# Patient Record
Sex: Female | Born: 1945 | Race: White | Hispanic: No | Marital: Married
Health system: Midwestern US, Community
[De-identification: ages and names within clinical notes are randomized; demographics above are authoritative.]

## PROBLEM LIST (undated history)

## (undated) DIAGNOSIS — C649 Malignant neoplasm of unspecified kidney, except renal pelvis: Secondary | ICD-10-CM

## (undated) DIAGNOSIS — N200 Calculus of kidney: Secondary | ICD-10-CM

## (undated) DIAGNOSIS — E042 Nontoxic multinodular goiter: Secondary | ICD-10-CM

## (undated) DIAGNOSIS — E139 Other specified diabetes mellitus without complications: Secondary | ICD-10-CM

## (undated) DIAGNOSIS — N3941 Urge incontinence: Principal | ICD-10-CM

## (undated) DIAGNOSIS — L659 Nonscarring hair loss, unspecified: Secondary | ICD-10-CM

## (undated) DIAGNOSIS — N644 Mastodynia: Secondary | ICD-10-CM

## (undated) DIAGNOSIS — R4589 Other symptoms and signs involving emotional state: Principal | ICD-10-CM

## (undated) DIAGNOSIS — E782 Mixed hyperlipidemia: Secondary | ICD-10-CM

## (undated) DIAGNOSIS — Z1231 Encounter for screening mammogram for malignant neoplasm of breast: Secondary | ICD-10-CM

## (undated) DIAGNOSIS — I10 Essential (primary) hypertension: Principal | ICD-10-CM

## (undated) DIAGNOSIS — I714 Abdominal aortic aneurysm, without rupture, unspecified: Secondary | ICD-10-CM

## (undated) DIAGNOSIS — F32A Depression, unspecified: Secondary | ICD-10-CM

## (undated) DIAGNOSIS — N649 Disorder of breast, unspecified: Secondary | ICD-10-CM

## (undated) DIAGNOSIS — Z13820 Encounter for screening for osteoporosis: Secondary | ICD-10-CM

## (undated) DIAGNOSIS — G44209 Tension-type headache, unspecified, not intractable: Secondary | ICD-10-CM

## (undated) DIAGNOSIS — E039 Hypothyroidism, unspecified: Secondary | ICD-10-CM

## (undated) DIAGNOSIS — Z136 Encounter for screening for cardiovascular disorders: Secondary | ICD-10-CM

## (undated) DIAGNOSIS — F419 Anxiety disorder, unspecified: Secondary | ICD-10-CM

## (undated) DIAGNOSIS — R109 Unspecified abdominal pain: Secondary | ICD-10-CM

## (undated) DIAGNOSIS — N390 Urinary tract infection, site not specified: Secondary | ICD-10-CM

## (undated) DIAGNOSIS — E119 Type 2 diabetes mellitus without complications: Principal | ICD-10-CM

## (undated) DIAGNOSIS — N6452 Nipple discharge: Principal | ICD-10-CM

## (undated) DIAGNOSIS — K2100 Gastro-esophageal reflux disease with esophagitis, without bleeding: Secondary | ICD-10-CM

## (undated) DIAGNOSIS — E0801 Diabetes mellitus due to underlying condition with hyperosmolarity with coma: Principal | ICD-10-CM

## (undated) DIAGNOSIS — R071 Chest pain on breathing: Secondary | ICD-10-CM

## (undated) DIAGNOSIS — F329 Major depressive disorder, single episode, unspecified: Secondary | ICD-10-CM

## (undated) DIAGNOSIS — R1011 Right upper quadrant pain: Secondary | ICD-10-CM

## (undated) DIAGNOSIS — I7143 Infrarenal abdominal aortic aneurysm, without rupture: Secondary | ICD-10-CM

## (undated) DIAGNOSIS — Z9889 Other specified postprocedural states: Principal | ICD-10-CM

## (undated) DIAGNOSIS — R52 Pain, unspecified: Secondary | ICD-10-CM

## (undated) DIAGNOSIS — E559 Vitamin D deficiency, unspecified: Secondary | ICD-10-CM

## (undated) DIAGNOSIS — R0602 Shortness of breath: Secondary | ICD-10-CM

## (undated) DIAGNOSIS — Z1211 Encounter for screening for malignant neoplasm of colon: Secondary | ICD-10-CM

## (undated) DIAGNOSIS — Z139 Encounter for screening, unspecified: Secondary | ICD-10-CM

## (undated) DIAGNOSIS — M549 Dorsalgia, unspecified: Secondary | ICD-10-CM

## (undated) DIAGNOSIS — R002 Palpitations: Principal | ICD-10-CM

## (undated) DIAGNOSIS — R609 Edema, unspecified: Secondary | ICD-10-CM

## (undated) DIAGNOSIS — K589 Irritable bowel syndrome without diarrhea: Principal | ICD-10-CM

## (undated) DIAGNOSIS — E789 Disorder of lipoprotein metabolism, unspecified: Secondary | ICD-10-CM

## (undated) HISTORY — DX: Calculus of kidney: N20.0

## (undated) HISTORY — PX: OTHER SURGICAL HISTORY: SHX169

## (undated) HISTORY — DX: Nontoxic multinodular goiter: E04.2

## (undated) HISTORY — PX: LITHOTRIPSY: SUR834

## (undated) HISTORY — DX: Malignant neoplasm of unspecified kidney, except renal pelvis: C64.9

## (undated) HISTORY — PX: LAPAROSCOPIC CHOLECYSTECTOMY: SUR755

## (undated) SURGERY — OPEN REDUCTION INTERNAL FIXATION (ORIF) ANKLE FRACTURE
Anesthesia: General | Laterality: Left

---

## 1998-04-19 ENCOUNTER — Inpatient Hospital Stay (HOSPITAL_COMMUNITY): Admission: RE | Admit: 1998-04-19 | Discharge: 1998-04-21 | Payer: Self-pay | Admitting: Obstetrics and Gynecology

## 1999-04-02 ENCOUNTER — Other Ambulatory Visit: Admission: RE | Admit: 1999-04-02 | Discharge: 1999-04-02 | Payer: Self-pay | Admitting: Obstetrics and Gynecology

## 2000-04-16 ENCOUNTER — Other Ambulatory Visit: Admission: RE | Admit: 2000-04-16 | Discharge: 2000-04-16 | Payer: Self-pay | Admitting: Obstetrics and Gynecology

## 2000-05-14 ENCOUNTER — Encounter: Admission: RE | Admit: 2000-05-14 | Discharge: 2000-05-14 | Payer: Self-pay | Admitting: Obstetrics and Gynecology

## 2000-05-14 ENCOUNTER — Encounter: Payer: Self-pay | Admitting: Obstetrics and Gynecology

## 2001-05-11 ENCOUNTER — Other Ambulatory Visit: Admission: RE | Admit: 2001-05-11 | Discharge: 2001-05-11 | Payer: Self-pay | Admitting: Obstetrics and Gynecology

## 2001-05-29 ENCOUNTER — Encounter: Payer: Self-pay | Admitting: Obstetrics and Gynecology

## 2001-05-29 ENCOUNTER — Encounter: Admission: RE | Admit: 2001-05-29 | Discharge: 2001-05-29 | Payer: Self-pay | Admitting: Obstetrics and Gynecology

## 2001-09-26 ENCOUNTER — Emergency Department (HOSPITAL_COMMUNITY): Admission: EM | Admit: 2001-09-26 | Discharge: 2001-09-26 | Payer: Self-pay

## 2001-10-02 ENCOUNTER — Observation Stay (HOSPITAL_COMMUNITY): Admission: RE | Admit: 2001-10-02 | Discharge: 2001-10-03 | Payer: Self-pay | Admitting: *Deleted

## 2001-10-02 ENCOUNTER — Encounter (INDEPENDENT_AMBULATORY_CARE_PROVIDER_SITE_OTHER): Payer: Self-pay | Admitting: Specialist

## 2002-07-05 ENCOUNTER — Encounter: Admission: RE | Admit: 2002-07-05 | Discharge: 2002-07-05 | Payer: Self-pay | Admitting: Obstetrics and Gynecology

## 2002-07-05 ENCOUNTER — Encounter: Payer: Self-pay | Admitting: Obstetrics and Gynecology

## 2003-09-07 ENCOUNTER — Encounter: Admission: RE | Admit: 2003-09-07 | Discharge: 2003-09-07 | Payer: Self-pay | Admitting: Obstetrics and Gynecology

## 2005-02-13 ENCOUNTER — Encounter: Admission: RE | Admit: 2005-02-13 | Discharge: 2005-02-13 | Payer: Self-pay | Admitting: Obstetrics and Gynecology

## 2005-02-20 ENCOUNTER — Encounter (INDEPENDENT_AMBULATORY_CARE_PROVIDER_SITE_OTHER): Payer: Self-pay | Admitting: *Deleted

## 2005-02-20 ENCOUNTER — Encounter: Admission: RE | Admit: 2005-02-20 | Discharge: 2005-02-20 | Payer: Self-pay | Admitting: Obstetrics and Gynecology

## 2005-02-20 HISTORY — PX: BREAST BIOPSY: SHX20

## 2006-04-03 ENCOUNTER — Encounter: Admission: RE | Admit: 2006-04-03 | Discharge: 2006-04-03 | Payer: Self-pay | Admitting: Obstetrics and Gynecology

## 2007-05-28 ENCOUNTER — Encounter: Admission: RE | Admit: 2007-05-28 | Discharge: 2007-05-28 | Payer: Self-pay | Admitting: Obstetrics and Gynecology

## 2008-06-28 ENCOUNTER — Encounter: Admission: RE | Admit: 2008-06-28 | Discharge: 2008-06-28 | Payer: Self-pay | Admitting: Gynecology

## 2009-12-25 ENCOUNTER — Ambulatory Visit (HOSPITAL_COMMUNITY): Admission: RE | Admit: 2009-12-25 | Discharge: 2009-12-25 | Payer: Self-pay | Admitting: Urology

## 2010-02-09 ENCOUNTER — Inpatient Hospital Stay (HOSPITAL_COMMUNITY): Admission: EM | Admit: 2010-02-09 | Discharge: 2010-02-09 | Payer: Self-pay | Admitting: Emergency Medicine

## 2010-02-12 ENCOUNTER — Inpatient Hospital Stay (HOSPITAL_COMMUNITY): Admission: RE | Admit: 2010-02-12 | Discharge: 2010-02-14 | Payer: Self-pay | Admitting: Urology

## 2010-02-12 ENCOUNTER — Encounter (INDEPENDENT_AMBULATORY_CARE_PROVIDER_SITE_OTHER): Payer: Self-pay | Admitting: Urology

## 2010-09-05 ENCOUNTER — Ambulatory Visit (HOSPITAL_COMMUNITY): Admission: RE | Admit: 2010-09-05 | Discharge: 2010-09-05 | Payer: Self-pay | Admitting: Urology

## 2010-11-18 ENCOUNTER — Encounter: Payer: Self-pay | Admitting: Gynecology

## 2010-12-14 LAB — LIPID PANEL
Chol/HDL Ratio: 6.3 — ABNORMAL HIGH (ref <5.0)
Cholesterol: 227 mg/dL — ABNORMAL HIGH (ref <200)
HDL: 36 mg/dL — ABNORMAL LOW (ref >40)
LDL Cholesterol: 129 mg/dL — ABNORMAL HIGH (ref <100)
Triglycerides: 310 mg/dL — ABNORMAL HIGH (ref <150)

## 2010-12-14 LAB — CBC
Hematocrit: 41.5 % (ref 36–46)
Hemoglobin: 13.7 g/dL (ref 12.0–16.0)
MCH: 28.9 pg (ref 26–34)
MCHC: 33.1 g/dL (ref 31–37)
MCV: 87.3 fL (ref 80–100)
MPV: 9.1 fL (ref 6.0–12.0)
Platelet Count: 264 10*3/uL (ref 140–450)
RBC: 4.75 m/uL (ref 4.0–5.2)
RDW: 14.5 % (ref 12.5–15.4)
WBC: 6.4 10*3/uL (ref 3.5–11.0)

## 2010-12-14 LAB — COMPREHENSIVE METABOLIC PANEL
ALT: 18 U/L (ref 4–40)
AST: 16 U/L (ref 8–36)
Albumin: 4.2 g/dL (ref 3.4–4.8)
Alkaline Phosphatase: 96 U/L (ref 25–100)
Anion Gap: 11 mmol/L (ref 8–16)
BUN: 16 mg/dL (ref 5–20)
CO2: 32 mmol/L — ABNORMAL HIGH (ref 20–31)
Calcium: 9.6 mg/dL (ref 8.6–10.4)
Chloride: 104 mmol/L (ref 98–110)
Creatinine: 0.7 mg/dL (ref 0.4–1.0)
GFR African American: >60 mL/min (ref >60)
GFR Non-African American: >60 mL/min (ref >60)
Glucose: 165 mg/dL — ABNORMAL HIGH (ref 74–106)
Potassium: 5.1 mmol/L (ref 3.5–5.1)
Protein, Total: 7.3 g/dL (ref 6.4–8.3)
Sodium: 142 mmol/L (ref 136–145)
Total Bilirubin: 0.4 mg/dL (ref 0.30–1.20)

## 2010-12-14 LAB — TSH: TSH: 1.75 mIU/L (ref 0.35–5.50)

## 2010-12-14 LAB — AMYLASE: Amylase: 24 U/L (ref 20–104)

## 2010-12-14 LAB — HEMOGLOBIN A1C

## 2010-12-14 LAB — T4, FREE: Thyroxine, Free: 1.15 ng/dL (ref 0.80–2.00)

## 2010-12-14 LAB — LIPASE: Lipase: 26 U/L (ref 5.6–51.3)

## 2010-12-15 LAB — GLYCOSYLATED HGB
A1c: 7 % — ABNORMAL HIGH (ref 4.0–5.6)
Estimated Avg Glucose: 154 mg/dL

## 2010-12-16 LAB — CREATININE
Creatinine: 0.72 mg/dL (ref 0.4–1.0)
GFR African American: >60 mL/min (ref >60)
GFR Non-African American: >60 mL/min (ref >60)

## 2011-01-15 LAB — CBC
HCT: 38.6 % (ref 36.0–46.0)
MCV: 88.2 fL (ref 78.0–100.0)
Platelets: 219 10*3/uL (ref 150–400)
WBC: 9.2 10*3/uL (ref 4.0–10.5)

## 2011-01-15 LAB — HEMOGLOBIN
Hemoglobin: 12.3 g/dL (ref 12.0–15.0)
Hemoglobin: 12.4 g/dL (ref 12.0–15.0)

## 2011-01-15 LAB — HEMATOCRIT: HCT: 36.8 % (ref 36.0–46.0)

## 2011-01-15 LAB — LIPID PANEL
Cholesterol: 197 mg/dL (ref 0–200)
LDL Cholesterol: 127 mg/dL — ABNORMAL HIGH (ref 0–99)
Triglycerides: 121 mg/dL (ref <150)
VLDL: 24 mg/dL (ref 0–40)

## 2011-01-15 LAB — BASIC METABOLIC PANEL
BUN: 22 mg/dL (ref 6–23)
CO2: 26 mEq/L (ref 19–32)
CO2: 27 mEq/L (ref 19–32)
Calcium: 9.4 mg/dL (ref 8.4–10.5)
Calcium: 8.6 mg/dL (ref 8.4–10.5)
Chloride: 107 mEq/L (ref 96–112)
Chloride: 110 mEq/L (ref 96–112)
Chloride: 113 mEq/L — ABNORMAL HIGH (ref 96–112)
GFR calc Af Amer: >60 mL/min (ref >60)
GFR calc Af Amer: >60 mL/min (ref >60)
GFR calc Af Amer: >60 mL/min (ref >60)
GFR calc non Af Amer: 56 mL/min — ABNORMAL LOW (ref >60)
GFR calc non Af Amer: 60 mL/min — ABNORMAL LOW (ref >60)
Potassium: 4.4 mEq/L (ref 3.5–5.1)
Potassium: 4.1 mEq/L (ref 3.5–5.1)
Sodium: 143 mEq/L (ref 135–145)

## 2011-01-15 LAB — CARDIAC PANEL(CRET KIN+CKTOT+MB+TROPI)
CK, MB: 17.1 ng/mL (ref 0.3–4.0)
Relative Index: INVALID (ref 0.0–2.5)
Total CK: 94 U/L (ref 7–177)
Troponin I: 1.48 ng/mL (ref 0.00–0.06)

## 2011-01-15 LAB — BRAIN NATRIURETIC PEPTIDE: Pro B Natriuretic peptide (BNP): 167 pg/mL — ABNORMAL HIGH (ref 0.0–100.0)

## 2011-01-15 LAB — CREATININE, FLUID (PLEURAL, PERITONEAL, JP DRAINAGE): Creat, Fluid: 0.9 mg/dL

## 2011-01-15 LAB — HEMOGLOBIN AND HEMATOCRIT, BLOOD
HCT: 38.4 % (ref 36.0–46.0)
Hemoglobin: 13.1 g/dL (ref 12.0–15.0)

## 2011-01-15 LAB — HEMOGLOBIN A1C: Mean Plasma Glucose: 128 mg/dL — ABNORMAL HIGH (ref <117)

## 2011-01-15 LAB — MRSA PCR SCREENING: MRSA by PCR: NEGATIVE

## 2011-01-15 LAB — PROTIME-INR
INR: 1.12 (ref 0.00–1.49)
Prothrombin Time: 14.3 seconds (ref 11.6–15.2)

## 2011-01-16 LAB — BASIC METABOLIC PANEL
CO2: 29 mEq/L (ref 19–32)
Calcium: 9.6 mg/dL (ref 8.4–10.5)
Chloride: 106 mEq/L (ref 96–112)
Creatinine, Ser: 1.19 mg/dL (ref 0.4–1.2)
GFR calc Af Amer: 56 mL/min — ABNORMAL LOW (ref >60)
GFR calc Af Amer: >60 mL/min (ref >60)
GFR calc non Af Amer: 46 mL/min — ABNORMAL LOW (ref >60)
Glucose, Bld: 108 mg/dL — ABNORMAL HIGH (ref 70–99)
Glucose, Bld: 77 mg/dL (ref 70–99)
Potassium: 4.1 mEq/L (ref 3.5–5.1)
Sodium: 142 mEq/L (ref 135–145)
Sodium: 142 mEq/L (ref 135–145)

## 2011-01-16 LAB — POCT CARDIAC MARKERS
CKMB, poc: 3.7 ng/mL (ref 1.0–8.0)
Myoglobin, poc: 184 ng/mL (ref 12–200)
Troponin i, poc: 0.14 ng/mL — ABNORMAL HIGH (ref 0.00–0.09)

## 2011-01-16 LAB — DIFFERENTIAL
Basophils Absolute: 0 10*3/uL (ref 0.0–0.1)
Lymphocytes Relative: 13 % (ref 12–46)
Monocytes Absolute: 0.5 10*3/uL (ref 0.1–1.0)
Neutro Abs: 9.1 10*3/uL — ABNORMAL HIGH (ref 1.7–7.7)
Neutrophils Relative %: 82 % — ABNORMAL HIGH (ref 43–77)

## 2011-01-16 LAB — CBC
HCT: 42.1 % (ref 36.0–46.0)
Hemoglobin: 14.1 g/dL (ref 12.0–15.0)
Hemoglobin: 14.4 g/dL (ref 12.0–15.0)
MCHC: 34.2 g/dL (ref 30.0–36.0)
MCV: 88.2 fL (ref 78.0–100.0)
RBC: 4.78 MIL/uL (ref 3.87–5.11)
RDW: 13.7 % (ref 11.5–15.5)
RDW: 13.6 % (ref 11.5–15.5)

## 2011-01-16 LAB — CK TOTAL AND CKMB (NOT AT ARMC): CK, MB: 10.5 ng/mL (ref 0.3–4.0)

## 2011-01-16 LAB — MAGNESIUM: Magnesium: 2.2 mg/dL (ref 1.5–2.5)

## 2011-03-14 ENCOUNTER — Other Ambulatory Visit (HOSPITAL_COMMUNITY): Payer: Self-pay | Admitting: Urology

## 2011-03-14 DIAGNOSIS — E041 Nontoxic single thyroid nodule: Secondary | ICD-10-CM

## 2011-03-15 NOTE — Op Note (Signed)
[facility]  Patient:    Kara Buchanan, Kara Buchanan Visit Number: [id number] MRN: [id number]          Service Type: SUR Location: 3W 0357 01 Attending Physician:  Vikki Ports Dictated by:   Vikki Ports, M.D. Proc. Date: 10/02/01 Admit Date:  10/02/2001 Discharge Date: 10/03/2001                             Operative Report  PREOPERATIVE DIAGNOSIS:  Acute cholecystitis.  POSTOPERATIVE DIAGNOSIS:  Acute cholecystitis.  PROCEDURE:  Laparoscopic cholecystectomy.  SURGEON:  Vikki Ports, M.D.  ASSISTANT:  Ma Hillock, R.N.F.A.  ANESTHESIA:  General.  DESCRIPTION OF PROCEDURE:  The patient was taken to the operating room, placed in the supine position and after adequate anesthesia was induced using an endotracheal tube, the abdomen was prepped and draped in the normal sterile fashion. Using a transverse infraumbilical incision, I dissected down to the fascia. This was opened vertically. The peritoneum was entered without difficulty. A #0 Vicryl pursestring suture was placed around the fascial defect. A Hasson trocar was placed in the abdomen and was insufflated with continuous flow carbon dioxide to a constant pressure of 15 mmHg. Under direct visualization, a 10 mm port was placed in the subxiphoid region and two 5 mm ports were placed in the right abdomen. The gallbladder was identified and retracted cephalad. The gallbladder was very edematous and therefore cyst aspirator was placed and a large amount of clear bile was then suctioned. That gave me better purchase on the gallbladder which was then retracted further cephalad. The cystic duct which was quite broad based but was an obvious attachment to the gallbladder was dissected free of surrounding structures, triply clipped and divided. Because of the extension of edema under the cystic duct, I opted to put an endoloop around the stump as well. The gallbladder was the  removed from the gallbladder bed with a very difficult dissection secondary to edema. There is a great amount of bleeding in the gallbladder bed which was coagulated and controlled with Surgicel. Adequate hemostasis was completely ensured. The gallbladder was placed in an endocatch bag and removed through the umbilical port. A 19 Blake drain was then placed through the lateral trocar and placed in the gallbladder bed. This was sutured in place. All trocars were removed under direct visualization and no bleeding was noted. The infraumbilical fascial defect was closed with a previously placed #0 Vicryl pursestring suture. The skin incisions were closed with subcuticular 4-0 monocryl, Steri-Strips and sterile dressings were applied. The patient tolerated the procedure well and went to PACU in good condition. Dictated by:   Vikki Ports, M.D. Attending Physician:  Danna Hefty R. DD:  10/02/01 TD:  10/04/01 Job: [id number]

## 2011-03-19 ENCOUNTER — Ambulatory Visit (HOSPITAL_COMMUNITY)
Admission: RE | Admit: 2011-03-19 | Discharge: 2011-03-19 | Disposition: A | Discharge status: Home / Self Care | Payer: 59 | Source: Ambulatory Visit | Attending: Urology | Admitting: Urology

## 2011-03-19 DIAGNOSIS — E041 Nontoxic single thyroid nodule: Secondary | ICD-10-CM

## 2011-03-19 DIAGNOSIS — E042 Nontoxic multinodular goiter: Secondary | ICD-10-CM | POA: Insufficient documentation

## 2011-03-22 LAB — HEPATIC FUNCTION PANEL
ALT: 19 U/L (ref 4–40)
AST: 18 U/L (ref 8–36)
Albumin: 4.2 g/dL (ref 3.4–4.8)
Alkaline Phosphatase: 98 U/L (ref 25–100)
Bilirubin, Direct: 0.12 mg/dL (ref 0.0–0.3)
Bilirubin, Indirect: 0.19 mg/dL (ref 0.0–1.0)
Protein, Total: 7.1 g/dL (ref 6.4–8.3)
Total Bilirubin: 0.31 mg/dL (ref 0.3–1.2)

## 2011-03-22 LAB — CREATININE
Creatinine: 0.7 mg/dL (ref 0.4–1.0)
GFR African American: >60 mL/min (ref >60)
GFR Non-African American: >60 mL/min (ref >60)

## 2011-03-22 LAB — BUN: BUN: 12 mg/dL (ref 5–20)

## 2011-03-27 ENCOUNTER — Other Ambulatory Visit (INDEPENDENT_AMBULATORY_CARE_PROVIDER_SITE_OTHER): Payer: Self-pay | Admitting: Surgery

## 2011-03-27 DIAGNOSIS — E041 Nontoxic single thyroid nodule: Secondary | ICD-10-CM

## 2011-03-29 ENCOUNTER — Ambulatory Visit
Admission: RE | Admit: 2011-03-29 | Discharge: 2011-03-29 | Disposition: A | Discharge status: Home / Self Care | Payer: 59 | Source: Ambulatory Visit | Attending: Surgery | Admitting: Surgery

## 2011-03-29 ENCOUNTER — Other Ambulatory Visit (HOSPITAL_COMMUNITY)
Admission: RE | Admit: 2011-03-29 | Discharge: 2011-03-29 | Disposition: A | Discharge status: Home / Self Care | Payer: 59 | Source: Ambulatory Visit | Attending: Interventional Radiology | Admitting: Interventional Radiology

## 2011-03-29 ENCOUNTER — Other Ambulatory Visit: Payer: Self-pay | Admitting: Interventional Radiology

## 2011-03-29 DIAGNOSIS — E049 Nontoxic goiter, unspecified: Secondary | ICD-10-CM | POA: Insufficient documentation

## 2011-03-29 DIAGNOSIS — E041 Nontoxic single thyroid nodule: Secondary | ICD-10-CM

## 2011-04-18 ENCOUNTER — Other Ambulatory Visit (INDEPENDENT_AMBULATORY_CARE_PROVIDER_SITE_OTHER): Payer: Self-pay | Admitting: Surgery

## 2011-04-18 DIAGNOSIS — E042 Nontoxic multinodular goiter: Secondary | ICD-10-CM

## 2011-09-03 ENCOUNTER — Ambulatory Visit (HOSPITAL_COMMUNITY)
Admission: RE | Admit: 2011-09-03 | Discharge: 2011-09-03 | Disposition: A | Discharge status: Home / Self Care | Payer: 59 | Source: Ambulatory Visit | Attending: Urology | Admitting: Urology

## 2011-09-03 ENCOUNTER — Other Ambulatory Visit (HOSPITAL_COMMUNITY): Payer: Self-pay | Admitting: Urology

## 2011-09-03 DIAGNOSIS — C649 Malignant neoplasm of unspecified kidney, except renal pelvis: Secondary | ICD-10-CM

## 2011-09-23 ENCOUNTER — Ambulatory Visit
Admission: RE | Admit: 2011-09-23 | Discharge: 2011-09-23 | Disposition: A | Discharge status: Home / Self Care | Payer: 59 | Source: Ambulatory Visit | Attending: Surgery | Admitting: Surgery

## 2011-09-23 DIAGNOSIS — E042 Nontoxic multinodular goiter: Secondary | ICD-10-CM

## 2011-10-31 ENCOUNTER — Encounter (INDEPENDENT_AMBULATORY_CARE_PROVIDER_SITE_OTHER): Payer: Self-pay | Admitting: Surgery

## 2011-11-05 ENCOUNTER — Ambulatory Visit (INDEPENDENT_AMBULATORY_CARE_PROVIDER_SITE_OTHER): Payer: 59 | Admitting: Surgery

## 2011-11-05 ENCOUNTER — Encounter (INDEPENDENT_AMBULATORY_CARE_PROVIDER_SITE_OTHER): Payer: Self-pay | Admitting: Surgery

## 2011-11-05 VITALS — BP 144/86 | HR 76 | Temp 98.1°F | Resp 16 | Ht 67.0 in | Wt 205.2 lb

## 2011-11-05 DIAGNOSIS — E042 Nontoxic multinodular goiter: Secondary | ICD-10-CM | POA: Insufficient documentation

## 2011-11-05 NOTE — Progress Notes (Signed)
Visit Diagnoses: 1. Multinodular goiter (nontoxic)    HISTORY: The patient is a 66 year old white female followed for bilateral thyroid nodules. Dominant nodule on the right measures 3.2 cm and was biopsied this past year. This showed benign cytopathology. 1.3 cm nodule on the left is stable in size. Followup ultrasound from November 2012 showed slight interval enlargement of the right thyroid nodule and stable left thyroid nodule. Thyroid function tests from the summer of 2012 were normal.  Over the past months the patient has not noted any significant change in self-examination. She does have a mild globus sensation. She denies dysphagia.  PERTINENT REVIEW OF SYSTEMS: No tremor. No palpitations. Mild globus sensation. No dysphagia. No dyspnea.  EXAM: HEENT: normocephalic; pupils equal and reactive; sclerae clear; dentition good; mucous membranes moist NECK:  Dominant nodule on right approx 3 cm; symmetric on extension; no palpable anterior or posterior cervical lymphadenopathy; no supraclavicular masses; no tenderness CHEST: clear to auscultation bilaterally without rales, rhonchi, or wheezes CARDIAC: regular rate and rhythm without significant murmur; peripheral pulses are full EXT:  non-tender without edema; no deformity NEURO: no gross focal deficits; no sign of tremor   IMPRESSION: Multinodular thyroid goiter, dominant right thyroid nodule, slight interval enlargement, benign cytopathology  PLAN: Patient and I discussed options for management. At present I think it is safe to continue to monitor the bilateral thyroid nodules. We will repeat a thyroid ultrasound and a TSH level in one year. She will return following those studies for physical examination and review of the results. Overall I feel these are low risk lesions.  Velora Heckler, MD, FACS General & Endocrine Surgery [facility]

## 2012-01-28 LAB — CBC WITH AUTO DIFFERENTIAL
Absolute Eos #: 0.25 10*3/uL (ref 0.0–0.4)
Absolute Lymph #: 1.78 10*3/uL (ref 1.0–4.8)
Absolute Mono #: 0.57 10*3/uL (ref 0.2–0.8)
Absolute Neut #: 5.85 10*3/uL (ref 1.8–7.7)
Basophils Absolute: 0.05 10*3/uL (ref 0.0–0.2)
Basophils: 1 % (ref 0–2)
Eosinophils %: 3 % (ref 1–4)
Hematocrit: 42.7 % (ref 36–46)
Hemoglobin: 13.7 g/dL (ref 12.0–16.0)
Lymphocytes: 21 % — ABNORMAL LOW (ref 24–44)
MCH: 28.1 pg (ref 26–34)
MCHC: 32 g/dL (ref 31–37)
MCV: 87.9 fL (ref 80–100)
MPV: 8.1 fL (ref 6.0–12.0)
Monocytes: 7 % (ref 1–7)
Platelets: 260 10*3/uL (ref 130–400)
RBC: 4.86 m/uL (ref 4.0–5.2)
RDW: 14.5 % (ref 11.5–14.5)
Seg Neutrophils: 68 % — ABNORMAL HIGH (ref 36–66)
WBC: 8.5 10*3/uL (ref 3.5–11.0)

## 2012-01-28 LAB — ELECTROLYTE PANEL
Anion Gap: 9 mmol/L (ref 8–16)
CO2: 33 mmol/L — ABNORMAL HIGH (ref 20–31)
Chloride: 102 mmol/L (ref 98–110)
Potassium: 4.3 mmol/L (ref 3.5–5.1)
Sodium: 139 mmol/L (ref 136–145)

## 2012-01-28 LAB — GLUCOSE, RANDOM: Glucose: 196 mg/dL — ABNORMAL HIGH (ref 74–106)

## 2012-01-28 LAB — CREATININE
Creatinine: 0.72 mg/dL (ref 0.4–1.0)
GFR African American: >60 mL/min (ref >60)
GFR Non-African American: >60 mL/min (ref >60)

## 2012-01-28 LAB — TYPE AND SCREEN
ABO/Rh: A POS
Antibody Screen: NEGATIVE

## 2012-01-28 LAB — BUN: BUN: 11 mg/dL (ref 5–20)

## 2012-01-29 LAB — MRSA DNA PROBE, NASAL: Specimen: 43623

## 2012-03-19 ENCOUNTER — Other Ambulatory Visit (HOSPITAL_COMMUNITY): Payer: Self-pay | Admitting: Urology

## 2012-03-19 ENCOUNTER — Ambulatory Visit (HOSPITAL_COMMUNITY)
Admission: RE | Admit: 2012-03-19 | Discharge: 2012-03-19 | Disposition: A | Discharge status: Home / Self Care | Payer: 59 | Source: Ambulatory Visit | Attending: Urology | Admitting: Urology

## 2012-03-19 DIAGNOSIS — C649 Malignant neoplasm of unspecified kidney, except renal pelvis: Secondary | ICD-10-CM

## 2012-05-26 LAB — CBC WITH AUTO DIFFERENTIAL
Absolute Eos #: 0.16 10*3/uL (ref 0.0–0.4)
Absolute Lymph #: 2 10*3/uL (ref 1.0–4.8)
Absolute Mono #: 0.4 10*3/uL (ref 0.2–0.8)
Basophils Absolute: 0.07 10*3/uL (ref 0.0–0.2)
Basophils: 1 % (ref 0–2)
Eosinophils %: 2 % (ref 1–4)
Hematocrit: 40.5 % (ref 36–46)
Hemoglobin: 13.1 g/dL (ref 12.0–16.0)
Lymphocytes: 28 % (ref 24–44)
MCH: 28.4 pg (ref 26–34)
MCHC: 32.2 g/dL (ref 31–37)
MCV: 88.1 fL (ref 80–100)
MPV: 8.4 fL (ref 6.0–12.0)
Monocytes: 6 % (ref 1–7)
Platelets: 240 10*3/uL (ref 130–400)
RBC: 4.6 m/uL (ref 4.0–5.2)
RDW: 13.8 % (ref 11.5–14.5)
Seg Neutrophils: 63 % (ref 36–66)
Segs Absolute: 4.66 10*3/uL (ref 1.8–7.7)
WBC: 7.3 10*3/uL (ref 3.5–11.0)

## 2012-05-26 LAB — ELECTROLYTE PANEL
Anion Gap: 13 mmol/L (ref 8–16)
CO2: 31 mmol/L (ref 20–31)
Chloride: 100 mmol/L (ref 98–110)
Potassium: 4.6 mmol/L (ref 3.5–5.1)
Sodium: 139 mmol/L (ref 136–145)

## 2012-05-26 LAB — CREATININE
Creatinine: 0.75 mg/dL (ref 0.4–1.0)
GFR African American: >60 mL/min (ref >60)
GFR Non-African American: >60 mL/min (ref >60)

## 2012-05-26 LAB — BUN: BUN: 9 mg/dL (ref 5–20)

## 2012-05-26 LAB — GLUCOSE, RANDOM: Glucose: 162 mg/dL — ABNORMAL HIGH (ref 74–106)

## 2012-05-26 LAB — TYPE AND SCREEN
ABO/Rh: A POS
Antibody Screen: NEGATIVE

## 2012-05-27 LAB — MRSA DNA PROBE, NASAL: Specimen: 43623

## 2012-06-09 ENCOUNTER — Inpatient Hospital Stay
Admit: 2012-06-09 | Disposition: A | Discharge status: Home Health | Payer: BLUE CROSS/BLUE SHIELD | Attending: Orthopaedic Surgery | Admitting: Orthopaedic Surgery

## 2012-06-09 LAB — POC GLUCOSE FINGERSTICK
POC Glucose: 169 mg/dL — ABNORMAL HIGH (ref 65–105)
POC Glucose: 230 mg/dL — ABNORMAL HIGH (ref 65–105)
POC Glucose: 242 mg/dL — ABNORMAL HIGH (ref 65–105)

## 2012-06-10 LAB — CBC
Hematocrit: 31.3 % — ABNORMAL LOW (ref 36–46)
Hemoglobin: 10.2 g/dL — ABNORMAL LOW (ref 12.0–16.0)
MCH: 28.5 pg (ref 26–34)
MCHC: 32.5 g/dL (ref 31–37)
MCV: 87.8 fL (ref 80–100)
MPV: 8.3 fL (ref 6.0–12.0)
Platelets: 202 10*3/uL (ref 130–400)
RBC: 3.56 m/uL — ABNORMAL LOW (ref 4.0–5.2)
RDW: 13.4 % (ref 11.5–14.5)
WBC: 9.1 10*3/uL (ref 3.5–11.0)

## 2012-06-10 LAB — POC GLUCOSE FINGERSTICK
POC Glucose: 284 mg/dL — ABNORMAL HIGH (ref 65–105)
POC Glucose: 151 mg/dL — ABNORMAL HIGH (ref 65–105)
POC Glucose: 260 mg/dL — ABNORMAL HIGH (ref 65–105)
POC Glucose: 139 mg/dL — ABNORMAL HIGH (ref 65–105)

## 2012-06-10 LAB — COMPREHENSIVE METABOLIC PANEL
ALT: 17 U/L (ref 4–40)
AST: 16 U/L (ref 8–36)
Albumin: 3.3 g/dL — ABNORMAL LOW (ref 3.4–4.8)
Alkaline Phosphatase: 73 U/L (ref 25–100)
Anion Gap: 8 mmol/L (ref 8–16)
BUN: 12 mg/dL (ref 5–20)
Bun/Cre Ratio: 17 (ref 9–20)
CO2: 32 mmol/L — ABNORMAL HIGH (ref 20–31)
Calcium: 8.5 mg/dL — ABNORMAL LOW (ref 8.6–10.4)
Chloride: 106 mmol/L (ref 98–110)
Creatinine: 0.69 mg/dL (ref 0.4–1.0)
GFR African American: >60 mL/min (ref >60)
GFR Non-African American: >60 mL/min (ref >60)
Glucose: 161 mg/dL — ABNORMAL HIGH (ref 74–106)
Potassium: 4.3 mmol/L (ref 3.5–5.1)
Sodium: 142 mmol/L (ref 136–145)
Total Bilirubin: 0.3 mg/dL (ref 0.30–1.20)
Total Protein: 5.7 g/dL — ABNORMAL LOW (ref 6.4–8.3)

## 2012-06-10 LAB — T4, FREE: Thyroxine, Free: 1.2 ng/dL (ref 0.80–2.00)

## 2012-06-10 LAB — TSH: TSH: 0.98 mIU/L (ref 0.35–5.50)

## 2012-06-10 LAB — MAGNESIUM: Magnesium: 1.8 mg/dL (ref 1.5–2.5)

## 2012-06-10 LAB — HEMOGLOBIN A1C

## 2012-06-11 LAB — COMPREHENSIVE METABOLIC PANEL
ALT: 16 U/L (ref 4–40)
AST: 17 U/L (ref 8–36)
Albumin: 3.4 g/dL (ref 3.4–4.8)
Alkaline Phosphatase: 79 U/L (ref 25–100)
Anion Gap: 11 mmol/L (ref 8–16)
BUN: 10 mg/dL (ref 5–20)
Bun/Cre Ratio: 17 (ref 9–20)
CO2: 32 mmol/L — ABNORMAL HIGH (ref 20–31)
Calcium: 8.4 mg/dL — ABNORMAL LOW (ref 8.6–10.4)
Chloride: 101 mmol/L (ref 98–110)
Creatinine: 0.59 mg/dL (ref 0.4–1.0)
GFR African American: >60 mL/min (ref >60)
GFR Non-African American: >60 mL/min (ref >60)
Glucose: 221 mg/dL — ABNORMAL HIGH (ref 74–106)
Potassium: 4.1 mmol/L (ref 3.5–5.1)
Sodium: 139 mmol/L (ref 136–145)
Total Bilirubin: 0.4 mg/dL (ref 0.30–1.20)
Total Protein: 5.9 g/dL — ABNORMAL LOW (ref 6.4–8.3)

## 2012-06-11 LAB — POC GLUCOSE FINGERSTICK
POC Glucose: 232 mg/dL — ABNORMAL HIGH (ref 65–105)
POC Glucose: 217 mg/dL — ABNORMAL HIGH (ref 65–105)
POC Glucose: 211 mg/dL — ABNORMAL HIGH (ref 65–105)
POC Glucose: 180 mg/dL — ABNORMAL HIGH (ref 65–105)

## 2012-06-11 LAB — HEMOGLOBIN AND HEMATOCRIT
Hematocrit: 31.2 % — ABNORMAL LOW (ref 36–46)
Hemoglobin: 10.1 g/dL — ABNORMAL LOW (ref 12.0–16.0)

## 2012-06-12 LAB — COMPREHENSIVE METABOLIC PANEL
ALT: 11 U/L (ref 4–40)
AST: 12 U/L (ref 8–36)
Albumin: 3.6 g/dL (ref 3.4–4.8)
Alkaline Phosphatase: 81 U/L (ref 25–100)
Anion Gap: 12 mmol/L (ref 8–16)
BUN: 7 mg/dL (ref 5–20)
Bun/Cre Ratio: 12 (ref 9–20)
CO2: 30 mmol/L (ref 20–31)
Calcium: 8.7 mg/dL (ref 8.6–10.4)
Chloride: 98 mmol/L (ref 98–110)
Creatinine: 0.61 mg/dL (ref 0.4–1.0)
GFR African American: >60 mL/min (ref >60)
GFR Non-African American: >60 mL/min (ref >60)
Glucose: 221 mg/dL — ABNORMAL HIGH (ref 74–106)
Potassium: 4.2 mmol/L (ref 3.5–5.1)
Sodium: 136 mmol/L (ref 136–145)
Total Bilirubin: 0.9 mg/dL (ref 0.30–1.20)
Total Protein: 6.1 g/dL — ABNORMAL LOW (ref 6.4–8.3)

## 2012-06-12 LAB — POC GLUCOSE FINGERSTICK
POC Glucose: 172 mg/dL — ABNORMAL HIGH (ref 65–105)
POC Glucose: 199 mg/dL — ABNORMAL HIGH (ref 65–105)
POC Glucose: 188 mg/dL — ABNORMAL HIGH (ref 65–105)
POC Glucose: 228 mg/dL — ABNORMAL HIGH (ref 65–105)

## 2012-06-12 LAB — GLYCOSYLATED HGB
Estimated Avg Glucose: 183 mg/dL
Hemoglobin A1C: 8 % — ABNORMAL HIGH (ref 4.0–5.6)

## 2012-07-15 LAB — HEPATIC FUNCTION PANEL
ALT: 21 U/L (ref 4–40)
AST: 17 U/L (ref 8–36)
Albumin/Globulin Ratio: 1.2 (ref 1.0–2.7)
Albumin: 4.1 g/dL (ref 3.4–4.8)
Alkaline Phosphatase: 97 U/L (ref 25–100)
Bilirubin, Direct: 0.11 mg/dL (ref 0.0–0.3)
Bilirubin, Indirect: 0.25 mg/dL (ref 0.0–1.0)
Total Bilirubin: 0.36 mg/dL (ref 0.3–1.2)
Total Protein: 7.4 g/dL (ref 6.4–8.3)

## 2012-07-15 LAB — BASIC METABOLIC PANEL
Anion Gap: 17 mmol/L — ABNORMAL HIGH (ref 8–16)
BUN: 10 mg/dL (ref 6–20)
CO2: 30 mmol/L (ref 20–31)
Calcium: 10 mg/dL (ref 8.6–10.4)
Chloride: 103 mmol/L (ref 98–110)
Creatinine: 0.7 mg/dL (ref 0.4–1.0)
GFR African American: >60 mL/min (ref >60)
GFR Non-African American: >60 mL/min (ref >60)
Glucose: 177 mg/dL — ABNORMAL HIGH (ref 74–106)
Potassium: 5.3 mmol/L — ABNORMAL HIGH (ref 3.5–5.1)
Sodium: 145 mmol/L (ref 136–145)

## 2012-07-15 LAB — LIPID PANEL
Chol/HDL Ratio: 5.6 — ABNORMAL HIGH (ref <5.0)
Cholesterol: 225 mg/dL — ABNORMAL HIGH (ref <200)
HDL: 40 mg/dL — ABNORMAL LOW (ref >40)
LDL Cholesterol: 139 mg/dL — ABNORMAL HIGH (ref <100)
Triglycerides: 231 mg/dL — ABNORMAL HIGH (ref <150)

## 2012-07-15 LAB — HEMOGLOBIN A1C
Estimated Avg Glucose: 117 mg/dL
Hemoglobin A1C: 5.7 % (ref 4.0–6.0)

## 2012-07-15 LAB — MICROALBUMIN, UR
Creatinine, Ur: 98 mg/dL
Microalb, Ur: <6 mg/L (ref 0–19)
Microalb/Crt. Ratio: 6 mcg/mg creat

## 2012-07-22 LAB — POTASSIUM: Potassium: 5.1 mmol/L (ref 3.5–5.1)

## 2012-09-04 LAB — RUBELLA ANTIBODY, IGG: Rubella Antibody, IgG: >500 IU/mL

## 2012-09-08 LAB — MUMPS ANTIBODY, IGG: Mumps IgG: 5.22 (ref >1.09)

## 2012-09-08 LAB — RUBEOLA ANTIBODY IGG: Measles Immune (Igg): 5.6 (ref >1.09)

## 2012-09-08 LAB — VARICELLA ZOSTER ANTIBODY, IGG: Varicella Zoster Ab IgG: 3.56 (ref >1.09)

## 2012-10-04 ENCOUNTER — Encounter (HOSPITAL_COMMUNITY): Payer: Self-pay | Admitting: Anesthesiology

## 2012-10-04 ENCOUNTER — Encounter (HOSPITAL_COMMUNITY)
Admission: EM | Disposition: A | Discharge status: Home / Self Care | Payer: Self-pay | Source: Home / Self Care | Attending: Emergency Medicine

## 2012-10-04 ENCOUNTER — Emergency Department (HOSPITAL_COMMUNITY)
Admission: EM | Admit: 2012-10-04 | Discharge: 2012-10-04 | Disposition: A | Discharge status: Home / Self Care | Payer: 59 | Attending: Emergency Medicine | Admitting: Emergency Medicine

## 2012-10-04 ENCOUNTER — Emergency Department (HOSPITAL_COMMUNITY): Payer: 59 | Admitting: Anesthesiology

## 2012-10-04 ENCOUNTER — Inpatient Hospital Stay: Admit: 2012-10-04 | Payer: Self-pay | Admitting: Orthopedic Surgery

## 2012-10-04 ENCOUNTER — Emergency Department (HOSPITAL_COMMUNITY): Payer: 59

## 2012-10-04 DIAGNOSIS — W010XXA Fall on same level from slipping, tripping and stumbling without subsequent striking against object, initial encounter: Secondary | ICD-10-CM | POA: Insufficient documentation

## 2012-10-04 DIAGNOSIS — E119 Type 2 diabetes mellitus without complications: Secondary | ICD-10-CM | POA: Insufficient documentation

## 2012-10-04 DIAGNOSIS — S82842A Displaced bimalleolar fracture of left lower leg, initial encounter for closed fracture: Secondary | ICD-10-CM

## 2012-10-04 DIAGNOSIS — S82843A Displaced bimalleolar fracture of unspecified lower leg, initial encounter for closed fracture: Secondary | ICD-10-CM | POA: Insufficient documentation

## 2012-10-04 DIAGNOSIS — Z85528 Personal history of other malignant neoplasm of kidney: Secondary | ICD-10-CM | POA: Insufficient documentation

## 2012-10-04 HISTORY — PX: ORIF ANKLE FRACTURE: SHX5408

## 2012-10-04 LAB — CBC WITH DIFFERENTIAL/PLATELET
Basophils Absolute: 0 10*3/uL (ref 0.0–0.1)
Basophils Relative: 0 % (ref 0–1)
Hemoglobin: 13.7 g/dL (ref 12.0–15.0)
Lymphocytes Relative: 22 % (ref 12–46)
MCHC: 35 g/dL (ref 30.0–36.0)
Monocytes Relative: 8 % (ref 3–12)
Neutro Abs: 6.2 10*3/uL (ref 1.7–7.7)
Neutrophils Relative %: 69 % (ref 43–77)
WBC: 9 10*3/uL (ref 4.0–10.5)

## 2012-10-04 LAB — BASIC METABOLIC PANEL
BUN: 22 mg/dL (ref 6–23)
Chloride: 104 mEq/L (ref 96–112)
GFR calc Af Amer: 87 mL/min — ABNORMAL LOW (ref >90)
Potassium: 3.5 mEq/L (ref 3.5–5.1)

## 2012-10-04 SURGERY — OPEN REDUCTION INTERNAL FIXATION (ORIF) ANKLE FRACTURE
Anesthesia: General | Site: Ankle | Laterality: Left | Wound class: Clean

## 2012-10-04 MED ORDER — MIDAZOLAM HCL 5 MG/5ML IJ SOLN
INTRAMUSCULAR | Status: DC | PRN
  Administered 2012-10-04 (×2): 1 mg via INTRAVENOUS

## 2012-10-04 MED ORDER — CEFAZOLIN SODIUM-DEXTROSE 2-3 GM-% IV SOLR
INTRAVENOUS | Status: DC | PRN
  Administered 2012-10-04: 2 g via INTRAVENOUS

## 2012-10-04 MED ORDER — PROPOFOL 10 MG/ML IV BOLUS
INTRAVENOUS | Status: DC | PRN
  Administered 2012-10-04: 120 mg via INTRAVENOUS

## 2012-10-04 MED ORDER — MIDAZOLAM HCL 2 MG/2ML IJ SOLN: 0.5000 mg | Freq: Once | INTRAMUSCULAR | Status: DC | PRN

## 2012-10-04 MED ORDER — FENTANYL CITRATE 0.05 MG/ML IJ SOLN
INTRAMUSCULAR | Status: DC | PRN
  Administered 2012-10-04 (×3): 50 ug via INTRAVENOUS
  Administered 2012-10-04: 100 ug via INTRAVENOUS

## 2012-10-04 MED ORDER — OXYCODONE HCL 5 MG PO TABS: 5.0000 mg | ORAL_TABLET | Freq: Once | ORAL | Status: DC | PRN

## 2012-10-04 MED ORDER — LIDOCAINE HCL (CARDIAC) 20 MG/ML IV SOLN
INTRAVENOUS | Status: DC | PRN
  Administered 2012-10-04: 40 mg via INTRAVENOUS

## 2012-10-04 MED ORDER — CEFAZOLIN SODIUM-DEXTROSE 2-3 GM-% IV SOLR
2.0000 g | Freq: Once | INTRAVENOUS | Status: AC
  Administered 2012-10-04: 2 g via INTRAVENOUS
  Filled 2012-10-04: qty 50

## 2012-10-04 MED ORDER — PROMETHAZINE HCL 25 MG/ML IJ SOLN
INTRAMUSCULAR | Status: AC
  Filled 2012-10-04: qty 1

## 2012-10-04 MED ORDER — OXYCODONE-ACETAMINOPHEN 5-325 MG PO TABS: 1.0000 | ORAL_TABLET | Freq: Four times a day (QID) | ORAL | Status: DC | PRN

## 2012-10-04 MED ORDER — ROCURONIUM BROMIDE 100 MG/10ML IV SOLN
INTRAVENOUS | Status: DC | PRN
  Administered 2012-10-04: 50 mg via INTRAVENOUS

## 2012-10-04 MED ORDER — 0.9 % SODIUM CHLORIDE (POUR BTL) OPTIME
TOPICAL | Status: DC | PRN
  Administered 2012-10-04: 1000 mL

## 2012-10-04 MED ORDER — ONDANSETRON HCL 4 MG/2ML IJ SOLN: 4.0000 mg | Freq: Once | INTRAMUSCULAR | Status: DC | PRN

## 2012-10-04 MED ORDER — ONDANSETRON HCL 4 MG/2ML IJ SOLN
INTRAMUSCULAR | Status: DC | PRN
  Administered 2012-10-04: 4 mg via INTRAVENOUS

## 2012-10-04 MED ORDER — BUPIVACAINE-EPINEPHRINE PF 0.5-1:200000 % IJ SOLN
INTRAMUSCULAR | Status: DC | PRN
  Administered 2012-10-04: 30 mL
  Administered 2012-10-04: 10 mL

## 2012-10-04 MED ORDER — OXYCODONE HCL 5 MG/5ML PO SOLN: 5.0000 mg | Freq: Once | ORAL | Status: DC | PRN

## 2012-10-04 MED ORDER — MEPERIDINE HCL 25 MG/ML IJ SOLN: 6.2500 mg | INTRAMUSCULAR | Status: DC | PRN

## 2012-10-04 MED ORDER — PROMETHAZINE HCL 50 MG PO TABS: 25.0000 mg | ORAL_TABLET | Freq: Four times a day (QID) | ORAL | Status: DC | PRN

## 2012-10-04 MED ORDER — ONDANSETRON HCL 4 MG/2ML IJ SOLN
4.0000 mg | Freq: Once | INTRAMUSCULAR | Status: AC | PRN
  Administered 2012-10-04: 4 mg via INTRAVENOUS
  Filled 2012-10-04: qty 2

## 2012-10-04 MED ORDER — ASPIRIN EC 325 MG PO TBEC: 325.0000 mg | DELAYED_RELEASE_TABLET | Freq: Every day | ORAL | Status: AC

## 2012-10-04 MED ORDER — METOPROLOL TARTRATE 1 MG/ML IV SOLN
INTRAVENOUS | Status: DC | PRN
  Administered 2012-10-04: 5 mg via INTRAVENOUS

## 2012-10-04 MED ORDER — GLYCOPYRROLATE 0.2 MG/ML IJ SOLN
INTRAMUSCULAR | Status: DC | PRN
  Administered 2012-10-04: 0.4 mg via INTRAVENOUS

## 2012-10-04 MED ORDER — PHENYLEPHRINE HCL 10 MG/ML IJ SOLN
INTRAMUSCULAR | Status: DC | PRN
  Administered 2012-10-04: 80 ug via INTRAVENOUS

## 2012-10-04 MED ORDER — ACETAMINOPHEN 10 MG/ML IV SOLN
INTRAVENOUS | Status: DC | PRN
  Administered 2012-10-04: 1000 mg via INTRAVENOUS

## 2012-10-04 MED ORDER — FENTANYL CITRATE 0.05 MG/ML IJ SOLN
50.0000 ug | Freq: Once | INTRAMUSCULAR | Status: AC | PRN
  Administered 2012-10-04: 50 ug via INTRAVENOUS
  Filled 2012-10-04 (×2): qty 2

## 2012-10-04 MED ORDER — PROMETHAZINE HCL 25 MG/ML IJ SOLN
6.2500 mg | INTRAMUSCULAR | Status: AC | PRN
  Administered 2012-10-04 (×2): 6.25 mg via INTRAVENOUS

## 2012-10-04 MED ORDER — NEOSTIGMINE METHYLSULFATE 1 MG/ML IJ SOLN
INTRAMUSCULAR | Status: DC | PRN
  Administered 2012-10-04: 4 mg via INTRAVENOUS

## 2012-10-04 MED ORDER — LACTATED RINGERS IV SOLN
INTRAVENOUS | Status: DC | PRN
  Administered 2012-10-04 (×2): via INTRAVENOUS

## 2012-10-04 MED ORDER — HYDROMORPHONE HCL PF 1 MG/ML IJ SOLN: 0.2500 mg | INTRAMUSCULAR | Status: DC | PRN

## 2012-10-04 SURGICAL SUPPLY — 64 items
BANDAGE ELASTIC 4 VELCRO ST LF (GAUZE/BANDAGES/DRESSINGS) ×1 IMPLANT
BANDAGE ELASTIC 6 VELCRO ST LF (GAUZE/BANDAGES/DRESSINGS) ×1 IMPLANT
BANDAGE ESMARK 6X9 LF (GAUZE/BANDAGES/DRESSINGS) ×1 IMPLANT
BIT DRILL 2.5X2.75 QC CALB (BIT) ×1 IMPLANT
BIT DRILL 2.9 CANN QC NONSTRL (BIT) ×1 IMPLANT
BLADE SURG 10 STRL SS (BLADE) ×2 IMPLANT
BLADE SURG ROTATE 9660 (MISCELLANEOUS) IMPLANT
BNDG CMPR 9X6 STRL LF SNTH (GAUZE/BANDAGES/DRESSINGS) ×1
BNDG ESMARK 6X9 LF (GAUZE/BANDAGES/DRESSINGS) ×2
CLOTH BEACON ORANGE TIMEOUT ST (SAFETY) ×2 IMPLANT
COVER MAYO STAND STRL (DRAPES) ×2 IMPLANT
COVER SURGICAL LIGHT HANDLE (MISCELLANEOUS) ×2 IMPLANT
CUFF TOURNIQUET SINGLE 34IN LL (TOURNIQUET CUFF) ×1 IMPLANT
CUFF TOURNIQUET SINGLE 44IN (TOURNIQUET CUFF) IMPLANT
DRAPE OEC MINIVIEW 54X84 (DRAPES) ×1 IMPLANT
DRAPE U-SHAPE 47X51 STRL (DRAPES) ×2 IMPLANT
DRSG ADAPTIC 3X8 NADH LF (GAUZE/BANDAGES/DRESSINGS) ×1 IMPLANT
DURAPREP 26ML APPLICATOR (WOUND CARE) ×2 IMPLANT
ELECT REM PT RETURN 9FT ADLT (ELECTROSURGICAL) ×2
ELECTRODE REM PT RTRN 9FT ADLT (ELECTROSURGICAL) ×1 IMPLANT
GAUZE XEROFORM 1X8 LF (GAUZE/BANDAGES/DRESSINGS) IMPLANT
GLOVE BIO SURGEON STRL SZ8 (GLOVE) ×1 IMPLANT
GLOVE BIOGEL PI IND STRL 8 (GLOVE) ×2 IMPLANT
GLOVE BIOGEL PI INDICATOR 8 (GLOVE) ×3
GLOVE ECLIPSE 7.5 STRL STRAW (GLOVE) ×4 IMPLANT
GOWN PREVENTION PLUS XLARGE (GOWN DISPOSABLE) ×2 IMPLANT
GOWN SRG XL XLNG 56XLVL 4 (GOWN DISPOSABLE) ×1 IMPLANT
GOWN STRL NON-REIN LRG LVL3 (GOWN DISPOSABLE) ×2 IMPLANT
GOWN STRL NON-REIN XL XLG LVL4 (GOWN DISPOSABLE) ×2
GOWN STRL REIN 3XL LVL4 (GOWN DISPOSABLE) ×1 IMPLANT
K-WIRE ACE 1.6X6 (WIRE) ×4
KIT BASIN OR (CUSTOM PROCEDURE TRAY) ×2 IMPLANT
KIT ROOM TURNOVER OR (KITS) ×2 IMPLANT
KWIRE ACE 1.6X6 (WIRE) IMPLANT
MANIFOLD NEPTUNE II (INSTRUMENTS) ×1 IMPLANT
NS IRRIG 1000ML POUR BTL (IV SOLUTION) ×2 IMPLANT
PACK ORTHO EXTREMITY (CUSTOM PROCEDURE TRAY) ×2 IMPLANT
PAD ARMBOARD 7.5X6 YLW CONV (MISCELLANEOUS) ×4 IMPLANT
PAD CAST 4YDX4 CTTN HI CHSV (CAST SUPPLIES) IMPLANT
PADDING CAST ABS 4INX4YD NS (CAST SUPPLIES) ×1
PADDING CAST ABS COTTON 4X4 ST (CAST SUPPLIES) IMPLANT
PADDING CAST COTTON 4X4 STRL (CAST SUPPLIES)
PLATE LOCK 3H 95 LT DIST FIB (Plate) ×1 IMPLANT
SCREW ACE CAN 4.0 44M (Screw) ×2 IMPLANT
SCREW LOCK 3.5X10 DIST TIB (Screw) ×1 IMPLANT
SCREW LOCK CORT STAR 3.5X10 (Screw) ×3 IMPLANT
SCREW LOCK CORT STAR 3.5X12 (Screw) ×4 IMPLANT
SCREW LOW PROFILE 12MMX3.5MM (Screw) ×2 IMPLANT
SCREW NON LOCKING LP 3.5 14MM (Screw) ×1 IMPLANT
SPLINT FIBERGLASS 4X30 (CAST SUPPLIES) ×1 IMPLANT
SPONGE GAUZE 4X4 12PLY (GAUZE/BANDAGES/DRESSINGS) ×1 IMPLANT
SPONGE LAP 4X18 X RAY DECT (DISPOSABLE) ×2 IMPLANT
STAPLER VISISTAT 35W (STAPLE) IMPLANT
SUCTION FRAZIER TIP 10 FR DISP (SUCTIONS) ×2 IMPLANT
SUT ETHILON 3 0 PS 1 (SUTURE) ×2 IMPLANT
SUT ETHILON 4 0 PS 2 18 (SUTURE) IMPLANT
SUT VIC AB 0 CTB1 27 (SUTURE) ×1 IMPLANT
SUT VIC AB 2-0 FS1 27 (SUTURE) ×1 IMPLANT
SUT VIC AB 3-0 FS2 27 (SUTURE) ×2 IMPLANT
SYR CONTROL 10ML LL (SYRINGE) IMPLANT
TOWEL OR 17X24 6PK STRL BLUE (TOWEL DISPOSABLE) ×2 IMPLANT
TOWEL OR 17X26 10 PK STRL BLUE (TOWEL DISPOSABLE) ×2 IMPLANT
TUBE CONNECTING 12X1/4 (SUCTIONS) ×2 IMPLANT
WATER STERILE IRR 1000ML POUR (IV SOLUTION) ×1 IMPLANT

## 2012-10-04 NOTE — Brief Op Note (Signed)
10/04/2012  7:29 PM  PATIENT:  Kara Buchanan  66 y.o. female  PRE-OPERATIVE DIAGNOSIS:  left ankle fracture  POST-OPERATIVE DIAGNOSIS:  same  PROCEDURE:  Procedure(s) (LRB) with comments: OPEN REDUCTION INTERNAL FIXATION (ORIF) ANKLE FRACTURE (Left)  SURGEON:  Surgeon(s) and Role:    * Harvie Junior, MD - Primary  PHYSICIAN ASSISTANT:   ASSISTANTS: bethune   ANESTHESIA:   general  EBL:  Total I/O In: 250 [I.V.:250] Out: -   BLOOD ADMINISTERED:none  DRAINS: none   LOCAL MEDICATIONS USED:  NONE  SPECIMEN:  No Specimen  DISPOSITION OF SPECIMEN:  N/A  COUNTS:  YES  TOURNIQUET:  * Missing tourniquet times found for documented tourniquets in log:  [id number] *  DICTATION: .Other Dictation: Dictation Number [id number]  PLAN OF CARE: Discharge to home after PACU  PATIENT DISPOSITION:  PACU - hemodynamically stable.   Delay start of Pharmacological VTE agent (>24hrs) due to surgical blood loss or risk of bleeding: no

## 2012-10-04 NOTE — ED Notes (Signed)
Per report from [facility] pt was outside walking out of her house and her yard is on a slope.  Pt tripped.  The result is L ankle pain with swelling to the ventral aspect.  + pedal pulse.  + sensation.

## 2012-10-04 NOTE — ED Notes (Signed)
Pt removed from LSB x 3 assist.  Pt denies pain along c-spine.  Collar left in place during procedure.

## 2012-10-04 NOTE — Transfer of Care (Signed)
Immediate Anesthesia Transfer of Care Note  Patient: Kara Buchanan  Procedure(s) Performed: Procedure(s) (LRB) with comments: OPEN REDUCTION INTERNAL FIXATION (ORIF) ANKLE FRACTURE (Left)  Patient Location: PACU  Anesthesia Type:General and GA combined with regional for post-op pain  Level of Consciousness: awake, alert  and oriented  Airway & Oxygen Therapy: Patient Spontanous Breathing and Patient connected to nasal cannula oxygen  Post-op Assessment: Report given to PACU RN and Post -op Vital signs reviewed and stable  Post vital signs: Reviewed and stable  Complications: No apparent anesthesia complications

## 2012-10-04 NOTE — Anesthesia Preprocedure Evaluation (Addendum)
Anesthesia Evaluation  Patient identified by MRN, date of birth, ID band Patient awake    Reviewed: Allergy & Precautions, H&P , NPO status , Patient's Chart, lab work & pertinent test results  History of Anesthesia Complications Negative for: history of anesthetic complications  Airway Mallampati: II TM Distance: >3 FB Neck ROM: Full    Dental No notable dental hx. (+) Teeth Intact and Dental Advisory Given,    Pulmonary neg pulmonary ROS,  breath sounds clear to auscultation  Pulmonary exam normal       Cardiovascular + dysrhythmias (treated with metoprolol) Supra Ventricular Tachycardia Rhythm:Regular Rate:Normal  '11 cath: normal coronaries, normal ventricular function   Neuro/Psych negative neurological ROS     GI/Hepatic negative GI ROS, Neg liver ROS,   Endo/Other  Morbid obesity  Renal/GU H/o partial nephrectomy for renal mass     Musculoskeletal   Abdominal (+) + obese,   Peds  Hematology negative hematology ROS (+)   Anesthesia Other Findings   Reproductive/Obstetrics                         Anesthesia Physical Anesthesia Plan  ASA: II and emergent  Anesthesia Plan: General   Post-op Pain Management:    Induction: Intravenous  Airway Management Planned: Oral ETT  Additional Equipment:   Intra-op Plan:   Post-operative Plan: Extubation in OR  Informed Consent: I have reviewed the patients History and Physical, chart, labs and discussed the procedure including the risks, benefits and alternatives for the proposed anesthesia with the patient or authorized representative who has indicated his/her understanding and acceptance.   Dental advisory given  Plan Discussed with: CRNA and Surgeon  Anesthesia Plan Comments: (Plan routine monitors, GETA with popliteal block for post op analgesia)        Anesthesia Quick Evaluation

## 2012-10-04 NOTE — ED Provider Notes (Signed)
  Physical Exam  BP 140/76  Pulse 71  Temp 98.3 F (36.8 C) (Oral)  Resp 13  SpO2 96%  Physical Exam  CV: RRR, No M/R/G, Peripheral pulses intact. No peripheral edema. Lungs: CTAB Abd: Soft, Non tender, non distended Musculoskeletal: Left ankle swelling and deformity.  No visible ecchymosis neurovascularly  ED Course  Procedures    Date: 10/04/2012  Rate: 66  Rhythm: normal sinus rhythm  QRS Axis: normal  Intervals: normal  ST/T Wave abnormalities: normal  Conduction Disutrbances: none  Narrative Interpretation:   Old EKG Reviewed: No significant changes noted  ; MDM Assumed care of the patient from PA GREENE. Patient has bimalleolar fracture with mild displacement of the tibia and fibula.  A consult with Dr. Luiz Blare who is on call for orthopedic surgery today.  He will admit the patient for surgical repair of the ankle this afternoon. Presurgical labs have been ordered chest x-ray shows chronic bronchitis and mild atelectasis with low lung volumes but no acute abnormalities.normal EKG   Patient is diabetic with some reactive airway. I made patient aware of the findings and plan of care she is agreeable to the plan.      Arthor Captain, PA-C 10/05/12 0005

## 2012-10-04 NOTE — ED Provider Notes (Signed)
History     CSN: [id number]  Arrival date & time 10/04/12  1401   First MD Initiated Contact with Patient 10/04/12 1459      Chief Complaint  Patient presents with  . Fall    (Consider location/radiation/quality/duration/timing/severity/associated sxs/prior treatment) HPI    Pt to ER by EMS. Pt walking outside down embankment, its raining, and slide down wet mudd twisting her left ankle under her. Her daughter was with her and helped her up. No injury to head or neck. No neck or head pain. I removed c-collar myself. No neck pain. Pt has PMH of nephrolithiasis. nad vss    Past Medical History  Diagnosis Date  . Nephrolithiasis   . Malignant neoplasm of kidney, except pelvis   . Multiple thyroid nodules     Past Surgical History  Procedure Date  . Laparoscopic cholecystectomy   . Vaginal hysterectomy v45.77   . Left laparoscopic partial nephrectomy   . Lithotripsy   . Tonsilletomy     Family History  Problem Relation Age of Onset  . Cervical cancer    . Hematuria    . Lung cancer    . Nephrolithiasis    . Cancer Mother     cervical  . Cancer Father     lung  . Heart disease Father     History  Substance Use Topics  . Smoking status: Never Smoker   . Smokeless tobacco: Not on file  . Alcohol Use: No    OB History    Grav Para Term Preterm Abortions TAB SAB Ect Mult Living                  Review of Systems  Review of Systems  Gen: no weight loss, fevers, chills, night sweats  Neck: no neck pain  Lungs:No wheezing, coughing or hemoptysis CV: no chest pain, palpitations, dependent edema or orthopnea  Abd: no abdominal pain, nausea, vomiting  GU: no dysuria or gross hematuria  MSK:  Left ankle injury Neuro: no headache, no focal neurologic deficits  Skin: no abnormalities Psyche: negative.   Allergies  Review of patient's allergies indicates no known allergies.  Home Medications   Current Outpatient Rx  Name  Route  Sig  Dispense   Refill  . FLUOXETINE HCL 20 MG PO CAPS   Oral   Take 20 mg by mouth daily.         Marland Kitchen METOPROLOL TARTRATE 25 MG PO TABS   Oral   Take 25 mg by mouth 2 (two) times daily.           Marland Kitchen SIMVASTATIN 40 MG PO TABS   Oral   Take 40 mg by mouth every evening.             BP 140/76  Pulse 72  Temp 98.3 F (36.8 C) (Oral)  Resp 18  SpO2 98%  Physical Exam  Nursing note and vitals reviewed. Constitutional: She appears well-developed and well-nourished. No distress.  HENT:  Head: Normocephalic and atraumatic.  Eyes: Pupils are equal, round, and reactive to light.  Neck: Normal range of motion. Neck supple.  Cardiovascular: Normal rate and regular rhythm.   Pulmonary/Chest: Effort normal.  Abdominal: Soft.  Musculoskeletal:       Left foot: She exhibits decreased range of motion, tenderness, bony tenderness, swelling, decreased capillary refill and deformity. She exhibits no crepitus and no laceration.  Neurological: She is alert.  Skin: Skin is warm and dry.    ED  Course  Procedures (including critical care time)   Labs Reviewed  CBC WITH DIFFERENTIAL  BASIC METABOLIC PANEL   No results found.   No diagnosis found.    MDM  Ankle appears to be deformed to visual exam.I ordered pre-op chest xray, left ankle xray.  Cbc, bmp, saline,  PRN 50 mcg fentanyl and 4mg  IV Zofran ordered.   Pt medically screened and handed off to the on-coming PA to await images and lab results.      Dorthula Matas, PA 10/04/12 [phone]

## 2012-10-04 NOTE — H&P (Addendum)
PREOPERATIVE H&P  Chief Complaint: ankle pain  HPI: Kara Buchanan is a 66 y.o. female who presents for evaluation of left ankle after a fall.  The patient has been seen in the emergency room and has been noted to have a bimalleolar ankle fracture.  We are consult today for treatment options. It has been present for several hours and has been worsening. The patient will need to undergo open reduction and internal fixation given the nature of the injury.  Pt is care giver for 2 others.  Past Medical History  Diagnosis Date  . Nephrolithiasis   . Malignant neoplasm of kidney, except pelvis   . Multiple thyroid nodules    Past Surgical History  Procedure Date  . Laparoscopic cholecystectomy   . Vaginal hysterectomy v45.77   . Left laparoscopic partial nephrectomy   . Lithotripsy   . Tonsilletomy    History   Social History  . Marital Status: Married    Spouse Name: N/A    Number of Children: N/A  . Years of Education: N/A   Social History Main Topics  . Smoking status: Never Smoker   . Smokeless tobacco: Not on file  . Alcohol Use: No  . Drug Use: No  . Sexually Active:    Other Topics Concern  . Not on file   Social History Narrative  . No narrative on file   Family History  Problem Relation Age of Onset  . Cervical cancer    . Hematuria    . Lung cancer    . Nephrolithiasis    . Cancer Mother     cervical  . Cancer Father     lung  . Heart disease Father    No Known Allergies Prior to Admission medications   Medication Sig Start Date End Date Taking? Authorizing Provider  FLUoxetine (PROZAC) 20 MG capsule Take 20 mg by mouth daily.   Yes Historical Provider, MD  metoprolol tartrate (LOPRESSOR) 25 MG tablet Take 25 mg by mouth 2 (two) times daily.     Yes Historical Provider, MD  simvastatin (ZOCOR) 40 MG tablet Take 40 mg by mouth every evening.     Yes Historical Provider, MD     Positive ROS: none  All other systems have been reviewed and were  otherwise negative with the exception of those mentioned in the HPI and as above.  Physical Exam: Filed Vitals:   10/04/12 1545  BP:   Pulse: 71  Temp:   Resp: 13    General: Alert, no acute distress Cardiovascular: No pedal edema Respiratory: No cyanosis, no use of accessory musculature GI: No organomegaly, abdomen is soft and non-tender Skin: No lesions in the area of chief complaint Neurologic: Sensation intact distally Psychiatric: Patient is competent for consent with normal mood and affect Lymphatic: No axillary or cervical lymphadenopathy  MUSCULOSKELETAL: left ankle tender to palpation over both median lateral side.  There is obvious and significant soft tissue swelling.  There is 2+ distal pulses.  There is pain with all range of motion.  X-ray: X-ray left ankle shows severely comminuted bimalleolar ankle fracture.  Assessment/Plan: 66 year old female who fell earlier today and complains of left ankle pain.  X-rays show bimalleolar ankle fracture.//The patient will need open reduction internal fixation of a severely comminuted bimalleolar ankle fracture.she will undergo preoperative evaluation in emergency room and is considered to be reasonable is a candidate for surgery I will take her to the operating room for open reduction and internal fixation.  Pt will be d/ced home if all goes well and she is able to get up and around after surgery    The risks benefits and alternatives were discussed with the patient including but not limited to the risks of nonoperative treatment, versus surgical intervention including infection, bleeding, nerve injury, malunion, nonunion, hardware prominence, hardware failure, need for hardware removal, blood clots, cardiopulmonary complications, morbidity, mortality, among others, and they were willing to proceed.  Predicted outcome is good, although there will be at least a six to nine month expected recovery.  Harvie Junior, MD 10/04/2012 5:06 PM

## 2012-10-04 NOTE — Anesthesia Postprocedure Evaluation (Signed)
  Anesthesia Post-op Note  Patient: Kara Buchanan  Procedure(s) Performed: Procedure(s) (LRB) with comments: OPEN REDUCTION INTERNAL FIXATION (ORIF) ANKLE FRACTURE (Left)  Patient Location: PACU  Anesthesia Type:GA combined with regional for post-op pain  Level of Consciousness: awake, alert , oriented and patient cooperative  Airway and Oxygen Therapy: Patient Spontanous Breathing  Post-op Pain: none  Post-op Assessment: Post-op Vital signs reviewed, Patient's Cardiovascular Status Stable, Respiratory Function Stable, Patent Airway and Pain level controlled, nausea improved  Post-op Vital Signs: Reviewed and stable  Complications: No apparent anesthesia complications

## 2012-10-04 NOTE — ED Provider Notes (Deleted)
MSE was initiated and I personally evaluated the patient and placed orders  at  2:34 PM on October 04, 2012.  Pt walking outside down embankment and slide down wet mudd twisting her left ankle under her.  No injury to head or neck. No neck or head pain. I removed c-collar myself. No neck pain  Patients ankle has strong pulses, warm, good sensation. Able to wiggle all five toes.  Ankle appears to be deformed to visual exam.I ordered pre-op chest xray, left ankle xray.  Cbc, bmp, saline,  PRN 50 mcg fentanyl and 4mg  IV Zofran ordered.  The patient appears stable so that the remainder of the MSE may be completed by another provider.   Dorthula Matas, PA 10/04/12 1438

## 2012-10-04 NOTE — Anesthesia Procedure Notes (Signed)
Anesthesia Regional Block:  Popliteal block  Pre-Anesthetic Checklist: ,, timeout performed, Correct Patient, Correct Site, Correct Laterality, Correct Procedure, Correct Position, site marked, Risks and benefits discussed,  Surgical consent,  Pre-op evaluation,  At surgeon's request and post-op pain management  Laterality: Left  Prep: chloraprep       Needles:  Injection technique: Single-shot  Needle Type: Stimulator Needle - 80     Needle Length: 8cm  Needle Gauge: 22 and 22 G    Additional Needles:  Procedures: nerve stimulator Popliteal block  Nerve Stimulator or Paresthesia:  Response: toe abduction, 0.45 mA, 0.1 ms,   Additional Responses:   Narrative:  Start time: 10/04/2012 5:53 PM End time: 10/04/2012 6:57 PM Injection made incrementally with aspirations every 5 mL.  Performed by: Personally  Anesthesiologist: c Jean Rosenthal, MD  Additional Notes: Pt identified in Holding room.  Monitors applied. Working IV access confirmed. Sterile prep L lateral knee.  #22ga PNS to toe dorsiflexion twitch at 0.58mA threshold.  30cc 0.5% Bupivacaine with 1:200k epi injected incrementally after negative test dose.  Re-prep L prox, antero-medial tibia.  10cc 0.5% Bupivacaine with 1:200k epi injected subq for supplementation of saphenous nerve. Patient asymptomatic, VSS, no heme aspirated, tolerated well.   Sandford Craze, MD   Popliteal block

## 2012-10-05 LAB — CREATININE
Creatinine: 0.64 mg/dL (ref 0.4–1.0)
GFR African American: >60 mL/min (ref >60)
GFR Non-African American: >60 mL/min (ref >60)

## 2012-10-05 LAB — BUN: BUN: 13 mg/dL (ref 5–20)

## 2012-10-05 NOTE — Op Note (Signed)
Kara Buchanan, Kara Buchanan NO.:  [id number]  MEDICAL RECORD NO.:  [id number]  LOCATION:  [facility]                         FACILITY:  [facility]  PHYSICIAN:  Harvie Junior, M.D.   DATE OF BIRTH:  1946-09-30  DATE OF PROCEDURE:  10/04/2012 DATE OF DISCHARGE:  10/04/2012                              OPERATIVE REPORT   PREOPERATIVE DIAGNOSIS:  Bimalleolar ankle fracture, left.  POSTOPERATIVE DIAGNOSIS:  Bimalleolar ankle fracture, left.  PROCEDURE:  Open reduction and internal fixation of left bimalleolar ankle fracture.  SURGEON:  Harvie Junior, M.D.  ASSISTANT:  Marshia Ly, P.A.  ANESTHESIA:  General.  BRIEF HISTORY:  Ms. Hsu is a 66 year old female with a history of slipping under stair.  She suffered a bimalleolar ankle fracture, comminuted severely.  After a long discussion about treatment options, we ultimately felt that open reduction and internal fixation was the most appropriate course of action and she is brought to the operating room for this procedure.  PROCEDURE:  The patient was brought to the operating room and after adequate anesthesia was achieved with general anesthetic, the patient placed supine on the operating table.  The left leg was then prepped and draped in usual sterile fashion.  Following this, the leg was exsanguinated.  Blood pressure tourniquet inflated to 350 mmHg. Following this, the incision was made in the lateral side, subcutaneous tissue down the level of the lateral malleolus.  There was a severely comminuted distal fibula fracture with a coronal split, a longitudinal split, and an avulsion of the most distal end.  Once this was identified, we felt that we are going to need the anatomic fibular plates and they were open.  I went to the medial side, put 2 cannulated 44 mm screws there, got an anatomic reduction.  Went back to the lateral side, got an anatomic reduction, and used an anatomic fibular plates, a little bit  osteoporotic, so we would like some long screws proximally to get good purchase and then distally put in 5 screws including a screw down into the avulsed piece, which was cradled by the tip of the anatomic fibular plate.  Once this was completed, the wound was copiously and thoroughly irrigated, suctioned dry, and anatomic reduction had been achieved.  The wound was then closed in layers with a combination of 2-0 and 3-0 Vicryl mediolaterally and a 3-0 nylon on both sides.  Sterile compressive dressing was applied as well as used posterior splint.  The patient was taken to the recovery room and was noted to be in a satisfactory condition.  Estimated blood loss for the procedure was none.     Harvie Junior, M.D.     Ranae Plumber  D:  10/04/2012  T:  10/05/2012  Job:  [id number]

## 2012-10-05 NOTE — ED Provider Notes (Signed)
Medical screening examination/treatment/procedure(s) were performed by non-physician practitioner and as supervising physician I was immediately available for consultation/collaboration.  Izabellah Dadisman, MD 10/05/12 0017 

## 2012-10-06 ENCOUNTER — Encounter (HOSPITAL_COMMUNITY): Payer: Self-pay | Admitting: Orthopedic Surgery

## 2012-10-06 NOTE — ED Provider Notes (Signed)
Medical screening examination/treatment/procedure(s) were performed by non-physician practitioner and as supervising physician I was immediately available for consultation/collaboration.  Zelta Enfield T Khamila Bassinger, MD 10/06/12 1639 

## 2012-11-10 ENCOUNTER — Other Ambulatory Visit (INDEPENDENT_AMBULATORY_CARE_PROVIDER_SITE_OTHER): Payer: Self-pay

## 2012-11-10 DIAGNOSIS — E042 Nontoxic multinodular goiter: Secondary | ICD-10-CM

## 2012-11-19 ENCOUNTER — Other Ambulatory Visit (INDEPENDENT_AMBULATORY_CARE_PROVIDER_SITE_OTHER): Payer: Self-pay

## 2012-11-19 ENCOUNTER — Telehealth (INDEPENDENT_AMBULATORY_CARE_PROVIDER_SITE_OTHER): Payer: Self-pay

## 2012-11-19 DIAGNOSIS — E041 Nontoxic single thyroid nodule: Secondary | ICD-10-CM

## 2012-11-19 NOTE — Telephone Encounter (Signed)
Pt notified due for u/s and tsh. Lab slip mailed to pt and pt will call [facility] to set up u/s.

## 2012-11-26 ENCOUNTER — Encounter

## 2012-11-27 IMAGING — US US THYROID BIOPSY
1 series · 6 of 6 positions shown · non-contrast
Comparison: Ultrasound performed 03/19/2011 which revealed a
dominant right 3 cm mass in the right lobe of the thyroid.

CLINICAL DATA: Incidental finding of thyroid nodule on CT scan.
History of renal cell carcinoma.  Request has been made for fine
needle aspirate of right thyroid nodule.

ULTRASOUND GUIDED NEEDLE ASPIRATE BIOPSY OF THE THYROID GLAND

[Series 1: us thyroid biopsy · 0.06mm/px · 6 acquisitions, 6 frames shown]
[im 1/6]
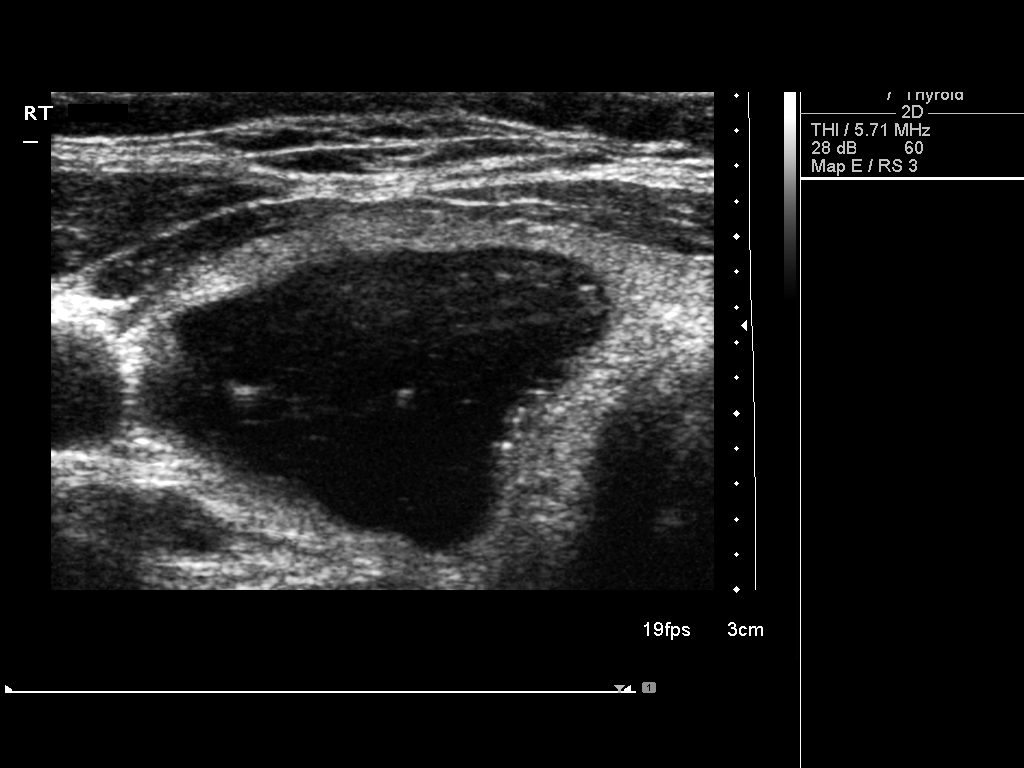
[im 2/6]
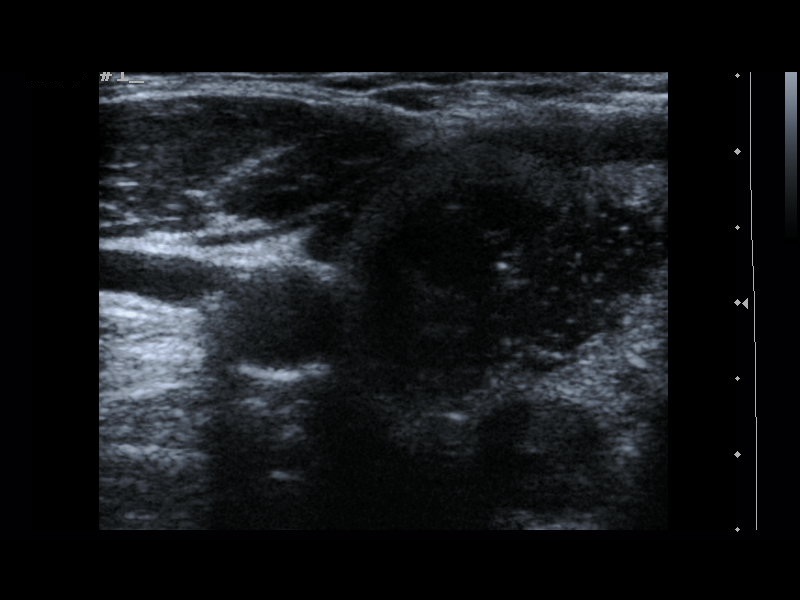
[im 3/6]
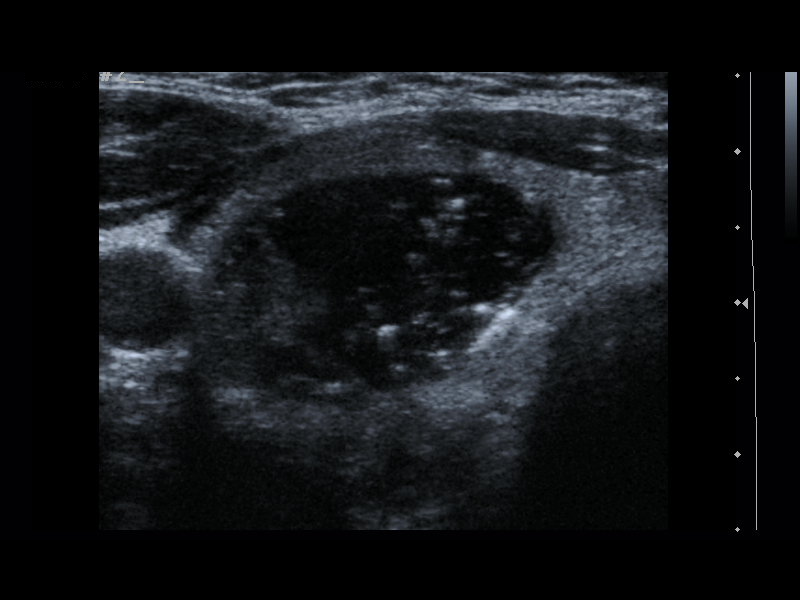
[im 4/6]
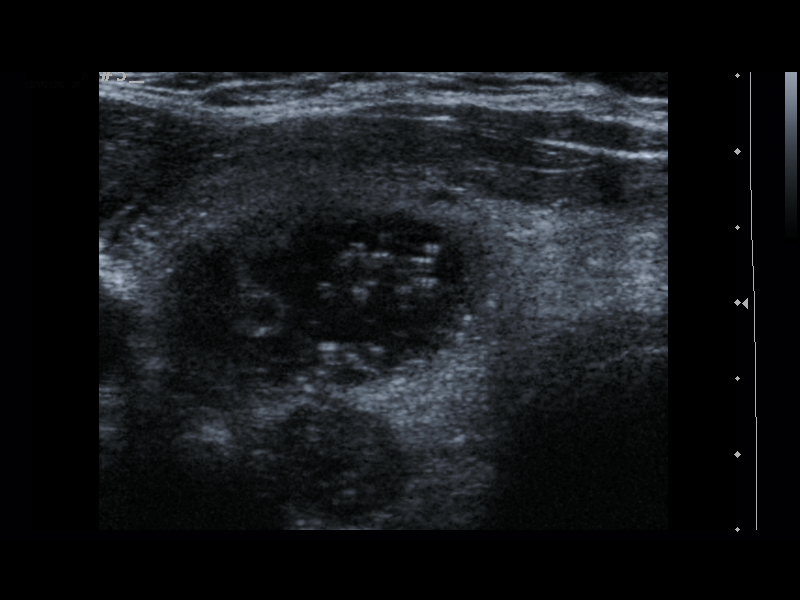
[im 5/6]
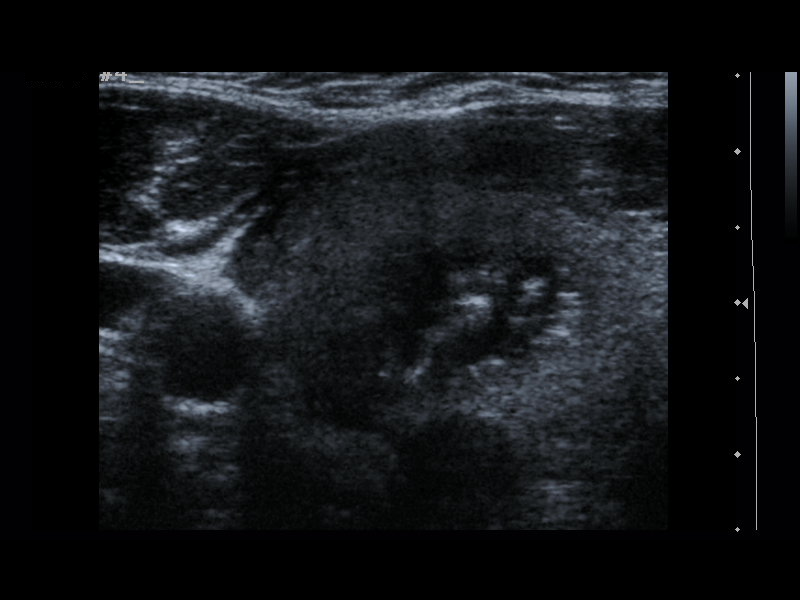
[im 6/6]
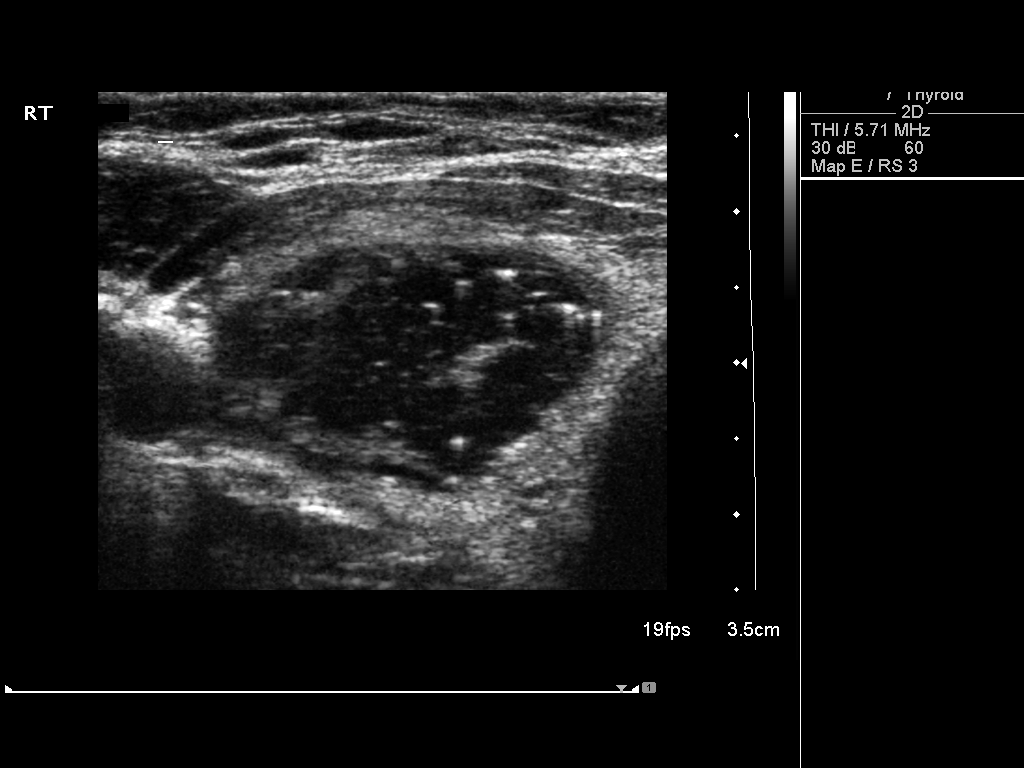

[6 of 6 positions shown; findings below may reference images not displayed]

Thyroid biopsy was thoroughly discussed with the patient and
questions were answered.  The benefits, risks, alternatives, and
complications were also discussed.  The patient understands and
wishes to proceed with the procedure.  Written consent was
obtained.

Ultrasound was performed to localize and mark an adequate site for
the biopsy.  The patient was then prepped and draped in a normal
sterile fashion.  Local anesthesia was provided with 1% lidocaine.
Using direct ultrasound guidance, four passes were made using 25
gauge needles into the nodule within the right lobe of the thyroid.
Ultrasound was used to confirm needle placements on all occasions.
Specimens were sent to Pathology for analysis.

Complications:  None immediate
FINDINGS: 3 cm right dominant thyroid nodule as seen on prior
ultrasound and CT.
IMPRESSION: Ultrasound guided needle aspirate biopsy performed of the right
thyroid nodule.

Read by: Bauman, Bekavac.-RAVI MALI

## 2012-12-03 ENCOUNTER — Other Ambulatory Visit (INDEPENDENT_AMBULATORY_CARE_PROVIDER_SITE_OTHER): Payer: Self-pay | Admitting: Surgery

## 2012-12-03 ENCOUNTER — Ambulatory Visit
Admission: RE | Admit: 2012-12-03 | Discharge: 2012-12-03 | Disposition: A | Discharge status: Home / Self Care | Payer: 59 | Source: Ambulatory Visit | Attending: Surgery | Admitting: Surgery

## 2012-12-03 DIAGNOSIS — E042 Nontoxic multinodular goiter: Secondary | ICD-10-CM

## 2012-12-03 LAB — TSH: TSH: 2.584 u[IU]/mL (ref 0.350–4.500)

## 2012-12-09 MED ORDER — DICYCLOMINE HCL 10 MG PO CAPS
10 MG | ORAL_CAPSULE | Freq: Four times a day (QID) | ORAL | Status: DC
Start: 2012-12-09 — End: 2014-01-10

## 2012-12-09 MED ORDER — MECLIZINE HCL 25 MG PO TABS
25 MG | ORAL_TABLET | Freq: Three times a day (TID) | ORAL | Status: AC | PRN
Start: 2012-12-09 — End: 2012-12-19

## 2012-12-09 MED ORDER — LORAZEPAM 1 MG PO TABS
1 | ORAL_TABLET | ORAL | Status: DC | PRN
Start: 2012-12-09 — End: 2015-10-02

## 2012-12-09 MED ORDER — LEVOFLOXACIN 500 MG PO TABS
500 MG | ORAL_TABLET | Freq: Every day | ORAL | Status: AC
Start: 2012-12-09 — End: 2012-12-19

## 2012-12-09 NOTE — Progress Notes (Signed)
Subjective:      Patient ID: Brandy [name] is a 67 y.o. female.    Dizziness  This is a new problem. The current episode started 1 to 4 weeks ago. The problem occurs daily (just when sitting up from a laying down position, and with head movements). The problem has been unchanged. Associated symptoms include coughing, headaches, nausea and vertigo. Pertinent negatives include no fever or vomiting. The symptoms are aggravated by bending. She has tried nothing for the symptoms. The treatment provided no relief.       Review of Systems   Constitutional: Negative for fever.   HENT: Positive for ear pain. Negative for sneezing.    Eyes: Negative.    Respiratory: Positive for cough. Negative for shortness of breath.    Cardiovascular: Negative.    Gastrointestinal: Positive for nausea. Negative for vomiting.   Endocrine: Negative.    Genitourinary: Negative.    Musculoskeletal: Negative.    Skin: Negative.    Allergic/Immunologic: Negative.    Neurological: Positive for dizziness, vertigo and headaches.       Objective:   Physical Exam   Vitals reviewed.  Constitutional: She is oriented to person, place, and time. She appears well-developed and well-nourished.   HENT:   Head: Normocephalic.   Nares/pharynx injected, neck supple, no lymphadenopathy   Eyes: Pupils are equal, round, and reactive to light.   Neck: Normal range of motion. Neck supple.   Cardiovascular: Normal rate, regular rhythm and normal heart sounds.    Pulmonary/Chest: Effort normal and breath sounds normal.   Abdominal: Soft.   Musculoskeletal: Normal range of motion.   Neurological: She is alert and oriented to person, place, and time.   Skin: Skin is warm and dry.   Psychiatric: She has a normal mood and affect.       Assessment:      1. Vertigo  meclizine (ANTIVERT) 25 MG tablet   2. Sinusitis acute  levofloxacin (LEVAQUIN) 500 MG tablet   3. Anxiety  LORazepam (ATIVAN) 1 MG tablet   4. IBS (irritable bowel syndrome)  dicyclomine (BENTYL) 10 MG  capsule      5. DM2  6. HTN      Plan:      Orders Placed This Encounter   Medications   ??? dicyclomine (BENTYL) 10 MG capsule     Sig: Take 1 capsule by mouth 4 times daily (before meals and nightly).     Dispense:  120 capsule     Refill:  3   ??? LORazepam (ATIVAN) 1 MG tablet     Sig: Take 1 tablet by mouth as needed for Anxiety.     Dispense:  30 tablet     Refill:  1   ??? levofloxacin (LEVAQUIN) 500 MG tablet     Sig: Take 1 tablet by mouth daily for 10 days.     Dispense:  10 tablet     Refill:  0   ??? meclizine (ANTIVERT) 25 MG tablet     Sig: Take 1 tablet by mouth 3 times daily as needed for 10 days.     Dispense:  60 tablet     Refill:  1     Encouraged to obtain labs  Call if no improvements

## 2012-12-09 NOTE — Patient Instructions (Signed)
MyChart allows you to send messages to your doctor, view your test results, renew your prescriptions, schedule appointments, and more.     How Do I Sign Up?  1. In your Internet browser, go to [email or web address]  2. Click on the Sign Up Now link in the Sign In box. You will see the New Member Sign Up page.  3. Enter your MyChart Access Code exactly as it appears below. You will not need to use this code after you???ve completed the sign-up process. If you do not sign up before the expiration date, you must request a new code.  MyChart Access Code: [email or web address]  Expires: 02/07/2013  4:41 PM    4. Enter your Social Security Number ([id number]) and Date of Birth (mm/dd/yyyy) as indicated and click Submit. You will be taken to the next sign-up page.  5. Create a MyChart ID. This will be your MyChart login ID and cannot be changed, so think of one that is secure and easy to remember.  6. Create a MyChart password. You can change your password at any time.  7. Enter your Password Reset Question and Answer. This can be used at a later time if you forget your password.   8. Enter your e-mail address. You will receive e-mail notification when new information is available in MyChart.  9. Click Sign Up. You can now view your medical record.     Additional Information  If you have questions, please contact the physician practice where you receive care. Remember, MyChart is NOT to be used for urgent needs. For medical emergencies, dial 911.

## 2012-12-10 ENCOUNTER — Ambulatory Visit (INDEPENDENT_AMBULATORY_CARE_PROVIDER_SITE_OTHER): Payer: 59 | Admitting: Surgery

## 2012-12-21 ENCOUNTER — Ambulatory Visit (INDEPENDENT_AMBULATORY_CARE_PROVIDER_SITE_OTHER): Payer: 59 | Admitting: Surgery

## 2012-12-21 ENCOUNTER — Encounter (INDEPENDENT_AMBULATORY_CARE_PROVIDER_SITE_OTHER): Payer: Self-pay | Admitting: Surgery

## 2012-12-21 VITALS — BP 138/82 | HR 68 | Temp 97.1°F | Resp 18 | Ht 67.0 in | Wt 206.0 lb

## 2012-12-21 DIAGNOSIS — E042 Nontoxic multinodular goiter: Secondary | ICD-10-CM

## 2012-12-21 NOTE — Progress Notes (Signed)
General Surgery [facility]  Visit Diagnoses: 1. Multinodular goiter (nontoxic)     HISTORY: Patient is a 67 year old white female followed for bilateral thyroid nodules. She returns at my request for followup. She is not on thyroid hormone. Her most recent TSH level is normal at 2.584. On 12/03/2012 she underwent thyroid ultrasound showing a mildly enlarged thyroid gland with bilateral multiple nodules. The dominant nodule in the right mid lobe measures 2.2 x 1.5 x 1.7 cm which is significantly decreased from her prior studies. The remainder of the nodules in the right lobe and the left lobe showed little change.  PERTINENT REVIEW OF SYSTEMS: Denies compressive symptoms. Denies tremor. Denies palpitations. Denies any masses. Denies pain.  EXAM: HEENT: normocephalic; pupils equal and reactive; sclerae clear; dentition good; mucous membranes moist NECK:  Palpable nodule right mid thyroid lobe, approximately 2 cm in size, mobile with swallowing, nontender; symmetric on extension; no palpable anterior or posterior cervical lymphadenopathy; no supraclavicular masses; no tenderness CHEST: clear to auscultation bilaterally without rales, rhonchi, or wheezes CARDIAC: regular rate and rhythm without significant murmur; peripheral pulses are full EXT:  non-tender without edema; no deformity; brace on left ankle NEURO: no gross focal deficits; no sign of tremor   IMPRESSION: Multinodular thyroid goiter, clinically stable  PLAN: Patient and I discussed the findings above. At this point in time there is no role for operative intervention. There are no suspicious nodules. We will continue to monitor her TSH level but she does not require thyroid hormone suppression at this point.  I have requested a follow-up thyroid ultrasound in one year. Her primary care physician we'll check her TSH level in September. I will see her back in one year for physical examination.  Velora Heckler,  MD, [facility] Office: [phone]

## 2012-12-21 NOTE — Patient Instructions (Signed)

## 2013-03-09 MED ORDER — PRAVASTATIN SODIUM 40 MG PO TABS
40 MG | ORAL_TABLET | Freq: Every evening | ORAL | Status: DC
Start: 2013-03-09 — End: 2014-10-12

## 2013-03-09 MED ORDER — LEVOTHYROXINE SODIUM 200 MCG PO TABS
200 MCG | ORAL_TABLET | Freq: Every day | ORAL | Status: DC
Start: 2013-03-09 — End: 2013-09-22

## 2013-03-09 MED ORDER — REPAGLINIDE 1 MG PO TABS
1 MG | ORAL_TABLET | Freq: Three times a day (TID) | ORAL | Status: DC
Start: 2013-03-09 — End: 2014-07-18

## 2013-03-09 MED ORDER — VENLAFAXINE HCL ER 150 MG PO CP24
150 MG | ORAL_CAPSULE | Freq: Every morning | ORAL | Status: DC
Start: 2013-03-09 — End: 2013-07-19

## 2013-03-09 MED ORDER — RANITIDINE HCL 150 MG PO TABS
150 MG | ORAL_TABLET | Freq: Two times a day (BID) | ORAL | Status: DC
Start: 2013-03-09 — End: 2013-10-05

## 2013-03-09 MED ORDER — CITALOPRAM HYDROBROMIDE 20 MG PO TABS
20 MG | ORAL_TABLET | Freq: Every morning | ORAL | Status: DC
Start: 2013-03-09 — End: 2014-05-11

## 2013-03-09 MED ORDER — METFORMIN HCL 500 MG PO TABS
500 MG | ORAL_TABLET | Freq: Every day | ORAL | Status: DC
Start: 2013-03-09 — End: 2013-10-05

## 2013-03-09 NOTE — Telephone Encounter (Signed)
Refills needed for effexor xr 150mg   #90 1 qam  Synthroid # 90 1 qam   celexa 40mg   #90  1 qpm  Zantac 150mg   #180  1 bid   Pravastatin 40mg    #90  1 qpm  Metformin 500mg    #90   1 QAM

## 2013-03-09 NOTE — Telephone Encounter (Signed)
Sent to pharm, including the prandin, which will be new, to take 3 times before meals daily

## 2013-03-15 MED ORDER — BENZONATATE 100 MG PO CAPS
100 MG | ORAL_CAPSULE | Freq: Three times a day (TID) | ORAL | Status: AC | PRN
Start: 2013-03-15 — End: 2013-03-22

## 2013-03-15 MED ORDER — AZITHROMYCIN 250 MG PO TABS
250 MG | PACK | ORAL | Status: AC
Start: 2013-03-15 — End: 2013-03-25

## 2013-03-15 NOTE — Telephone Encounter (Signed)
Per patient can't talk,coughinghead throat and chest congested ears plugged slight sore throat. Would like something called in so she can make it to work tomorrow.

## 2013-03-15 NOTE — Telephone Encounter (Signed)
Sent zpak, tessalon perles to [facility], notified pt as well

## 2013-03-23 ENCOUNTER — Other Ambulatory Visit (HOSPITAL_COMMUNITY): Payer: Self-pay | Admitting: Urology

## 2013-03-23 ENCOUNTER — Ambulatory Visit (HOSPITAL_COMMUNITY)
Admission: RE | Admit: 2013-03-23 | Discharge: 2013-03-23 | Disposition: A | Discharge status: Home / Self Care | Payer: Medicare Other | Source: Ambulatory Visit | Attending: Urology | Admitting: Urology

## 2013-03-23 ENCOUNTER — Encounter

## 2013-03-23 ENCOUNTER — Telehealth: Payer: BLUE CROSS/BLUE SHIELD

## 2013-03-23 DIAGNOSIS — C649 Malignant neoplasm of unspecified kidney, except renal pelvis: Secondary | ICD-10-CM | POA: Insufficient documentation

## 2013-03-23 DIAGNOSIS — I771 Stricture of artery: Secondary | ICD-10-CM | POA: Insufficient documentation

## 2013-03-23 DIAGNOSIS — R9389 Abnormal findings on diagnostic imaging of other specified body structures: Secondary | ICD-10-CM | POA: Insufficient documentation

## 2013-03-23 LAB — POCT URINALYSIS DIPSTICK
Bilirubin, UA: NEGATIVE
Blood, UA POC: NEGATIVE
Glucose, UA POC: NEGATIVE
Nitrite, UA: NEGATIVE
Spec Grav, UA: 1.03
Urobilinogen, UA: NEGATIVE
pH, UA: 6

## 2013-03-23 MED ORDER — CEPHALEXIN 250 MG PO CAPS
250 MG | ORAL_CAPSULE | Freq: Three times a day (TID) | ORAL | Status: DC
Start: 2013-03-23 — End: 2013-12-10

## 2013-03-23 NOTE — Telephone Encounter (Signed)
Will make sure patient is notified

## 2013-03-23 NOTE — Telephone Encounter (Signed)
Pt states she will schedule her own CT

## 2013-03-24 ENCOUNTER — Telehealth

## 2013-03-24 LAB — POCT URINALYSIS DIPSTICK
Bilirubin, UA: NEGATIVE
Blood, UA POC: NEGATIVE
Glucose, UA POC: NEGATIVE
Ketones, UA: NEGATIVE
Nitrite, UA: NEGATIVE
Protein, UA POC: NEGATIVE
Spec Grav, UA: 1.03
Urobilinogen, UA: NEGATIVE
pH, UA: 5

## 2013-03-24 NOTE — Telephone Encounter (Signed)
error 

## 2013-03-26 LAB — URINE CULTURE CLEAN CATCH: Culture: NO GROWTH

## 2013-04-05 NOTE — Telephone Encounter (Signed)
Urine culture ordered

## 2013-04-06 ENCOUNTER — Telehealth

## 2013-04-06 NOTE — Telephone Encounter (Signed)
CT abd essentially normal, incidental finding of infrarenal abd aneurysm should have vasc eval to monitor it

## 2013-04-13 ENCOUNTER — Encounter (INDEPENDENT_AMBULATORY_CARE_PROVIDER_SITE_OTHER): Payer: Self-pay

## 2013-04-21 MED ORDER — LEVOFLOXACIN 500 MG PO TABS
500 MG | ORAL_TABLET | Freq: Every day | ORAL | Status: AC
Start: 2013-04-21 — End: 2013-05-01

## 2013-04-21 MED ORDER — GUAIFENESIN-CODEINE 100-10 MG/5ML PO SYRP
100-10 MG/5ML | Freq: Two times a day (BID) | ORAL | Status: AC | PRN
Start: 2013-04-21 — End: 2013-04-28

## 2013-04-21 NOTE — Telephone Encounter (Signed)
Pt having a productive cough, coughing up yellow and greenish phlegm. Lots of congestion. Please advise

## 2013-04-21 NOTE — Telephone Encounter (Signed)
Sent Levaquin and codeine cough syrup to pharm

## 2013-04-21 NOTE — Telephone Encounter (Signed)
Called Script for Tussi-Organidin to [facility] and spoke to [place].

## 2013-04-21 NOTE — Telephone Encounter (Signed)
Called Pharmacy back and spoke to [facility].  They do not have Tussi- Organidin.  Per [name] wants a Generic with Codeine 10 Mg & Guafenisin  100 Mg.  Per Peggy The Pharmacist that would be Robitussin AC.  Gave Ok for Robitussin [facility] with same Sig & Quantity.

## 2013-04-22 ENCOUNTER — Telehealth

## 2013-04-22 NOTE — Telephone Encounter (Signed)
done

## 2013-04-22 NOTE — Telephone Encounter (Signed)
I need order for mammogram  To be done on May 17, 2013

## 2013-04-24 ENCOUNTER — Encounter

## 2013-04-28 MED ORDER — HYDROCOD POLST-CPM POLST ER 10-8 MG/5ML PO SUER
10-8 MG/5ML | Freq: Two times a day (BID) | ORAL | Status: DC | PRN
Start: 2013-04-28 — End: 2013-12-10

## 2013-04-28 MED ORDER — BENZONATATE 100 MG PO CAPS
100 MG | ORAL_CAPSULE | Freq: Three times a day (TID) | ORAL | Status: AC | PRN
Start: 2013-04-28 — End: 2013-05-08

## 2013-04-28 MED ORDER — LISINOPRIL 5 MG PO TABS
5 MG | ORAL_TABLET | Freq: Every day | ORAL | Status: DC
Start: 2013-04-28 — End: 2013-09-22

## 2013-04-28 MED ORDER — CEFUROXIME AXETIL 500 MG PO TABS
500 MG | ORAL_TABLET | Freq: Two times a day (BID) | ORAL | Status: AC
Start: 2013-04-28 — End: 2013-05-08

## 2013-04-28 NOTE — Patient Instructions (Signed)
Thank you for enrolling in MyChart. Please follow the instructions below to securely access your online medical record. MyChart allows you to send messages to your doctor, view your test results, renew your prescriptions, schedule appointments, and more.     How Do I Sign Up?  1. In your Internet browser, go to [email or web address]  2. Click on the Sign Up Now link in the Sign In box. You will see the New Member Sign Up page.  3. Enter your MyChart Access Code exactly as it appears below. You will not need to use this code after you???ve completed the sign-up process. If you do not sign up before the expiration date, you must request a new code.  MyChart Access Code: [email or web address]  Expires: 06/27/2013  1:40 PM    4. Enter your Social Security Number ([id number]) and Date of Birth (mm/dd/yyyy) as indicated and click Submit. You will be taken to the next sign-up page.  5. Create a MyChart ID. This will be your MyChart login ID and cannot be changed, so think of one that is secure and easy to remember.  6. Create a MyChart password. You can change your password at any time.  7. Enter your Password Reset Question and Answer. This can be used at a later time if you forget your password.   8. Enter your e-mail address. You will receive e-mail notification when new information is available in MyChart.  9. Click Sign Up. You can now view your medical record.     Additional Information  If you have questions, please contact your physician practice where you receive care. Remember, MyChart is NOT to be used for urgent needs. For medical emergencies, dial 911.

## 2013-04-28 NOTE — Telephone Encounter (Signed)
Called Script for Tussionex to [facility].

## 2013-04-28 NOTE — Progress Notes (Signed)
Subjective:      Patient ID: Brandy [name] is a 67 y.o. female.  Diabetes and Hypertension visit    BP Readings from Last 3 Encounters:   04/28/13 142/72   12/09/12 148/82        A1c (%)   Date Value   12/14/2010 7.0*        Hemoglobin A1C (%)   Date Value   07/15/2012 5.7    06/10/2012 Hemoglobin variant present, sample sent to reference laboratory.    06/10/2012 8.0*   12/14/2010 Hemoglobin variant present, sample sent to reference laboratory.         Microalb/Crt. Ratio (mcg/mg creat)   Date Value   07/15/2012 6         LDL Cholesterol (mg/dL)   Date Value   1/61/0960 139*        HDL (mg/dL)   Date Value   4/54/0981 40*        BUN (mg/dL)   Date Value   19/10/4780 13         CREATININE (mg/dL)   Date Value   95/03/2129 0.64         Creatinine (mg/dL)   Date Value   8/65/7846 0.70         Glucose (mg/dL)   Date Value   9/62/9528 177*   12/14/2010 165*          Are you taking any over-the-counter medicines, vitamins, or herbal medicines? yes - aleve,biotin     Are you taking any medications for Diabetes? -  Yes   If yes, see medication list   Are you having any side effects from Diabetes medications? - No    Is DEVERA ENGLANDER taking aspirin daily and it Aspirin on medication list?: -  Yes  If not and if the patient also has a history of vascular disease or is at increased risk of cardiovascular disease, the patient may be a candidate for daily aspirin. Discuss with physician.     Are you taking any medications for hypertension? -   Yes   If yes, see medication list  Are you having any side effects from hypertension medications? -  No    Are you taking cholesterol medication?  -  Yes   If yes, see medication list  Are you having any side effects from cholesterol medications? -  No    Tobacco use:  Patient  reports that she has never smoked. She has never used smokeless tobacco.  If Smoker - Cessation materials given? NA   1-800-QUIT-NOW [phone])     Have you seen any other physician or provider since your last  visit yes - Dr. Marjie Skiff    Have you had any other diagnostic tests since your last visit? yes - x rays of knees    Have you changed or stopped any medications since your last visit including any over-the-counter medicines, vitamins, or herbal medicines? yes      Are you taking all your prescribed medications? Yes  If NO, why? -  N/A    Diabetic Patient Self-Management Goal for this visit.    What is your goal for your visit today?   []  Increase activity-exercise for 30 min daily - 3 to 4 days per week   []  Improved diet - adhere to recommended diabetic diet/low cholesterol/low sodium diet    []  Take medications as prescribed and call the office if any troubles with medications develop   []  will check blood sugars  at least one time daily and blood pressure weekly   []  schedule an appointment with a diabetes educator   []  schedule yearly diabetic eye exam   []  will work on weight reduction program   [x]  persistant cough x 3 weeks, bp has been running high     Barriers to success: none  Plan for overcoming my barriers: N/A     Confidence: 10/10  Date goal set: 04/28/2013  Date expected to reach goal: 1day                       Microalbumin performed if applicable? - No   (due every 6 to 12 months)    BP taken with correct size cuff? - Yes   Repeated if > 130/80 No     Monofilament placed on counter? - No   (Diabetic foot exam - yearly)  Shoes and socks removed? No    Reminders - reviewed with patient today - Yes  ?? Tobacco -  If patient uses tobacco and intervention is done today - remember to mark counseling in the vital signs section and to give educational materials.   ?? Foot Exam - If monofilament exam has not bee done within the past year, prepare the patient and have the testing equipment ready.   ?? Eye Exam - Ask about diabetic retinal (eye) exam and record in health maintenance.  ?? BP - If BP >140/90, repeat reading. Check cuff size.    ?? Microalbumin urine test - If not performed in the last year, notify  physician. Enter order in pending status if your physician desires.     Diabetic Health Maintenance (Ensure Health Maintenance Modifier added)    No health maintenance topics applied.    Health Maintenance Due   Topic Date Due   ??? Dexa Scan Women  03/26/2013   ??? Breast Cancer Screening  03/21/2012       Medical history Review  Past Medical, Family, and Social History reviewed and does contribute to the patient presenting condition      HPI Notes         HPI Comments: Has been treated with levaquin, robitussin, tessalon perles, still with persistant cough  Chest was achey, cough worse when lying on her left  Concerned about infrarenal aneurysm 2.5cm on CT  Also concerned about benign lung nodules, per Dr Fenton Malling no change, can see prn    Cough  This is a new problem. The current episode started 1 to 4 weeks ago (3 weeks). The problem has been unchanged. The problem occurs every few hours (worse at night and in am, and if lying on left side, worse). The cough is productive of purulent sputum. Associated symptoms include headaches, nasal congestion, a sore throat and shortness of breath. Pertinent negatives include no postnasal drip. Nothing aggravates the symptoms. She has tried rest, OTC cough suppressant and prescription cough suppressant (levaquin, tessalon perles, codeine syrup, robitussin) for the symptoms. The treatment provided mild relief.       Review of Systems   Constitutional: Negative.    HENT: Positive for sore throat. Negative for postnasal drip.    Eyes: Negative.    Respiratory: Positive for cough and shortness of breath.    Cardiovascular: Negative.    Gastrointestinal: Negative.    Genitourinary: Negative.    Musculoskeletal: Positive for arthralgias.   Skin: Negative.    Neurological: Positive for headaches. Negative for light-headedness.   Psychiatric/Behavioral: Negative.  Objective:   Physical Exam   Nursing note and vitals reviewed.  Constitutional: She is oriented to person, place, and time.  She appears well-developed and well-nourished.   HENT:   Head: Normocephalic.   Right Ear: Hearing normal. Tympanic membrane is bulging.   Left Ear: Hearing normal. Tympanic membrane is bulging.   Nose: Nose normal.   Mouth/Throat: Posterior oropharyngeal erythema present. No oropharyngeal exudate, posterior oropharyngeal edema or tonsillar abscesses.   Eyes: Pupils are equal, round, and reactive to light.   Neck: Normal range of motion. Neck supple.   Cardiovascular: Normal rate, regular rhythm and normal heart sounds.    Pulmonary/Chest: Effort normal and breath sounds normal.   RLL rhonchi, dry non prod cough, resp easy, nonlabored   Abdominal: Soft. Bowel sounds are normal.   Musculoskeletal: Normal range of motion. She exhibits no edema.   Neurological: She is alert and oriented to person, place, and time.   Skin: Skin is warm and dry.   Psychiatric: She has a normal mood and affect.     BP 142/72   Pulse 80   Resp 14   Ht 5\' 6"  (1.676 m)   Wt 272 lb (123.378 kg)   BMI 43.92 kg/m2    PFSH reviewed not contributory for present problem  Assessment:      1. Acute bronchitis    2. HTN (hypertension)    3. Aneurysm of infrarenal abdominal aorta (HCC)    4. Diabetes type 2, controlled (HCC)             Plan:      Refill and titrate medications prn  Return to office as scheduled / prn       Orders Placed This Encounter   Procedures   ??? Albumin, Random Urine     Standing Status: Future      Number of Occurrences:       Standing Expiration Date: 04/28/2014   ??? CBC     Standing Status: Future      Number of Occurrences:       Standing Expiration Date: 04/28/2014   ??? Comprehensive metabolic panel     Standing Status: Future      Number of Occurrences:       Standing Expiration Date: 04/28/2014   ??? Lipid panel     Standing Status: Future      Number of Occurrences:       Standing Expiration Date: 04/28/2014     Order Specific Question:  Is Patient Fasting?/# of Hours     Answer:  8   ??? Hemoglobin A1c     Standing Status: Future       Number of Occurrences:       Standing Expiration Date: 04/28/2014   ??? TSH without Reflex     Standing Status: Future      Number of Occurrences:       Standing Expiration Date: 04/28/2014       Orders Placed This Encounter   Medications   ??? cefUROXime (CEFTIN) 500 MG tablet     Sig: Take 1 tablet by mouth 2 times daily for 10 days.     Dispense:  20 tablet     Refill:  0   ??? hydrocodone-chlorpheniramine (TUSSIONEX PENNKINETIC ER) 10-8 MG/5ML LQCR     Sig: Take 5 mLs by mouth every 12 hours as needed.     Dispense:  140 mL     Refill:  0   ??? benzonatate (TESSALON  PERLES) 100 MG capsule     Sig: Take 2 capsules by mouth 3 times daily as needed for Cough for 10 days.     Dispense:  60 capsule     Refill:  0   ??? lisinopril (PRINIVIL;ZESTRIL) 5 MG tablet     Sig: Take 1 tablet by mouth daily.     Dispense:  30 tablet     Refill:  3     Please call with any other questions or concerns  Monitor bp call if remains >140/90  Will check f/u CT ABD/Pelvis in 3 mos re: aneurysm  Yearly f/u CT chest   Start Lisinopril daily  Monitor bp call if remains >140/90  Call if no improvements with cough, shortness of breath, will check CXR next week  Will add Ceftin, increase tessalon perles to 200 mg TID, add Tussionex cough syrup  Obtain labs in 1 month (HbA1C, Lipid panel, CBC, CMP)(urine microalbumin)  F/u 3 mos or sooner if no improvements

## 2013-04-29 NOTE — Addendum Note (Signed)
Addended byRonne Binning on: 04/29/2013 09:52 AM     Modules accepted: Orders, SmartSet

## 2013-05-21 NOTE — Progress Notes (Signed)
Notified pt that mamm. Was normal

## 2013-05-25 ENCOUNTER — Encounter

## 2013-05-31 LAB — CBC
Hematocrit: 42.8 % (ref 36–46)
Hemoglobin: 14.1 g/dL (ref 12.0–16.0)
MCH: 28.8 pg (ref 26–34)
MCHC: 33 g/dL (ref 31–37)
MCV: 87.3 fL (ref 80–100)
MPV: 9 fL (ref 6.0–12.0)
Platelets: 320 10*3/uL (ref 140–450)
RBC: 4.91 m/uL (ref 4.0–5.2)
RDW: 14.9 % (ref 12.5–15.4)
WBC: 8 10*3/uL (ref 3.5–11.0)

## 2013-05-31 LAB — MICROALBUMIN, UR
Creatinine, Ur: 113 mg/dL (ref 39.0–259.0)
Microalb, Ur: <12 mg/L (ref <21)
Microalb/Crt. Ratio: 11 mcg/mg creat

## 2013-05-31 LAB — COMPREHENSIVE METABOLIC PANEL
ALT: 14 U/L (ref 5–33)
AST: 17 U/L (ref <32)
Albumin/Globulin Ratio: 1.1 — ABNORMAL LOW (ref 1.7–2.5)
Albumin: 3.9 g/dL (ref 3.5–5.2)
Alkaline Phosphatase: 96 U/L (ref 35–104)
Anion Gap: 17 mmol/L — ABNORMAL HIGH (ref 8–16)
BUN: 15 mg/dL (ref 8–23)
CO2: 29 mmol/L (ref 20–31)
Calcium: 9.4 mg/dL (ref 8.6–10.0)
Chloride: 97 mmol/L — ABNORMAL LOW (ref 98–107)
Creatinine: 0.51 mg/dL (ref 0.50–0.90)
GFR African American: >60 mL/min (ref >60)
GFR Non-African American: >60 mL/min (ref >60)
Glucose: 184 mg/dL — ABNORMAL HIGH (ref 70–99)
Potassium: 5 mmol/L (ref 3.5–5.1)
Sodium: 138 mmol/L (ref 133–142)
Total Bilirubin: 0.4 mg/dL (ref 0.3–1.2)
Total Protein: 7.5 g/dL (ref 6.4–8.3)

## 2013-05-31 LAB — LIPID PANEL
Chol/HDL Ratio: 5.6 — ABNORMAL HIGH (ref <5)
Cholesterol: 190 mg/dL (ref <200)
HDL: 34 mg/dL — ABNORMAL LOW (ref >40)
LDL Cholesterol: 120 mg/dL (ref 0–130)
Triglycerides: 181 mg/dL — ABNORMAL HIGH (ref <150)

## 2013-05-31 LAB — HEMOGLOBIN A1C
Estimated Avg Glucose: 134 mg/dL
Hemoglobin A1C: 6.3 % — ABNORMAL HIGH (ref 4.0–6.0)

## 2013-05-31 LAB — TSH: TSH: 2.44 mIU/L (ref 0.30–5.00)

## 2013-06-04 MED ADMIN — ioversol (OPTIRAY) 74 % injection 100 mL: INTRAVENOUS | @ 18:00:00 | NDC 00019133390

## 2013-06-17 MED ORDER — BUSPIRONE HCL 10 MG PO TABS
10 MG | ORAL_TABLET | Freq: Two times a day (BID) | ORAL | Status: DC
Start: 2013-06-17 — End: 2013-12-10

## 2013-06-17 NOTE — Progress Notes (Signed)
Subjective:      Have you seen any other physician or provider since your last visit no    Have you had any other diagnostic tests since your last visit? no    Have you changed or stopped any medications since your last visit including any over-the-counter medicines, vitamins, or herbal medicines? no     Are you taking all your prescribed medications? Yes  If NO, why? -  N/A           Patient Self-Management Goal for this visit.   What is your goal for your visit today? - headaches, pelvic pain, depression   Barriers to success: none   Plan for overcoming my barriers: N/A      Confidence: 10/10   Date goal set: 06/17/2013   Date expected to reach goal: 1day    Medical history Review  Past Medical, Family, and Social History reviewed and does not contribute to the patient presenting condition    Health Maintenance Due   Topic Date Due   ??? FLU VACCINE YEARLY (ADULT)  06/28/2013       Patient ID: Brandy [name] is a 67 y.o. female.    HPI Comments: Abd pain cramping last night -- kept her awake -- did resolve with bm -- currently resolved -- has bentyl to take with these episodes but did not take last night  Headache R frontal -- started at work yesterday -- possibly related to computer issue -- small print -- also may be related to lack of sleep/fatigue -- has not taken anything to make better -- did get new glasses one month ago -- no associated sxs -- no visual changes, no facial numbness/tingling, slurred speech, facial droop, no n/v -- no photo/phonophobia -- typical headache for patient  Depression -- seems to be worsening lately -- tearful today -- reports increased episodes of depressed mood, negative thinking, worrying, crying episodes -- reports stress at work and with family members, feelings of hopelessness, worry about medical conditions, afraid of dying -- has days when she has planned activities or tasks she wants to complete but just does not get the motivation to do so - -has been on same  antidepressant therapy for years -- does have ativan to use if needed for anxiety, uses infrequently -- does not use for sleep -- has had issues with getting to sleep, maintaining sleep -- thinks a lot about medical issues, family, husband's health -- thinking does tend to interfere with daily activities, exacerbates stress level -- denies SI/HI    Abdominal Pain  This is a recurrent problem. The current episode started yesterday. The onset quality is sudden. The problem occurs intermittently. The most recent episode lasted 6 hours. The problem has been resolved. The pain is located in the suprapubic region. The pain is at a severity of 5/10. The pain is moderate. The quality of the pain is cramping. The abdominal pain does not radiate. Associated symptoms include diarrhea (did have BM which calmed the episode and cramping down). Pertinent negatives include no anorexia, arthralgias, belching, constipation, dysuria, fever, flatus, frequency, headaches, hematochezia, hematuria, melena, myalgias, nausea, vomiting or weight loss. The pain is aggravated by eating. The pain is relieved by bowel movements (occasionally uses bentyl -- did not use last night). Her past medical history is significant for irritable bowel syndrome.       Review of Systems   Constitutional: Positive for diaphoresis (reports episodes of sweating - none currently noted). Negative for fever, chills, weight  loss, activity change, appetite change, fatigue and unexpected weight change.   HENT: Negative for congestion, sore throat, neck pain and neck stiffness.    Respiratory: Negative for cough, chest tightness, shortness of breath and wheezing.    Cardiovascular: Negative for chest pain, palpitations and leg swelling.   Gastrointestinal: Positive for abdominal pain and diarrhea (did have BM which calmed the episode and cramping down). Negative for nausea, vomiting, constipation, melena, hematochezia, abdominal distention, anal bleeding, anorexia and  flatus.   Endocrine: Negative for polydipsia, polyphagia and polyuria.   Genitourinary: Negative for dysuria, urgency, frequency, hematuria, difficulty urinating and pelvic pain.   Musculoskeletal: Positive for back pain (hx of but currently not an issue). Negative for myalgias and arthralgias.   Skin: Negative for pallor and rash.   Neurological: Negative for dizziness, facial asymmetry, weakness, light-headedness, numbness and headaches.   Psychiatric/Behavioral: Positive for sleep disturbance (having difficulty initiating sleep) and decreased concentration. Negative for suicidal ideas, confusion and dysphoric mood. The patient is not nervous/anxious and is not hyperactive.        Objective:   Physical Exam   Constitutional: She is oriented to person, place, and time. She appears well-developed and well-nourished. No distress.   HENT:   Head: Normocephalic.   Eyes: Pupils are equal, round, and reactive to light. No scleral icterus.   Neck: Normal range of motion. Neck supple. No thyromegaly present.   Cardiovascular: Normal rate, regular rhythm and normal heart sounds.    No murmur heard.  Pulmonary/Chest: Effort normal and breath sounds normal. No respiratory distress. She has no wheezes.   Abdominal: Soft. Bowel sounds are normal. She exhibits no distension, no fluid wave, no abdominal bruit and no mass. There is no tenderness.   Resolved suprapubic/lower abd pain/cramping -- associated with having a BM   Musculoskeletal: Normal range of motion. She exhibits no edema or tenderness.   Lymphadenopathy:     She has no cervical adenopathy.   Neurological: She is alert and oriented to person, place, and time. She has normal strength. No cranial nerve deficit or sensory deficit. GCS eye subscore is 4. GCS verbal subscore is 5. GCS motor subscore is 6.   No visual changes, no facial droop, no slurred speech , no neuro deficits    Skin: Skin is warm and dry. No rash noted.        Psychiatric: Her speech is normal and  behavior is normal. Judgment and thought content normal. Her mood appears not anxious. Her affect is not angry. Cognition and memory are normal. She exhibits a depressed mood. She expresses no homicidal and no suicidal ideation.   Sometimes tearful and flat affect -- makes eye contact -- feelings of helpless/hopelessness  Interferes with daily activities and sleep at times   Nursing note and vitals reviewed.      Brandy [name] is a 66 y.o. female who presents today for follow up on her medical conditions as noted below.  Brandy [name] is c/o of   Chief Complaint   Patient presents with   ??? Depression   ??? Pelvic Pain       Patient Active Problem List:     Hay fever     DM (diabetes mellitus) (HCC)     Depression     Urinary incontinence     Hyperlipidemia     HTN (hypertension)     Anxiety     Generalized headaches     OA (osteoarthritis)     Past  Medical History   Diagnosis Date   ??? Hay fever    ??? Osteoarthritis    ??? Anxiety    ??? Hyperlipidemia    ??? Headache(784.0)    ??? Hypertension    ??? Depression    ??? DM (diabetes mellitus) (HCC)    ??? Urinary incontinence    ??? Unspecified sleep apnea       Past Surgical History   Procedure Laterality Date   ??? Carpal tunnel release Right 1983   ??? Knee cartilage surgery Right 1973   ??? Cyst removal Right      x2   ??? Hysterectomy  1991   ??? Hernia repair Left 1990     femoral   ??? Neuroma surgery Left 1990     hernia site   ??? Breast lumpectomy Right    ??? Cardiac catheterization  2005,2010   ??? Knee arthroplasty Left    ??? Total knee arthroplasty Left 2013     Family History   Problem Relation Age of Onset   ??? Osteoporosis Mother    ??? Arthritis Mother    ??? Stroke Mother      Current Outpatient Prescriptions   Medication Sig Dispense Refill   ??? busPIRone (BUSPAR) 10 MG tablet Take 1 tablet by mouth 2 times daily.  180 tablet  1   ??? hydrocodone-chlorpheniramine (TUSSIONEX PENNKINETIC ER) 10-8 MG/5ML LQCR Take 5 mLs by mouth every 12 hours as needed.  140 mL  0   ??? lisinopril  (PRINIVIL;ZESTRIL) 5 MG tablet Take 1 tablet by mouth daily.  30 tablet  3   ??? cephALEXin (KEFLEX) 250 MG capsule Take 1 capsule by mouth 3 times daily.  21 capsule  0   ??? venlafaxine (EFFEXOR-XR) 150 MG XR capsule Take 1 capsule by mouth every morning.  90 capsule  3   ??? levothyroxine (SYNTHROID) 200 MCG tablet Take 1 tablet by mouth Daily.  90 tablet  3   ??? citalopram (CELEXA) 20 MG tablet Take 2 tablets by mouth every morning.  90 tablet  3   ??? ranitidine (ZANTAC) 150 MG tablet Take 1 tablet by mouth 2 times daily.  180 tablet  3   ??? pravastatin (PRAVACHOL) 40 MG tablet Take 1 tablet by mouth every evening.  90 tablet  3   ??? metFORMIN (GLUCOPHAGE) 500 MG tablet Take 1 tablet by mouth daily (with breakfast).  90 tablet  3   ??? repaglinide (PRANDIN) 1 MG tablet Take 1 tablet by mouth 3 times daily (before meals).  270 tablet  3   ??? oxyCODONE-acetaminophen (PERCOCET) 5-325 MG per tablet Take 1 tablet by mouth every 4 hours as needed for Pain.       ??? dicyclomine (BENTYL) 10 MG capsule Take 1 capsule by mouth 4 times daily (before meals and nightly).  120 capsule  3   ??? LORazepam (ATIVAN) 1 MG tablet Take 1 tablet by mouth as needed for Anxiety.  30 tablet  1   ??? dexlansoprazole (DEXILANT) 60 MG CPDR capsule Take 60 mg by mouth daily.       ??? aspirin 81 MG tablet Take 81 mg by mouth daily.         No current facility-administered medications for this visit.     Allergies   Allergen Reactions   ??? Ciprofloxacin Itching     IV cipro only   ??? Demerol Hcl [Meperidine]    ??? Tetanus Toxoids        History  Substance Use Topics   ??? Smoking status: Never Smoker    ??? Smokeless tobacco: Never Used   ??? Alcohol Use: Yes      Body mass index is 43.5 kg/(m^2).    Filed Vitals:    06/17/13 1158   BP: 126/82   Pulse: 80   Resp: 16   Height: 5\' 6"  (1.676 m)   Weight: 269 lb 6.4 oz (122.199 kg)               Assessment:      1. Depression -- uncontrolled busPIRone (BUSPAR) 10 MG tablet   2. Anxiety uncontrolled busPIRone (BUSPAR) 10 MG  tablet   3. Generalized headaches  Stable -- unchanged   4. IBS (irritable bowel syndrome)  stable            Plan:      Refill and titrate medications prn  Return in about 3 months (around 09/17/2013), or if symptoms worsen or fail to improve, for Pain, anxiety, depression.  No orders of the defined types were placed in this encounter.     Orders Placed This Encounter   Medications   ??? busPIRone (BUSPAR) 10 MG tablet     Sig: Take 1 tablet by mouth 2 times daily.     Dispense:  180 tablet     Refill:  1     Please call with any other questions or concerns  1.  Brandy [name] received counseling on healthy behaviors such as diet, exercise, smoking cessation (if applicable), taking medications.   2.  Patient given verbal or paper educational materials - see patient instructions  3.  Was a self-tracking handout given in paper form or via MyChart? YES  If yes, see orders or list here.  4.  Discussed use, benefit, and side effects of prescribed medications.  Barriers to medication compliance addressed.  All patient questions answered.  Pt voiced understanding.   5.  Reviewed prior labs and health maintenance  6.  Continue current medications, diet and exercise.    Completed Refills   Requested Prescriptions     Signed Prescriptions Disp Refills   ??? busPIRone (BUSPAR) 10 MG tablet 180 tablet 1     Sig: Take 1 tablet by mouth 2 times daily.

## 2013-06-17 NOTE — Patient Instructions (Signed)
Self-Care While You Recover From Depression: After Your Visit  Your Care Instructions  Taking good care of yourself is important as you recover from depression. In time, your symptoms will fade as your treatment takes hold. Do not give up. Instead, focus your energy on getting better.  Your mood will improve. It just takes some time. Focus on things that can help you feel better, such as being with friends and family, eating well, and getting enough rest. But take things slowly. Do not do too much too soon. You will begin to feel better gradually.  Follow-up care is a key part of your treatment and safety. Be sure to make and go to all appointments, and call your doctor if you are having problems. It's also a good idea to know your test results and keep a list of the medicines you take.  How can you care for yourself at home?  Be realistic  ?? If you have a large task to do, break it up into smaller steps you can handle, and just do what you can.  ?? You may want to put off important decisions until your depression has lifted. If you have plans that will have a major impact on your life, such as marriage, divorce, or a job change, try to wait a bit. Talk it over with friends and loved ones who can help you look at the overall picture first.  ?? Reaching out to people for help is important. Do not isolate yourself. Let your family and friends help you. Find someone you can trust and confide in, and talk to that person.  ?? Be patient, and be kind to yourself. Remember that depression is not your fault and is not something you can overcome with willpower alone. Treatment is necessary for depression, just like for any other illness. Feeling better takes time, and your mood will improve little by little.  Stay active  ?? Stay busy and get outside. Take a walk, or try some other light exercise.  ?? Talk with your doctor about an exercise program. Exercise can help with mild depression.  ?? Go to a movie or concert. Take part  in a church activity or other social gathering. Go to a ball game.  ?? Ask a friend to have dinner with you.  Take care of yourself  ?? Eat a balanced diet with plenty of fresh fruits and vegetables, whole grains, and lean protein. If you have lost your appetite, eat small snacks rather than large meals.  ?? Avoid drinking alcohol or using illegal drugs. Do not take medicines that have not been prescribed for you. They may interfere with medicines you may be taking for depression, or they may make your depression worse.  ?? Take your medicines exactly as they are prescribed. You may start to feel better within 1 to 3 weeks of taking antidepressant medicine. But it can take as many as 6 to 8 weeks to see more improvement. If you have questions or concerns about your medicines, or if you do not notice any improvement by 3 weeks, talk to your doctor.  ?? If you have any side effects from your medicine, tell your doctor. Antidepressants can make you feel tired, dizzy, or nervous. Some people have dry mouth, constipation, headaches, sexual problems, or diarrhea. Many of these side effects are mild and will go away on their own after you have been taking the medicine for a few weeks. Some may last longer. Talk to your doctor   if side effects are bothering you too much. You might be able to try a different medicine.  ?? Get enough sleep. If you have problems sleeping:  ?? Go to bed at the same time every night, and get up at the same time every morning.  ?? Keep your bedroom dark and quiet.  ?? Do not exercise after 5:00 p.m.  ?? Avoid drinks with caffeine after 5:00 p.m.  ?? Avoid sleeping pills unless they are prescribed by the doctor treating your depression. Sleeping pills may make you groggy during the day, and they may interact with other medicine you are taking.  ?? If you have any other illnesses, such as diabetes, heart disease, or high blood pressure, make sure to continue with your treatment. Tell your doctor about all of  the medicines you take, including those with or without a prescription.  When should you call for help?  Call 911 anytime you think you may need emergency care. For example, call if:  ?? You feel like hurting yourself or someone else.  ?? Someone you know has depression and is about to attempt or is attempting suicide.  Call your doctor now or seek immediate medical care if:  ?? You hear voices.  ?? Someone you know has depression and:  ?? Starts to give away his or her possessions.  ?? Uses illegal drugs or drinks alcohol heavily.  ?? Talks or writes about death, including writing suicide notes or talking about guns, knives, or pills.  ?? Starts to spend a lot of time alone.  ?? Acts very aggressively or suddenly appears calm.  Watch closely for changes in your health, and be sure to contact your doctor if:  ?? You do not get better as expected.   Where can you learn more?   Go to [email or web address] and sign in to your MyChart account. Enter [phone] in the Search Health Information box to learn more about ???Self-Care While You Recover From Depression: After Your Visit.???    If you do not have an account, please click on the ???Sign Up Now??? link.     ?? 2006-2014 [employer]. Care instructions adapted under license by [facility]. This care instruction is for use with your licensed healthcare professional. If you have questions about a medical condition or this instruction, always ask your healthcare professional. [employer] disclaims any warranty or liability for your use of this information.  Content Version: 10.0.273164; Last Revised: November 08, 2010              Diet for Irritable Bowel Syndrome: After Your Visit  Your Care Instructions  Irritable bowel syndrome, or IBS, is a problem with the intestines. IBS can cause belly pain, bloating, gas, constipation, and diarrhea. Most people can control their symptoms by changing their diet and easing stress.  No specific  foods cause everyone with IBS to have symptoms. Doctors don't offer a specific diet to manage symptoms. But many people find that they feel better when they stop eating certain foods. A high-fiber diet may help if you have constipation.  Follow-up care is a key part of your treatment and safety. Be sure to make and go to all appointments, and call your doctor if you are having problems. It's also a good idea to know your test results and keep a list of the medicines you take.  How can you care for yourself at home?  To reduce constipation  ?? Include fruits, vegetables, beans, and whole grains  in your diet each day. These foods are high in fiber. Slowly increase the amount of fiber you eat. This helps you avoid a lot of gas.  ?? Drink plenty of fluids, enough so that your urine is light yellow or clear like water. If you have kidney, heart, or liver disease and have to limit fluids, talk with your doctor before you increase the amount of fluids you drink.  ?? Get some exercise every day. Build up slowly to 30 to 60 minutes a day on 5 or more days of the week.  ?? Take a fiber supplement, such as Citrucel or Metamucil, every day if needed. Start with a small dose and very slowly increase the dose over a month or more.  ?? Schedule time each day for a bowel movement. Having a daily routine may help. Take your time and do not strain when having a bowel movement.  ?? Check with your doctor before you increase the amount of fiber in your diet. For some people who have IBS, eating more fiber may make some symptoms worse. This includes bloating.  To reduce diarrhea  You may try giving up foods or drinks one at a time to see whether symptoms improve. Limit or avoid the following:  ?? Alcohol  ?? Caffeine, which is found in coffee, tea, cola drinks, and chocolate  ?? Nicotine, from smoking or chewing tobacco  ?? Gas-producing foods, such as beans, broccoli, cabbage, and apples  ?? Dairy products that contain lactose (milk sugar), such  as ice cream, milk, cheese, and sour cream  ?? Foods and drinks high in sugar, especially fruit juice, soda, candy, and other packaged sweets (such as cookies)  ?? Foods high in fat, including bacon, sausage, butter, oils, and anything deep-fried  ?? Sorbitol and xylitol, artificial sweeteners found in some sugarless candies and chewing gum  Keep track of foods  ?? Some people with IBS use a daily food diary to keep track of what they eat and whether they have any symptoms after eating certain foods. The diary also can be a good way to record what is going on in your life.  ?? Stress plays a role in IBS. So if you are aware that certain stresses bring on symptoms, you can try to reduce those stresses.  Keep mealtimes pleasant  ?? Try to maintain a pleasant environment when you eat. This may reduce stress that can make symptoms likely to occur.  ?? Give yourself plenty of time to eat, rather than eating on the go. Chew your food slowly. Try not to swallow air, which can cause bloating.   Where can you learn more?   Go to [email or web address] and sign in to your MyChart account. Enter [phone] in the Search Health Information box to learn more about ???Diet for Irritable Bowel Syndrome: After Your Visit.???    If you do not have an account, please click on the ???Sign Up Now??? link.     ?? 2006-2014 [employer]. Care instructions adapted under license by [facility]. This care instruction is for use with your licensed healthcare professional. If you have questions about a medical condition or this instruction, always ask your healthcare professional. [employer] disclaims any warranty or liability for your use of this information.  Content Version: 10.0.273164; Last Revised: June 30, 2012

## 2013-07-19 MED ORDER — CEFDINIR 300 MG PO CAPS
300 MG | ORAL_CAPSULE | Freq: Two times a day (BID) | ORAL | Status: AC
Start: 2013-07-19 — End: 2013-07-29

## 2013-07-19 MED ORDER — CIPROFLOXACIN-DEXAMETHASONE 0.3-0.1 % OT SUSP
Freq: Two times a day (BID) | OTIC | Status: DC
Start: 2013-07-19 — End: 2013-12-10

## 2013-07-19 NOTE — Patient Instructions (Signed)
Vertigo: Exercises  Your Care Instructions  Here are some examples of typical rehabilitation exercises for your condition. Start each exercise slowly. Ease off the exercise if you start to have pain.  Your doctor or physical therapist will tell you when you can start these exercises and which ones will work best for you.  How to do the exercises  Note: Do these exercises twice a day. Try to progress to doing each head movement 15 to 20 times. Then try to do them with your eyes closed.  Exercise 1    1. Stand with a chair in front of you and a wall behind you. If you begin to fall, you may use them for support.  2. Stand with your feet together and your arms at your sides.  Exercise 2    Move your head side to side 10 times.  Exercise 3    Move your head diagonally up and down 10 times.  Exercise 4    Move your head diagonally up and down 10 times on the other side.  Follow-up care is a key part of your treatment and safety. Be sure to make and go to all appointments, and call your doctor if you are having problems. It's also a good idea to know your test results and keep a list of the medicines you take.   Where can you learn more?   Go to [email or web address] and sign in to your MyChart account. Enter F349 in the Search Health Information box to learn more about ???Vertigo: Exercises.???    If you do not have an account, please click on the ???Sign Up Now??? link.     ?? 2006-2014 [employer]. Care instructions adapted under license by [facility]. This care instruction is for use with your licensed healthcare professional. If you have questions about a medical condition or this instruction, always ask your healthcare professional. [employer] disclaims any warranty or liability for your use of this information.  Content Version: 10.1.311062; Current as of: December 17, 2011              Ear Infection (Otitis Media): After Your Visit  Your Care Instructions     An  ear infection may start with a cold and affect the middle ear (otitis media). It can hurt a lot. Most ear infections clear up on their own in a couple of days. Most often you will not need antibiotics. This is because many ear infections are caused by a virus. Antibiotics don't work against a virus. Regular doses of pain medicines are the best way to reduce your fever and help you feel better.  Follow-up care is a key part of your treatment and safety. Be sure to make and go to all appointments, and call your doctor if you are having problems. It's also a good idea to know your test results and keep a list of the medicines you take.  How can you care for yourself at home?  ?? Take pain medicines exactly as directed.  ?? If the doctor gave you a prescription medicine for pain, take it as prescribed.  ?? If you are not taking a prescription pain medicine, take an over-the-counter medicine, such as acetaminophen (Tylenol), ibuprofen (Advil, Motrin), or naproxen (Aleve). Read and follow all instructions on the label.  ?? Do not take two or more pain medicines at the same time unless the doctor told you to. Many pain medicines have acetaminophen, which is Tylenol. Too much acetaminophen (Tylenol)  can be harmful.  ?? Plan to take a full dose of pain reliever before bedtime. Getting enough sleep will help you get better.  ?? Try a warm, moist washcloth on the ear. It may help relieve pain.  ?? If your doctor prescribed antibiotics, take them as directed. Do not stop taking them just because you feel better. You need to take the full course of antibiotics.  When should you call for help?  Call your doctor now or seek immediate medical care if:  ?? You have new or increasing ear pain.  ?? You have new or increasing pus or blood draining from your ear.  ?? You have a fever with a stiff neck or a severe headache.  Watch closely for changes in your health, and be sure to contact your doctor if:  ?? You have new or worse symptoms.  ?? You  are not getting better after taking an antibiotic for 2 days.   Where can you learn more?   Go to [email or web address] and sign in to your MyChart account. Enter [phone] in the Search Health Information box to learn more about ???Ear Infection (Otitis Media): After Your Visit.???    If you do not have an account, please click on the ???Sign Up Now??? link.     ?? 2006-2014 [employer]. Care instructions adapted under license by [facility]. This care instruction is for use with your licensed healthcare professional. If you have questions about a medical condition or this instruction, always ask your healthcare professional. [employer] disclaims any warranty or liability for your use of this information.  Content Version: 10.1.311062; Current as of: August 04, 2012

## 2013-07-19 NOTE — Progress Notes (Signed)
[facility]  [address]  [address]  [place] [address]  Dept: [phone]  Dept Fax: [phone]    Brandy [name] is a 67 y.o. female who presents today for her medical conditions/complaints as noted below.  Brandy [name] is c/o of Nausea    Have you seen any other physician or provider since your last visit no    Have you had any other diagnostic tests since your last visit? no    Have you changed or stopped any medications since your last visit including any over-the-counter medicines, vitamins, or herbal medicines? no     Are you taking all your prescribed medications? Yes  If NO, why? -  N/A           Patient Self-Management Goal for this visit.   What is your goal for your visit today? - dx nausea, HA   Barriers to success: none   Plan for overcoming my barriers: N/A      Confidence: 10/10   Date goal set: 07/19/2013   Date expected to reach goal: 1day    Medical history Review  Past Medical, Family, and Social History reviewed and does contribute to the patient presenting condition    Health Maintenance Due   Topic Date Due   ??? FLU VACCINE YEARLY (ADULT)  06/28/2013         HPI:     HPI Comments: Room feels like it is spinning, worse with sudden head movements    Dizziness  This is a new problem. The current episode started in the past 7 days. The problem occurs daily. The problem has been gradually worsening. Associated symptoms include chills, fatigue and nausea. Pertinent negatives include no arthralgias, chest pain, coughing, fever, headaches, myalgias, rash, sore throat or vomiting. Associated symptoms comments: R ear pain, hx impaction. The symptoms are aggravated by bending. Treatments tried: tried meclizine. The treatment provided mild relief.       Past Medical History   Diagnosis Date   ??? Hay fever    ??? Osteoarthritis    ??? Anxiety    ??? Hyperlipidemia    ??? Headache(784.0)    ??? Hypertension    ??? Depression    ??? DM (diabetes mellitus)  (HCC)    ??? Urinary incontinence    ??? Unspecified sleep apnea       Past Surgical History   Procedure Laterality Date   ??? Carpal tunnel release Right 1983   ??? Knee cartilage surgery Right 1973   ??? Cyst removal Right      x2   ??? Hysterectomy  1991   ??? Hernia repair Left 1990     femoral   ??? Neuroma surgery Left 1990     hernia site   ??? Breast lumpectomy Right    ??? Cardiac catheterization  2005,2010   ??? Knee arthroplasty Left    ??? Total knee arthroplasty Left 2013       Family History   Problem Relation Age of Onset   ??? Osteoporosis Mother    ??? Arthritis Mother    ??? Stroke Mother        History   Substance Use Topics   ??? Smoking status: Never Smoker    ??? Smokeless tobacco: Never Used   ??? Alcohol Use: Yes      Current Outpatient Prescriptions   Medication Sig Dispense Refill   ??? meclizine (ANTIVERT) 25 MG tablet Take 25 mg  by mouth 3 times daily as needed.       ??? ciprofloxacin-dexamethasone (CIPRODEX) otic suspension Place 4 drops into the right ear 2 times daily.  1 Bottle  0   ??? cefdinir (OMNICEF) 300 MG capsule Take 1 capsule by mouth 2 times daily for 10 days.  20 capsule  0   ??? busPIRone (BUSPAR) 10 MG tablet Take 1 tablet by mouth 2 times daily.  180 tablet  1   ??? lisinopril (PRINIVIL;ZESTRIL) 5 MG tablet Take 1 tablet by mouth daily.  30 tablet  3   ??? levothyroxine (SYNTHROID) 200 MCG tablet Take 1 tablet by mouth Daily.  90 tablet  3   ??? citalopram (CELEXA) 20 MG tablet Take 2 tablets by mouth every morning.  90 tablet  3   ??? ranitidine (ZANTAC) 150 MG tablet Take 1 tablet by mouth 2 times daily.  180 tablet  3   ??? pravastatin (PRAVACHOL) 40 MG tablet Take 1 tablet by mouth every evening.  90 tablet  3   ??? metFORMIN (GLUCOPHAGE) 500 MG tablet Take 1 tablet by mouth daily (with breakfast).  90 tablet  3   ??? repaglinide (PRANDIN) 1 MG tablet Take 1 tablet by mouth 3 times daily (before meals).  270 tablet  3   ??? dicyclomine (BENTYL) 10 MG capsule Take 1 capsule by mouth 4 times daily (before meals and nightly).   120 capsule  3   ??? LORazepam (ATIVAN) 1 MG tablet Take 1 tablet by mouth as needed for Anxiety.  30 tablet  1   ??? aspirin 81 MG tablet Take 81 mg by mouth daily.       ??? hydrocodone-chlorpheniramine (TUSSIONEX PENNKINETIC ER) 10-8 MG/5ML LQCR Take 5 mLs by mouth every 12 hours as needed.  140 mL  0   ??? cephALEXin (KEFLEX) 250 MG capsule Take 1 capsule by mouth 3 times daily.  21 capsule  0   ??? oxyCODONE-acetaminophen (PERCOCET) 5-325 MG per tablet Take 1 tablet by mouth every 4 hours as needed for Pain.       ??? dexlansoprazole (DEXILANT) 60 MG CPDR capsule Take 60 mg by mouth daily.         No current facility-administered medications for this visit.     Allergies   Allergen Reactions   ??? Ciprofloxacin Itching     IV cipro only   ??? Demerol Hcl [Meperidine]    ??? Tetanus Toxoids        Health Maintenance   Topic Date Due   ??? FLU VACCINE YEARLY (ADULT)  06/28/2013   ??? FALLS RISK  04/28/2014   ??? BREAST CANCER SCREENING  05/17/2014   ??? DEPRESSION SCREENING  06/17/2014   ??? CERVICAL CANCER SCREENING  04/28/2016   ??? DEXA SCAN WOMEN  01/09/2018   ??? TETANUS VACCINE ADULT (11 YEARS AND UP)  04/29/2023   ??? COLON CANCER SCREENING COLONOSCOPY  04/29/2023   ??? ZOSTAVAX VACCINE  Addressed   ??? PNEUMOVAX VACCINE ADULT  Addressed       Subjective:      Review of Systems   Constitutional: Positive for chills and fatigue. Negative for fever.   HENT: Positive for ear pain. Negative for sore throat and rhinorrhea.    Eyes: Negative for photophobia and visual disturbance.   Respiratory: Negative for cough and shortness of breath.    Cardiovascular: Negative for chest pain, palpitations and leg swelling.   Gastrointestinal: Positive for nausea. Negative for vomiting, diarrhea and constipation.  Endocrine: Negative for polydipsia and polyuria.   Genitourinary: Negative for dysuria and urgency.   Musculoskeletal: Negative for myalgias and arthralgias.   Skin: Negative for rash and wound.   Allergic/Immunologic: Negative for environmental  allergies and immunocompromised state.   Neurological: Positive for dizziness. Negative for light-headedness and headaches.   Hematological: Negative for adenopathy. Does not bruise/bleed easily.   Psychiatric/Behavioral: Negative for sleep disturbance. The patient is not nervous/anxious.        Objective:     Physical Exam   Constitutional: She is oriented to person, place, and time. She appears well-developed and well-nourished.   HENT:   Head: Normocephalic and atraumatic.   Right Ear: Hearing normal. There is tenderness. Tympanic membrane is injected and erythematous.   Left Ear: Hearing normal. No tenderness. Tympanic membrane is bulging. Tympanic membrane is not injected and not erythematous.   Nose: Rhinorrhea present. No mucosal edema. Right sinus exhibits no maxillary sinus tenderness and no frontal sinus tenderness. Left sinus exhibits no maxillary sinus tenderness and no frontal sinus tenderness.   Mouth/Throat: Uvula is midline and mucous membranes are normal. Posterior oropharyngeal erythema present.   Eyes: Conjunctivae are normal. Pupils are equal, round, and reactive to light.   Neck: Normal range of motion. Neck supple.   Cardiovascular: Normal rate, regular rhythm and normal heart sounds.    No murmur heard.  Pulmonary/Chest: Effort normal and breath sounds normal.   Abdominal: Soft. Bowel sounds are normal.   Musculoskeletal: Normal range of motion. She exhibits no edema.   Neurological: She is alert and oriented to person, place, and time. No cranial nerve deficit.   Skin: Skin is warm and dry.   Psychiatric: She has a normal mood and affect. Her behavior is normal.   Nursing note and vitals reviewed.    BP 140/82   Pulse 80   Temp(Src) 98.2 ??F (36.8 ??C)   Resp 16   Ht 5\' 6"  (1.676 m)   Wt 270 lb (122.471 kg)   BMI 43.6 kg/m2    Assessment:      1. AOM (acute otitis media), right    2. Vertigo    3. HTN (hypertension) stable   4. Diabetes type 2, controlled (HCC) stable       Plan:      No  Follow-up on file.    No orders of the defined types were placed in this encounter.     Orders Placed This Encounter   Medications   ??? ciprofloxacin-dexamethasone (CIPRODEX) otic suspension     Sig: Place 4 drops into the right ear 2 times daily.     Dispense:  1 Bottle     Refill:  0   ??? cefdinir (OMNICEF) 300 MG capsule     Sig: Take 1 capsule by mouth 2 times daily for 10 days.     Dispense:  20 capsule     Refill:  0      Will start omnicef bid x 10 days  ciprodex bid x 7 days  Increase fluids  Meclizine prn  Tyl/ibuprofen  Offered antiemetic declines at this time  Call if no improvements or worsening symptoms  Change positions slowly     Patient given educational materials - see patient instructions.  Discussed use, benefit, and side effects of prescribed medications.  All patient questions answered.  Pt voiced understanding. Reviewed health maintenance.  Instructed to continue current medications, diet and exercise.  Patient agreed with treatment plan. Follow up as directed.  Electronically signed by Laurena Slimmer, NP on 07/19/2013 at 1:20 PM

## 2013-09-22 MED ORDER — LISINOPRIL 5 MG PO TABS
5 MG | ORAL_TABLET | Freq: Every day | ORAL | Status: DC
Start: 2013-09-22 — End: 2013-12-10

## 2013-09-22 MED ORDER — LEVOTHYROXINE SODIUM 200 MCG PO TABS
200 MCG | ORAL_TABLET | Freq: Every day | ORAL | Status: DC
Start: 2013-09-22 — End: 2014-09-19

## 2013-09-30 ENCOUNTER — Encounter

## 2013-10-05 ENCOUNTER — Encounter

## 2013-10-05 MED ORDER — RANITIDINE HCL 150 MG PO TABS
150 MG | ORAL_TABLET | Freq: Two times a day (BID) | ORAL | Status: DC
Start: 2013-10-05 — End: 2014-09-19

## 2013-10-05 MED ORDER — METFORMIN HCL 500 MG PO TABS
500 MG | ORAL_TABLET | Freq: Every day | ORAL | Status: DC
Start: 2013-10-05 — End: 2014-07-25

## 2013-10-07 ENCOUNTER — Telehealth

## 2013-10-07 MED ORDER — KETOROLAC TROMETHAMINE 60 MG/2ML IM SOLN
60 MG/2ML | Freq: Once | INTRAMUSCULAR | Status: DC
Start: 2013-10-07 — End: 2014-06-02

## 2013-10-07 NOTE — Telephone Encounter (Signed)
Pt was complaining of lower back pain and leg pain.  Dr. Carolee Rota was ok to give an injection of Toradol.

## 2013-10-09 LAB — CREATININE
Creatinine: 0.63 mg/dL (ref 0.50–0.90)
GFR African American: >60 mL/min (ref >60)
GFR Non-African American: >60 mL/min (ref >60)

## 2013-10-26 MED ORDER — MELOXICAM 15 MG PO TABS
15 MG | ORAL_TABLET | Freq: Every day | ORAL | Status: DC
Start: 2013-10-26 — End: 2014-01-31

## 2013-11-15 ENCOUNTER — Encounter (INDEPENDENT_AMBULATORY_CARE_PROVIDER_SITE_OTHER): Payer: Self-pay | Admitting: Surgery

## 2013-12-06 ENCOUNTER — Ambulatory Visit
Admission: RE | Admit: 2013-12-06 | Discharge: 2013-12-06 | Disposition: A | Discharge status: Home / Self Care | Payer: 59 | Source: Ambulatory Visit | Attending: Surgery | Admitting: Surgery

## 2013-12-06 DIAGNOSIS — E042 Nontoxic multinodular goiter: Secondary | ICD-10-CM

## 2013-12-10 MED ORDER — AMOXICILLIN 500 MG PO CAPS
500 MG | ORAL_CAPSULE | Freq: Once | ORAL | Status: AC
Start: 2013-12-10 — End: 2013-12-10

## 2013-12-10 NOTE — Progress Notes (Signed)
[facility]  [facility]  [address]  [address]  [place] [id number]  Dept: [phone]  Dept Fax: [phone]    Greig Castillaatricia A Swails is a 68 y.o. female who presents today for her medical conditions/complaints as noted below.  Greig CastillaPatricia A Plant is c/o of Hypertension; Diabetes; Hyperlipidemia; Cough; and Medication Reaction    Diabetes and Hypertension visit    BP Readings from Last 3 Encounters:   12/10/13 144/80   07/19/13 140/82   06/17/13 126/82        A1c (%)   Date Value   12/14/2010 7.0*        Hemoglobin A1C (%)   Date Value   05/31/2013 6.3*   07/15/2012 5.7    06/10/2012 Hemoglobin variant present, sample sent to reference laboratory.    06/10/2012 8.0*        Microalb/Crt. Ratio (mcg/mg creat)   Date Value   05/31/2013 11         LDL Cholesterol (mg/dL)   Date Value   6/5/78468/01/2013 120         HDL (mg/dL)   Date Value   9/6/29528/01/2013 34*        BUN (mg/dL)   Date Value   8/4/13248/01/2013 15         CREATININE (mg/dL)   Date Value   40/10/272512/13/2014 0.63         Creatinine (mg/dL)   Date Value   3/66/44035/25/2012 0.70         Glucose (mg/dL)   Date Value   4/7/42598/01/2013 184*   12/14/2010 165*          Are you taking any over-the-counter medicines, vitamins, or herbal medicines? yes - Vitamins     Are you taking any medications for Diabetes? -  Yes   If yes, see medication list   Are you having any side effects from Diabetes medications? - No    Is Greig Castillaatricia A Bayon taking aspirin daily and it Aspirin on medication list?: -  Yes  If not and if the patient also has a history of vascular disease or is at increased risk of cardiovascular disease, the patient may be a candidate for daily aspirin. Discuss with physician.     Are you taking any medications for hypertension? -   Yes   If yes, see medication list  Are you having any side effects from hypertension medications? -  Yes Lisinopril    Are you taking cholesterol medication?  -  Yes   If yes, see medication list  Are you having any side effects from  cholesterol medications? -  No    Tobacco use:  Patient  reports that she has never smoked. She has never used smokeless tobacco.  If Smoker - Cessation materials given? NA   1-800-QUIT-NOW [phone]([phone])     Have you seen any other physician or provider since your last visit yes - Dr. Caswell CorwinAnders and Rothhaas    Have you had any other diagnostic tests since your last visit? Yes MRI    Have you changed or stopped any medications since your last visit including any over-the-counter medicines, vitamins, or herbal medicines? no     Are you taking all your prescribed medications? No  If NO, why? -  Stopped lisinopril    Diabetic Patient Self-Management Goal for this visit.    What is your goal for your visit today?   []  Increase activity-exercise for 30 min daily - 3  to 4 days per week   []  Improved diet - adhere to recommended diabetic diet/low cholesterol/low sodium diet    []  Take medications as prescribed and call the office if any troubles with medications develop   []  will check blood sugars at least one time daily and blood pressure weekly   []  schedule an appointment with a diabetes educator   []  schedule yearly diabetic eye exam   []  will work on weight reduction program   []  HTN,DM and hyperlipidema     Barriers to success: none  Plan for overcoming my barriers: N/A     Confidence: 10/10  Date goal set: 12/10/2013  Date expected to reach goal: 1day                       Microalbumin performed if applicable? - No   (due every 6 to 12 months)    BP taken with correct size cuff? - Yes   Repeated if > 130/80 No     Monofilament placed on counter? - Yes   (Diabetic foot exam - yearly)  Shoes and socks removed? No    Reminders - reviewed with patient today - yes  ?? Tobacco -  If patient uses tobacco and intervention is done today - remember to mark counseling in the vital signs section and to give educational materials.   ?? Foot Exam - If monofilament exam has not bee done within the past year, prepare the patient and  have the testing equipment ready.   ?? Eye Exam - Ask about diabetic retinal (eye) exam and record in health maintenance.  ?? BP - If BP >140/90, repeat reading. Check cuff size.    ?? Microalbumin urine test - If not performed in the last year, notify physician. Enter order in pending status if your physician desires.     Diabetic Health Maintenance (Ensure Health Maintenance Modifier added)    Diabetes Management   Topic Date Due   ??? FOOT EXAM  03/26/1956   ??? OPHTHALMOLOGY EXAM  03/26/1956   ??? HEMOGLOBIN A1C  12/01/2013       Health Maintenance Due   Topic Date Due   ??? FOOT EXAM  03/26/1956   ??? OPHTHALMOLOGY EXAM  03/26/1956   ??? HEMOGLOBIN A1C  12/01/2013       Medical history Review  Past Medical, Family, and Social History reviewed and does not contribute to the patient presenting condition      HPI Notes        HPI:     HPI Comments: Has had cough worse at night, since she started lisinopril, stopped lisinopril 3 days ago, cough has improved, still with vertigo has appt with Dr Assunta Curtis, sinus drainage that is constant  Blood sugar this am 180, but had cake last night, normally in 120's in am  Blood pressure at home since off lisinopril has been 120/70  Less stress since she retired, stopped buspar as it was causing worsening of her dizziness, and she restarted effexor, doing well with the switch    Cough  This is a recurrent problem. The current episode started more than 1 month ago. The problem has been gradually improving. The problem occurs every few hours. The cough is non-productive. Associated symptoms include headaches, nasal congestion, postnasal drip, rhinorrhea and a sore throat. Pertinent negatives include no ear pain. Associated symptoms comments: Still with vertigo, has appt with Dr Assunta Curtis. Nothing aggravates the symptoms. Treatments tried: stopped lisinopril, cough  improved at night. The treatment provided moderate relief. Her past medical history is significant for bronchitis. There is no history of  COPD.       A1c (%)   Date Value   12/14/2010 7.0*        Hemoglobin A1C (%)   Date Value   05/31/2013 6.3*   07/15/2012 5.7    06/10/2012 Hemoglobin variant present, sample sent to reference laboratory.    06/10/2012 8.0*             ( goal A1C is < 7)   Microalb/Crt. Ratio (mcg/mg creat)   Date Value   05/31/2013 11      LDL Cholesterol (mg/dL)   Date Value   11/03/735 120    07/15/2012 139*   12/14/2010 129*       (goal LDL is <100)   AST (U/L)   Date Value   05/31/2013 17         ALT (U/L)   Date Value   05/31/2013 14         BUN (mg/dL)   Date Value   1/0/6269 15      BP Readings from Last 3 Encounters:   12/10/13 144/80   07/19/13 140/82   06/17/13 126/82          (goal 120/80)    Past Medical History   Diagnosis Date   ??? Hay fever    ??? Osteoarthritis    ??? Anxiety    ??? Hyperlipidemia    ??? Headache(784.0)    ??? Hypertension    ??? Depression    ??? DM (diabetes mellitus) (HCC)    ??? Urinary incontinence    ??? Unspecified sleep apnea       Past Surgical History   Procedure Laterality Date   ??? Carpal tunnel release Right 1983   ??? Knee cartilage surgery Right 1973   ??? Cyst removal Right      x2   ??? Hysterectomy  1991   ??? Hernia repair Left 1990     femoral   ??? Neuroma surgery Left 1990     hernia site   ??? Breast lumpectomy Right    ??? Cardiac catheterization  2005,2010   ??? Knee arthroplasty Left    ??? Total knee arthroplasty Left 2013       Family History   Problem Relation Age of Onset   ??? Osteoporosis Mother    ??? Arthritis Mother    ??? Stroke Mother        History   Substance Use Topics   ??? Smoking status: Never Smoker    ??? Smokeless tobacco: Never Used   ??? Alcohol Use: Yes      Current Outpatient Prescriptions   Medication Sig Dispense Refill   ??? venlafaxine (EFFEXOR XR) 150 MG XR capsule Take 150 mg by mouth daily.       ??? amoxicillin (AMOXIL) 500 MG capsule Take 1 capsule by mouth once for 1 dose. Take 4 pills by mouth prior to dental work  4 capsule  0   ??? meloxicam (MOBIC) 15 MG tablet Take 1 tablet by mouth daily.  30 tablet  3    ??? metFORMIN (GLUCOPHAGE) 500 MG tablet Take 1 tablet by mouth daily (with breakfast).  90 tablet  3   ??? ranitidine (ZANTAC) 150 MG tablet Take 1 tablet by mouth 2 times daily.  180 tablet  3   ??? levothyroxine (SYNTHROID) 200 MCG tablet Take 1 tablet by  mouth Daily.  90 tablet  3   ??? meclizine (ANTIVERT) 25 MG tablet Take 25 mg by mouth 3 times daily as needed.       ??? citalopram (CELEXA) 20 MG tablet Take 2 tablets by mouth every morning.  90 tablet  3   ??? pravastatin (PRAVACHOL) 40 MG tablet Take 1 tablet by mouth every evening.  90 tablet  3   ??? repaglinide (PRANDIN) 1 MG tablet Take 1 tablet by mouth 3 times daily (before meals).  270 tablet  3   ??? dicyclomine (BENTYL) 10 MG capsule Take 1 capsule by mouth 4 times daily (before meals and nightly).  120 capsule  3   ??? LORazepam (ATIVAN) 1 MG tablet Take 1 tablet by mouth as needed for Anxiety.  30 tablet  1   ??? aspirin 81 MG tablet Take 81 mg by mouth daily.       ??? ketorolac (TORADOL) 60 MG/2ML injection Inject 2 mLs into the muscle once for 1 dose.  2 mL  0   ??? dexlansoprazole (DEXILANT) 60 MG CPDR capsule Take 60 mg by mouth daily.         No current facility-administered medications for this visit.     Allergies   Allergen Reactions   ??? Ciprofloxacin Itching     IV cipro only   ??? Demerol Hcl [Meperidine]    ??? Tetanus Toxoids        Health Maintenance   Topic Date Due   ??? FOOT EXAM  03/26/1956   ??? OPHTHALMOLOGY EXAM  03/26/1956   ??? HEMOGLOBIN A1C  12/01/2013   ??? FALLS RISK  04/28/2014   ??? BREAST CANCER SCREENING  05/17/2014   ??? MICROALBUMINURIA  05/31/2014   ??? LIPIDS  05/31/2014   ??? DEPRESSION SCREENING  06/17/2014   ??? FLU VACCINE YEARLY (ADULT)  06/28/2014   ??? CERVICAL CANCER SCREENING  04/28/2016   ??? DEXA SCAN WOMEN  01/09/2018   ??? TETANUS VACCINE ADULT (11 YEARS AND UP)  04/29/2023   ??? COLON CANCER SCREENING COLONOSCOPY  04/29/2023   ??? ZOSTAVAX VACCINE  Addressed   ??? PNEUMOVAX VACCINE ADULT  Addressed       Subjective:      Review of Systems   HENT:  Positive for postnasal drip, rhinorrhea and sore throat. Negative for ear pain.    Respiratory: Positive for cough.    Neurological: Positive for headaches.       Objective:     Physical Exam   Constitutional: She is oriented to person, place, and time. She appears well-developed and well-nourished.   HENT:   Head: Normocephalic and atraumatic.   Right Ear: Hearing normal. Tympanic membrane is bulging. Tympanic membrane is not erythematous.   Left Ear: Hearing normal. Tympanic membrane is bulging. Tympanic membrane is not erythematous.   Nose: Rhinorrhea present. No mucosal edema. Right sinus exhibits no maxillary sinus tenderness and no frontal sinus tenderness. Left sinus exhibits no maxillary sinus tenderness and no frontal sinus tenderness.   Mouth/Throat: Uvula is midline and mucous membranes are normal. Posterior oropharyngeal erythema present. No posterior oropharyngeal edema.   Eyes: Conjunctivae are normal. Pupils are equal, round, and reactive to light.   Neck: Normal range of motion. Neck supple.   Cardiovascular: Normal rate, regular rhythm and normal heart sounds.    No murmur heard.  Pulmonary/Chest: Effort normal and breath sounds normal.   Abdominal: Soft. Bowel sounds are normal.   Musculoskeletal: Normal range of motion. She  exhibits no edema.   Lymphadenopathy:        Head (right side): No tonsillar, no preauricular and no posterior auricular adenopathy present.        Head (left side): No tonsillar, no preauricular and no posterior auricular adenopathy present.     She has no cervical adenopathy.   Neurological: She is alert and oriented to person, place, and time. No cranial nerve deficit.   Skin: Skin is warm and dry.   Psychiatric: She has a normal mood and affect. Her behavior is normal.   Nursing note and vitals reviewed.    BP 144/80    Pulse 76    Resp 16    Wt 277 lb 9.6 oz (125.919 kg)     Assessment:      1. Cough due to ACE inhibitor     2. Diabetes type 2, controlled (HCC)  Hemoglobin  A1C    Microalbumin, Ur   3. DM (diabetes mellitus) (HCC) stable    4. HTN (hypertension) stable    5. Allergic rhinitis         Plan:      Return in about 3 months (around 03/09/2014), or if symptoms worsen or fail to improve.    Orders Placed This Encounter   Procedures   ??? Hemoglobin A1C     Standing Status: Future      Number of Occurrences:       Standing Expiration Date: 12/10/2014   ??? Microalbumin, Ur     Standing Status: Future      Number of Occurrences:       Standing Expiration Date: 12/10/2014     Orders Placed This Encounter   Medications   ??? amoxicillin (AMOXIL) 500 MG capsule     Sig: Take 1 capsule by mouth once for 1 dose. Take 4 pills by mouth prior to dental work     Dispense:  4 capsule     Refill:  0      Amoxicillin for dental procedure  Start zyrtec/mucinex for cold like symptoms  No need for abx at this time  Check A1C/microalbumin  Enc healthy diabetic diet, cont to stay active  Cont monitor blood pressure/blood sugars  Call with any questions/concerns  Due for baseline labs in august  Mammogram this summer  F/u ENT for vertigo  F/u 3 mos     Patient given educational materials - see patient instructions.  Discussed use, benefit, and side effects of prescribed medications.  All patient questions answered.  Pt voiced understanding. Reviewed health maintenance.  Instructed to continue current medications, diet and exercise.  Patient agreed with treatment plan. Follow up as directed.     Electronically signed by Laurena Slimmer, NP on 12/10/2013 at 4:44 PM

## 2013-12-10 NOTE — Patient Instructions (Signed)
Allergies: After Your Visit  Your Care Instructions  Allergies occur when your body's defense system (immune system) overreacts to certain substances. The immune system treats a harmless substance as if it were a harmful germ or virus. Many things can cause this overreaction, including pollens, medicine, food, dust, animal dander, and mold.  Allergies can be mild or severe. Mild allergies can be managed with home treatment. But medicine may be needed to prevent problems.  Managing your allergies is an important part of staying healthy. Your doctor may suggest that you have allergy testing to help find out what is causing your allergies. When you know what things trigger your symptoms, you can avoid them. This can prevent allergy symptoms and other health problems.  For severe allergies that cause reactions that affect your whole body (anaphylactic reactions), your doctor may prescribe a shot of epinephrine to carry with you in case you have a severe reaction. Learn how to give yourself the shot and keep it with you at all times. Make sure it is not expired.  Follow-up care is a key part of your treatment and safety. Be sure to make and go to all appointments, and call your doctor if you are having problems. It's also a good idea to know your test results and keep a list of the medicines you take.  How can you care for yourself at home?  ?? If you have been told by your doctor that dust or dust mites are causing your allergy, decrease the dust around your bed:  ?? Wash sheets, pillowcases, and other bedding in hot water every week.  ?? Use dust-proof covers for pillows, duvets, and mattresses. Avoid plastic covers because they tear easily and do not "breathe." Wash as instructed on the label.  ?? Do not use any blankets and pillows that you do not need.  ?? Use blankets that you can wash in your washing machine.  ?? Consider removing drapes and carpets, which attract and hold dust, from your bedroom.  ?? If you are  allergic to house dust and mites, do not use home humidifiers. Your doctor can suggest ways you can control dust and mites.  ?? Look for signs of cockroaches. Cockroaches cause allergic reactions. Use cockroach baits to get rid of them. Then, clean your home well. Cockroaches like areas where grocery bags, newspapers, empty bottles, or cardboard boxes are stored. Do not keep these inside your home, and keep trash and food containers sealed. Seal off any spots where cockroaches might enter your home.  ?? If you are allergic to mold, get rid of furniture, rugs, and drapes that smell musty. Check for mold in the bathroom.  ?? If you are allergic to outdoor pollen or mold spores, use air-conditioning. Change or clean all filters every month. Keep windows closed.  ?? If you are allergic to pollen, stay inside when pollen counts are high. Use a vacuum cleaner with a HEPA filter or a double-thickness filter at least two times each week.  ?? Stay inside when air pollution is bad. Avoid paint fumes, perfumes, and other strong odors.  ?? Avoid conditions that make your allergies worse. Stay away from smoke. Do not smoke or let anyone else smoke in your house. Do not use fireplaces or wood-burning stoves.  ?? If you are allergic to your pets, change the air filter in your furnace every month. Use high-efficiency filters.  ?? If you are allergic to pet dander, keep pets outside or out of your   bedroom. Old carpet and cloth furniture can hold a lot of animal dander. You may need to replace them.  When should you call for help?  Call 911 anytime you think you may need emergency care. For example, call if:  ?? You have symptoms of a severe allergic reaction. These may include:  ?? Sudden raised, red areas (hives) all over your body.  ?? Swelling of the throat, mouth, lips, or tongue.  ?? Trouble breathing.  ?? Passing out (losing consciousness). Or you may feel very lightheaded or suddenly feel weak, confused, or restless.  Call your doctor now  or seek immediate medical care if:  ?? You have symptoms of an allergic reaction, such as:  ?? A rash or hives (raised, red areas on the skin).  ?? Itching.  ?? Swelling.  ?? Belly pain, nausea, or vomiting.  Watch closely for changes in your health, and be sure to contact your doctor if:  ?? You do not get better as expected.   Where can you learn more?   Go to [email or web address] and sign in to your MyChart account. Enter W171 in the Search Health Information box to learn more about ???Allergies: After Your Visit.???    If you do not have an account, please click on the ???Sign Up Now??? link.     ?? 2006-2014 [employer]. Care instructions adapted under license by [facility]. This care instruction is for use with your licensed healthcare professional. If you have questions about a medical condition or this instruction, always ask your healthcare professional. [employer], Inc[employer] disclaims any warranty or liability for your use of this information.  Content Version: 10.3.368381; Current as of: January 06, 2013

## 2013-12-13 NOTE — Telephone Encounter (Signed)
From: Greig CastillaPatricia A Espindola  To: Laurena Slimmerheryl L Jeffers, NP  Sent: 12/13/2013 12:27 PM EST  Subject: Non-Urgent Medical Question    Elnita Maxwellheryl, Cal's blood sugars have been going down to 50 in the morning since he has been on the prednisone for his surgery. He took less insulin this morning but was that right. I don't understand any of this and never have. [name] gave Cal Novolog in case it went over 300. Please advise PJ

## 2013-12-21 ENCOUNTER — Ambulatory Visit (INDEPENDENT_AMBULATORY_CARE_PROVIDER_SITE_OTHER): Payer: 59 | Admitting: Surgery

## 2013-12-22 ENCOUNTER — Telehealth (INDEPENDENT_AMBULATORY_CARE_PROVIDER_SITE_OTHER): Payer: Self-pay

## 2013-12-22 NOTE — Telephone Encounter (Signed)
Responding to pts phone msg re: thyroid labs. Per Dr Harlow Asa pt was to have TSH by pcp sept 2014. Pt states she thinks this was done. Pt will contact PCP to review thyroid labs. Pt had U/S done last week that was stable per impression by radiology. Pt states she will call her PCP today re: labs.

## 2014-01-10 ENCOUNTER — Encounter

## 2014-01-10 MED ORDER — DICYCLOMINE HCL 10 MG PO CAPS
10 MG | ORAL_CAPSULE | Freq: Four times a day (QID) | ORAL | Status: DC
Start: 2014-01-10 — End: 2014-06-02

## 2014-01-27 ENCOUNTER — Encounter (INDEPENDENT_AMBULATORY_CARE_PROVIDER_SITE_OTHER): Payer: Self-pay | Admitting: Surgery

## 2014-01-27 ENCOUNTER — Ambulatory Visit (INDEPENDENT_AMBULATORY_CARE_PROVIDER_SITE_OTHER): Payer: 59 | Admitting: Surgery

## 2014-01-27 VITALS — BP 124/84 | HR 80 | Temp 97.9°F | Resp 14 | Ht 67.0 in | Wt 215.8 lb

## 2014-01-27 DIAGNOSIS — E042 Nontoxic multinodular goiter: Secondary | ICD-10-CM

## 2014-01-27 NOTE — Patient Instructions (Signed)

## 2014-01-27 NOTE — Progress Notes (Signed)
General Surgery [facility]  Chief Complaint  Patient presents with  . Follow-up    bilateral thyroid nodules    HISTORY: Patient is a 68 year old female followed for bilateral thyroid nodules. Patient has never been on thyroid medication. At my request she had an ultrasound performed in February 2015. This shows a normal sized thyroid gland with the right lobe measuring 4.5 cm and left lobe measuring 4.5 cm as well. The dominant complex nodule in the right lobe now measures 1.6 cm and is smaller in size compared to her prior studies. There are scattered subcentimeter nodules in both thyroid lobes.  Patient is scheduled to have laboratory studies in September with her primary care physician. We will check her TSH level at that time.  PERTINENT REVIEW OF SYSTEMS: Denies tremor. Denies palpitations. Denies compressive symptoms.  EXAM: HEENT: normocephalic; pupils equal and reactive; sclerae clear; dentition good; mucous membranes moist NECK:  Nodular right thyroid lobe without discrete or dominant mass; left lobe without palpable abnormality; symmetric on extension; no palpable anterior or posterior cervical lymphadenopathy; no supraclavicular masses; no tenderness CHEST: clear to auscultation bilaterally without rales, rhonchi, or wheezes CARDIAC: regular rate and rhythm without significant murmur; peripheral pulses are full EXT:  non-tender without edema; no deformity NEURO: no gross focal deficits; no sign of tremor   IMPRESSION: Bilateral thyroid nodules, clinically stable; dominant nodule right thyroid lobe 1.6 cm, decreased in size  PLAN: Patient and I reviewed her ultrasound results in detail. I have asked her to have a TSH level performed in September when she sees her primary care physician. We will last Dr. Philip Aspen to forward a copy of that result to our office.  Patient will return in 2 years. We will repeat an ultrasound prior to that office  visit.  Earnstine Regal, MD, [facility] Office: [phone]  Visit Diagnoses: 1. Multinodular goiter (nontoxic)

## 2014-01-31 MED ORDER — MINOCYCLINE HCL 100 MG PO CAPS
100 MG | ORAL_CAPSULE | Freq: Two times a day (BID) | ORAL | Status: AC
Start: 2014-01-31 — End: 2014-02-10

## 2014-01-31 MED ORDER — MELOXICAM 15 MG PO TABS
15 MG | ORAL_TABLET | Freq: Every day | ORAL | Status: DC
Start: 2014-01-31 — End: 2014-10-12

## 2014-01-31 NOTE — Progress Notes (Signed)
[facility]  [address]  [address]  [place] [address]  Dept: [phone]  Dept Fax: [phone]    Brandy [name] is a 68 y.o. female who presents today for her medical conditions/complaints as noted below.  Brandy [name] is c/o of   Chief Complaint   Patient presents with   ??? Cyst     back of neck, very sore and painfull, bumps around ear, on Lt side more te Rt side x 1 month   ??? Medication Refill     Have you seen any other physician or provider since your last visit yes - Dr. Assunta Curtis and Rothhaas    Have you had any other diagnostic tests since your last visit? yes - ENG    Have you changed or stopped any medications since your last visit including any over-the-counter medicines, vitamins, or herbal medicines? no     Are you taking all your prescribed medications? Yes  If NO, why? -  N/A           Patient Self-Management Goal for this visit.   What is your goal for your visit today? - cyst on neck   Barriers to success: none   Plan for overcoming my barriers: N/A      Confidence: 10/10   Date goal set: 01/31/2014   Date expected to reach goal: 5days    Health Maintenance Due   Topic Date Due   ??? HEMOGLOBIN A1C  12/01/2013         HPI:     HPI Comments: Cyst behind head at base of scalp L sided -- comes and goes -- worse this time as associated with more cysts and fatigue  Pt has had this more frequently last month -- this one has present last month  Pt has used itch cream -- has not used no other medication  No fever no rash in other areas -- no new medication no new products     Rash  This is a recurrent problem. The current episode started more than 1 month ago. The problem has been waxing and waning since onset. The affected locations include the scalp. The rash is characterized by pain, swelling and itchiness. She was exposed to nothing. Associated symptoms include fatigue and a sore throat. Pertinent negatives include no anorexia, congestion,  cough, diarrhea, eye pain, facial edema, fever, joint pain, nail changes, rhinorrhea, shortness of breath or vomiting. Past treatments include anti-itch cream. The treatment provided no relief. Her past medical history is significant for varicella. There is no history of allergies, asthma or eczema.       A1c (%)   Date Value   12/14/2010 7.0*        Hemoglobin A1C (%)   Date Value   05/31/2013 6.3*   07/15/2012 5.7    06/10/2012 Hemoglobin variant present, sample sent to reference laboratory.    06/10/2012 8.0*             ( goal A1C is < 7)   Microalb/Crt. Ratio (mcg/mg creat)   Date Value   05/31/2013 11      LDL Cholesterol (mg/dL)   Date Value   04/30/2594 120    07/15/2012 139*   12/14/2010 129*       (goal LDL is <100)   AST (U/L)   Date Value   05/31/2013 17         ALT (U/L)   Date Value  05/31/2013 14         BUN (mg/dL)   Date Value   05/29/9561 15      BP Readings from Last 3 Encounters:   01/31/14 124/72   12/10/13 144/80   07/19/13 140/82          (goal 120/80)    Past Medical History   Diagnosis Date   ??? Hay fever    ??? Osteoarthritis    ??? Anxiety    ??? Hyperlipidemia    ??? Headache(784.0)    ??? Hypertension    ??? Depression    ??? DM (diabetes mellitus) (HCC)    ??? Urinary incontinence    ??? Unspecified sleep apnea       Past Surgical History   Procedure Laterality Date   ??? Carpal tunnel release Right 1983   ??? Knee cartilage surgery Right 1973   ??? Cyst removal Right      x2   ??? Hysterectomy  1991   ??? Hernia repair Left 1990     femoral   ??? Neuroma surgery Left 1990     hernia site   ??? Breast lumpectomy Right    ??? Cardiac catheterization  2005,2010   ??? Knee arthroplasty Left    ??? Total knee arthroplasty Left 2013       Family History   Problem Relation Age of Onset   ??? Osteoporosis Mother    ??? Arthritis Mother    ??? Stroke Mother        History   Substance Use Topics   ??? Smoking status: Never Smoker    ??? Smokeless tobacco: Never Used   ??? Alcohol Use: Yes      Current Outpatient Prescriptions   Medication Sig Dispense Refill   ???  meloxicam (MOBIC) 15 MG tablet Take 1 tablet by mouth daily.  90 tablet  3   ??? minocycline (MINOCIN;DYNACIN) 100 MG capsule Take 1 capsule by mouth 2 times daily for 10 days.  20 capsule  0   ??? dicyclomine (BENTYL) 10 MG capsule Take 1 capsule by mouth 4 times daily (before meals and nightly).  120 capsule  3   ??? venlafaxine (EFFEXOR XR) 150 MG XR capsule Take 150 mg by mouth daily.       ??? metFORMIN (GLUCOPHAGE) 500 MG tablet Take 1 tablet by mouth daily (with breakfast).  90 tablet  3   ??? ranitidine (ZANTAC) 150 MG tablet Take 1 tablet by mouth 2 times daily.  180 tablet  3   ??? levothyroxine (SYNTHROID) 200 MCG tablet Take 1 tablet by mouth Daily.  90 tablet  3   ??? meclizine (ANTIVERT) 25 MG tablet Take 25 mg by mouth 3 times daily as needed.       ??? citalopram (CELEXA) 20 MG tablet Take 2 tablets by mouth every morning.  90 tablet  3   ??? pravastatin (PRAVACHOL) 40 MG tablet Take 1 tablet by mouth every evening.  90 tablet  3   ??? repaglinide (PRANDIN) 1 MG tablet Take 1 tablet by mouth 3 times daily (before meals).  270 tablet  3   ??? LORazepam (ATIVAN) 1 MG tablet Take 1 tablet by mouth as needed for Anxiety.  30 tablet  1   ??? dexlansoprazole (DEXILANT) 60 MG CPDR capsule Take 60 mg by mouth daily.       ??? aspirin 81 MG tablet Take 81 mg by mouth daily.       ??? ketorolac (TORADOL)  60 MG/2ML injection Inject 2 mLs into the muscle once for 1 dose.  2 mL  0     No current facility-administered medications for this visit.     Allergies   Allergen Reactions   ??? Ciprofloxacin Itching     IV cipro only   ??? Demerol Hcl [Meperidine]    ??? Tetanus Toxoids        Health Maintenance   Topic Date Due   ??? HEMOGLOBIN A1C  12/01/2013   ??? FALLS RISK  04/28/2014   ??? BREAST CANCER SCREENING  05/17/2014   ??? MICROALBUMINURIA  05/31/2014   ??? LIPIDS  05/31/2014   ??? DEPRESSION SCREENING  06/17/2014   ??? FLU VACCINE YEARLY (ADULT)  06/28/2014   ??? OPHTHALMOLOGY EXAM  09/27/2014   ??? FOOT EXAM  02/01/2015   ??? CERVICAL CANCER SCREENING   04/28/2016   ??? DEXA SCAN WOMEN  01/09/2018   ??? TETANUS VACCINE ADULT (11 YEARS AND UP)  04/29/2023   ??? COLON CANCER SCREENING COLONOSCOPY  04/29/2023   ??? ZOSTAVAX VACCINE  Addressed   ??? PNEUMOVAX VACCINE ADULT  Addressed       Subjective:      Review of Systems   Constitutional: Positive for fatigue. Negative for fever and chills.   HENT: Positive for sore throat. Negative for congestion and rhinorrhea.    Eyes: Negative for pain.   Respiratory: Negative for cough, chest tightness and shortness of breath.    Cardiovascular: Negative for chest pain and leg swelling.   Gastrointestinal: Negative for nausea, vomiting, abdominal pain, diarrhea and anorexia.   Musculoskeletal: Negative for myalgias, joint pain, arthralgias and neck stiffness.   Skin: Positive for rash. Negative for nail changes.   Neurological: Negative for dizziness, weakness and light-headedness.   Psychiatric/Behavioral: Negative for confusion and sleep disturbance. The patient is not nervous/anxious.        Objective:     Physical Exam   Constitutional: She is oriented to person, place, and time. She appears well-developed and well-nourished. No distress.   HENT:   Head: Normocephalic.   Eyes: Pupils are equal, round, and reactive to light. No scleral icterus.   Neck: Normal range of motion. Neck supple. No thyromegaly present.   Cardiovascular: Normal rate, regular rhythm and normal heart sounds.    No murmur heard.  Pulmonary/Chest: Effort normal and breath sounds normal. No respiratory distress. She has no wheezes.   Abdominal: Soft. Bowel sounds are normal. She exhibits no distension. There is no tenderness.   Musculoskeletal: Normal range of motion. She exhibits no edema or tenderness.   Lymphadenopathy:     She has no cervical adenopathy.   Neurological: She is alert and oriented to person, place, and time. No cranial nerve deficit.   Skin: Skin is warm and dry. Rash noted. Rash is pustular.        Scattered healing abscesses scalp behind L  ear, at top of L ear at base of scalp   Psychiatric: She has a normal mood and affect. Her behavior is normal.   Nursing note and vitals reviewed.    BP 124/72    Pulse 78    Resp 16    Ht 5\' 6"  (1.676 m)    Wt 279 lb 6.4 oz (126.735 kg)    BMI 45.12 kg/m2       Assessment:      1. OA (osteoarthritis)  meloxicam (MOBIC) 15 MG tablet   2. Facial abscess  minocycline (MINOCIN;DYNACIN) 100  MG capsule             Plan:      Return in about 3 months (around 05/02/2014).    No orders of the defined types were placed in this encounter.     Orders Placed This Encounter   Medications   ??? meloxicam (MOBIC) 15 MG tablet     Sig: Take 1 tablet by mouth daily.     Dispense:  90 tablet     Refill:  3   ??? minocycline (MINOCIN;DYNACIN) 100 MG capsule     Sig: Take 1 capsule by mouth 2 times daily for 10 days.     Dispense:  20 capsule     Refill:  0       Patient given educational materials - see patient instructions.  Discussed use, benefit, and side effects of prescribed medications.  All patient questions answered. Pt voiced understanding. Reviewed health maintenance.  Instructed to continue current medications, diet and exercise.  Patient agreed with treatment plan. Follow up as directed.     Electronically signed by Christie Beckers, CNP on 01/31/2014 at 12:59 PM    Pt to call her dermatologist for f/u  May need surgery referral  Minocycline for current abscess  I&D not indicated at this time  Call any worsening or no improvement  Return as needed

## 2014-02-23 LAB — HEMOGLOBIN A1C: Hemoglobin A1C: 4.2 %

## 2014-02-23 LAB — MICROALBUMIN, UR
Creatinine, Ur: 102.77 mg/dL
Microalb, Ur: <0.2

## 2014-02-25 ENCOUNTER — Encounter

## 2014-03-02 MED ORDER — MELOXICAM 15 MG PO TABS
15 MG | ORAL_TABLET | ORAL | Status: DC
Start: 2014-03-02 — End: 2014-06-02

## 2014-03-11 NOTE — Telephone Encounter (Signed)
From: Greig CastillaPatricia A Mashaw  To: Laurena Slimmerheryl L Jeffers, NP  Sent: 03/11/2014 11:26 AM EDT  Subject: Non-Urgent Medical Question    Hi Elnita Maxwellheryl,    I have been seeing Dr Assunta Curtisoma for the vertigo and have had a hearing test and an ENG, both of which were abnormal. 2 weeks ago I had an ABR which was also abnormal so I have to have an MRI. 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 I don't know where we go from there. I don't think he sent you the reports but if they call the [place] office they will send them to you.     Marland Kitchen.   PJ

## 2014-03-11 NOTE — Progress Notes (Signed)
Please obtain records from Dr Assunta Curtisoma

## 2014-03-22 NOTE — Telephone Encounter (Signed)
Spoke to office to get copies of test results for her appointment with [name] on 03/23/14. Their office has all results except MRI. I will track this down and they are faxing the rest over.

## 2014-03-22 NOTE — Telephone Encounter (Signed)
From: [name]  To: Leanora Ivanoff, DO  Sent: 03/22/2014 10:45 AM EDT  Subject: Non-Urgent Medical Question    [name], I am coming in to see you tomorrow for a sore throat and a crazy rash I hope Dr Annetta Maw office faxed over my results from my testing. I had a hearing test , ENG and a ABR which were all abnormal. I didn't see them in my chart. See you at 2:15pm Wed.     PJ

## 2014-03-23 MED ORDER — VENLAFAXINE HCL ER 150 MG PO CP24
150 MG | ORAL_CAPSULE | Freq: Every day | ORAL | Status: DC
Start: 2014-03-23 — End: 2014-06-02

## 2014-03-23 MED ORDER — CEFUROXIME AXETIL 500 MG PO TABS
500 MG | ORAL_TABLET | Freq: Two times a day (BID) | ORAL | Status: DC
Start: 2014-03-23 — End: 2014-06-02

## 2014-03-23 NOTE — Telephone Encounter (Signed)
Results are on your desk.

## 2014-03-23 NOTE — Progress Notes (Signed)
[facility]  [address]  [address]  [place] [address]  Dept: [phone]  Dept Fax: [phone]    Brandy [name] is a 68 y.o. female who presents today for her medical conditions/complaints as noted below.  Brandy [name] is c/o of   Chief Complaint   Patient presents with   ??? Pharyngitis     x 8 days, tried mucnix   ??? Cough     x 8 days   ??? Otalgia     Rt side x 8 days   ??? Sinusitis     x 8 days, green discharge     Have you seen any other physician or provider since your last visit yes - Dr. Assunta [name]    Have you had any other diagnostic tests since your last visit? yes - ENG, hearing test and ABR    Have you changed or stopped any medications since your last visit including any over-the-counter medicines, vitamins, or herbal medicines? no     Are you taking all your prescribed medications? Yes  If NO, why? -  N/A           Patient Self-Management Goal for this visit.   What is your goal for your visit today? - Sore throat,ear pain and sinus   Barriers to success: none   Plan for overcoming my barriers: N/A      Confidence: 10/10   Date goal set: 03/23/2014   Date expected to reach goal: 7days    There are no preventive care reminders to display for this patient.      HPI:     HPI Comments: Went to [place] , came home with uri type symptoms  R hearing loss, [facility] being eval by Dr Brandy [name], needs MRI to r/o neuroma  Having some R ear pain over the past few days, drainage, cough, sore throat    Pharyngitis  This is a new problem. The current episode started in the past 7 days. The problem occurs daily. The problem has been unchanged. Associated symptoms include congestion, coughing, a sore throat, swollen glands and vertigo. Pertinent negatives include no arthralgias, chest pain, chills, fatigue, fever, headaches, myalgias, nausea or rash. The symptoms are aggravated by coughing. Treatments tried: lozanges, tessalon perles, and mucinex. The treatment  provided mild relief.       A1c (%)   Date Value   12/14/2010 7.0*        Hemoglobin A1C (%)   Date Value   02/23/2014 4.2    05/31/2013 6.3*   07/15/2012 5.7              ( goal A1C is < 7)   Microalb/Crt. Ratio (mcg/mg creat)   Date Value   05/31/2013 11      LDL Cholesterol (mg/dL)   Date Value   04/01/7845 120    07/15/2012 139*   12/14/2010 129*       (goal LDL is <100)   AST (U/L)   Date Value   05/31/2013 17         ALT (U/L)   Date Value   05/31/2013 14         BUN (mg/dL)   Date Value   07/04/2951 15      BP Readings from Last 3 Encounters:   03/23/14 138/88   01/31/14 124/72   12/10/13 144/80          (goal 120/80)  Past Medical History   Diagnosis Date   ??? Hay fever    ??? Osteoarthritis    ??? Anxiety    ??? Hyperlipidemia    ??? Headache(784.0)    ??? Hypertension    ??? Depression    ??? DM (diabetes mellitus) (HCC)    ??? Urinary incontinence    ??? Unspecified sleep apnea       Past Surgical History   Procedure Laterality Date   ??? Carpal tunnel release Right 1983   ??? Knee cartilage surgery Right 1973   ??? Cyst removal Right      x2   ??? Hysterectomy  1991   ??? Hernia repair Left 1990     femoral   ??? Neuroma surgery Left 1990     hernia site   ??? Breast lumpectomy Right    ??? Cardiac catheterization  2005,2010   ??? Knee arthroplasty Left    ??? Total knee arthroplasty Left 2013       Family History   Problem Relation Age of Onset   ??? Osteoporosis Mother    ??? Arthritis Mother    ??? Stroke Mother        History   Substance Use Topics   ??? Smoking status: Never Smoker    ??? Smokeless tobacco: Never Used   ??? Alcohol Use: Yes      Current Outpatient Prescriptions   Medication Sig Dispense Refill   ??? venlafaxine (EFFEXOR XR) 150 MG XR capsule Take 1 capsule by mouth daily  30 capsule  6   ??? cefUROXime (CEFTIN) 500 MG tablet Take 1 tablet by mouth 2 times daily  20 tablet  0   ??? meloxicam (MOBIC) 15 MG tablet TAKE 1 TABLET BY MOUTH EVERY DAY  30 tablet  0   ??? meloxicam (MOBIC) 15 MG tablet Take 1 tablet by mouth daily.  90 tablet  3   ??? dicyclomine  (BENTYL) 10 MG capsule Take 1 capsule by mouth 4 times daily (before meals and nightly).  120 capsule  3   ??? metFORMIN (GLUCOPHAGE) 500 MG tablet Take 1 tablet by mouth daily (with breakfast).  90 tablet  3   ??? ranitidine (ZANTAC) 150 MG tablet Take 1 tablet by mouth 2 times daily.  180 tablet  3   ??? levothyroxine (SYNTHROID) 200 MCG tablet Take 1 tablet by mouth Daily.  90 tablet  3   ??? meclizine (ANTIVERT) 25 MG tablet Take 25 mg by mouth 3 times daily as needed.       ??? citalopram (CELEXA) 20 MG tablet Take 2 tablets by mouth every morning.  90 tablet  3   ??? pravastatin (PRAVACHOL) 40 MG tablet Take 1 tablet by mouth every evening.  90 tablet  3   ??? repaglinide (PRANDIN) 1 MG tablet Take 1 tablet by mouth 3 times daily (before meals).  270 tablet  3   ??? LORazepam (ATIVAN) 1 MG tablet Take 1 tablet by mouth as needed for Anxiety.  30 tablet  1   ??? dexlansoprazole (DEXILANT) 60 MG CPDR capsule Take 60 mg by mouth daily.       ??? aspirin 81 MG tablet Take 81 mg by mouth daily.       ??? ketorolac (TORADOL) 60 MG/2ML injection Inject 2 mLs into the muscle once for 1 dose.  2 mL  0     No current facility-administered medications for this visit.     Allergies   Allergen Reactions   ???  Ciprofloxacin Itching     IV cipro only   ??? Demerol Hcl [Meperidine]    ??? Tetanus Toxoids        Health Maintenance   Topic Date Due   ??? FALLS RISK  04/28/2014   ??? BREAST CANCER SCREENING  05/17/2014   ??? LIPIDS  05/31/2014   ??? DEPRESSION SCREENING  06/17/2014   ??? FLU VACCINE YEARLY (ADULT)  06/28/2014   ??? HEMOGLOBIN A1C  08/25/2014   ??? OPHTHALMOLOGY EXAM  09/27/2014   ??? FOOT EXAM  02/01/2015   ??? MICROALBUMINURIA  02/24/2015   ??? CERVICAL CANCER SCREENING  04/28/2016   ??? DEXA SCAN WOMEN  01/09/2018   ??? TETANUS VACCINE ADULT (11 YEARS AND UP)  04/29/2023   ??? COLON CANCER SCREENING COLONOSCOPY  04/29/2023   ??? ZOSTAVAX VACCINE  Addressed   ??? PNEUMOVAX VACCINE ADULT  Addressed       Subjective:      Review of Systems   Constitutional: Negative for  fever, chills and fatigue.   HENT: Positive for congestion, postnasal drip, rhinorrhea and sore throat.    Eyes: Negative for photophobia and visual disturbance.   Respiratory: Positive for cough. Negative for shortness of breath.    Cardiovascular: Negative for chest pain, palpitations and leg swelling.   Gastrointestinal: Negative for nausea, diarrhea and constipation.   Endocrine: Negative for polydipsia and polyuria.   Genitourinary: Negative for dysuria and urgency.   Musculoskeletal: Negative for myalgias and arthralgias.   Skin: Negative for rash and wound.   Allergic/Immunologic: Negative for environmental allergies and immunocompromised state.   Neurological: Positive for dizziness and vertigo. Negative for light-headedness and headaches.        Vertigo has been improved   Hematological: Negative for adenopathy. Does not bruise/bleed easily.   Psychiatric/Behavioral: Negative for sleep disturbance. The patient is not nervous/anxious.        Objective:     Physical Exam   Constitutional: She is oriented to person, place, and time. She appears well-developed and well-nourished.   HENT:   Head: Normocephalic and atraumatic.   Right Ear: Hearing normal. Tympanic membrane is injected and bulging.   Left Ear: Hearing normal. Tympanic membrane is bulging. Tympanic membrane is not injected.   Nose: Mucosal edema and rhinorrhea present. Right sinus exhibits no maxillary sinus tenderness and no frontal sinus tenderness. Left sinus exhibits no maxillary sinus tenderness and no frontal sinus tenderness.   Mouth/Throat: Uvula is midline and mucous membranes are normal. Posterior oropharyngeal edema and posterior oropharyngeal erythema present.   Eyes: Conjunctivae are normal. Pupils are equal, round, and reactive to light.   Neck: Normal range of motion. Neck supple.   Cardiovascular: Normal rate, regular rhythm and normal heart sounds.    No murmur heard.  Pulmonary/Chest: Effort normal and breath sounds normal.    Abdominal: Soft. Bowel sounds are normal.   Musculoskeletal: Normal range of motion. She exhibits no edema.   Lymphadenopathy:        Head (right side): Tonsillar adenopathy present.        Head (left side): Tonsillar adenopathy present.     She has no cervical adenopathy.   Neurological: She is alert and oriented to person, place, and time. No cranial nerve deficit.   Skin: Skin is warm and dry.   Psychiatric: She has a normal mood and affect. Her behavior is normal.   Nursing note and vitals reviewed.    BP 138/88    Pulse 78  Resp 16    Ht 5\' 6"  (1.676 m)    Wt 274 lb 6.4 oz (124.467 kg)    BMI 44.31 kg/m2       Assessment:      1. AOM (acute otitis media)    2. URI, acute    3. Pharyngitis    4. DM (diabetes mellitus) (HCC)              Plan:      Return if symptoms worsen or fail to improve.    No orders of the defined types were placed in this encounter.     Orders Placed This Encounter   Medications   ??? venlafaxine (EFFEXOR XR) 150 MG XR capsule     Sig: Take 1 capsule by mouth daily     Dispense:  30 capsule     Refill:  6   ??? cefUROXime (CEFTIN) 500 MG tablet     Sig: Take 1 tablet by mouth 2 times daily     Dispense:  20 tablet     Refill:  0      ceftin bid x 10 days  Add zyrtec  Increase fluids  Tylenol/ibuprofen   Refill effexor  Call if symptoms do not improve or worsen  F/u ENT  F/u prn     Patient given educational materials - see patient instructions.  Discussed use, benefit, and side effects of prescribed medications.  All patient questions answered. Pt voiced understanding. Reviewed health maintenance.  Instructed to continue current medications, diet and exercise.  Patient agreed with treatment plan. Follow up as directed.     Electronically signed by Laurena Slimmer, NP on 03/23/2014 at 3:27 PM

## 2014-03-23 NOTE — Patient Instructions (Signed)
Ear Infection (Otitis Media): After Your Visit  Your Care Instructions     An ear infection may start with a cold and affect the middle ear (otitis media). It can hurt a lot. Most ear infections clear up on their own in a couple of days. Most often you will not need antibiotics. This is because many ear infections are caused by a virus. Antibiotics don't work against a virus. Regular doses of pain medicines are the best way to reduce your fever and help you feel better.  Follow-up care is a key part of your treatment and safety. Be sure to make and go to all appointments, and call your doctor if you are having problems. It's also a good idea to know your test results and keep a list of the medicines you take.  How can you care for yourself at home?  ?? Take pain medicines exactly as directed.  ?? If the doctor gave you a prescription medicine for pain, take it as prescribed.  ?? If you are not taking a prescription pain medicine, take an over-the-counter medicine, such as acetaminophen (Tylenol), ibuprofen (Advil, Motrin), or naproxen (Aleve). Read and follow all instructions on the label.  ?? Do not take two or more pain medicines at the same time unless the doctor told you to. Many pain medicines have acetaminophen, which is Tylenol. Too much acetaminophen (Tylenol) can be harmful.  ?? Plan to take a full dose of pain reliever before bedtime. Getting enough sleep will help you get better.  ?? Try a warm, moist washcloth on the ear. It may help relieve pain.  ?? If your doctor prescribed antibiotics, take them as directed. Do not stop taking them just because you feel better. You need to take the full course of antibiotics.  When should you call for help?  Call your doctor now or seek immediate medical care if:  ?? You have new or increasing ear pain.  ?? You have new or increasing pus or blood draining from your ear.  ?? You have a fever with a stiff neck or a severe headache.  Watch closely for changes in your health,  and be sure to contact your doctor if:  ?? You have new or worse symptoms.  ?? You are not getting better after taking an antibiotic for 2 days.   Where can you learn more?   Go to [email or web address] and sign in to your MyChart account. Enter X558 in the Search Health Information box to learn more about ???Ear Infection (Otitis Media): After Your Visit.???    If you do not have an account, please click on the ???Sign Up Now??? link.     ?? 2006-2015 [employer]. Care instructions adapted under license by [facility]. This care instruction is for use with your licensed healthcare professional. If you have questions about a medical condition or this instruction, always ask your healthcare professional. [employer] disclaims any warranty or liability for your use of this information.  Content Version: 10.4.390249; Current as of: September 10, 2013

## 2014-04-01 ENCOUNTER — Ambulatory Visit (HOSPITAL_COMMUNITY)
Admission: RE | Admit: 2014-04-01 | Discharge: 2014-04-01 | Disposition: A | Discharge status: Home / Self Care | Payer: Medicare Other | Source: Ambulatory Visit | Attending: Urology | Admitting: Urology

## 2014-04-01 ENCOUNTER — Other Ambulatory Visit (HOSPITAL_COMMUNITY): Payer: Self-pay | Admitting: Urology

## 2014-04-01 DIAGNOSIS — Z9089 Acquired absence of other organs: Secondary | ICD-10-CM | POA: Insufficient documentation

## 2014-04-01 DIAGNOSIS — M4120 Other idiopathic scoliosis, site unspecified: Secondary | ICD-10-CM | POA: Insufficient documentation

## 2014-04-01 DIAGNOSIS — C649 Malignant neoplasm of unspecified kidney, except renal pelvis: Secondary | ICD-10-CM | POA: Insufficient documentation

## 2014-04-15 NOTE — Telephone Encounter (Signed)
From: Brandy CastillaPatricia A [name]  To: Leanora IvanoffStanley J Orlop, DO  Sent: 04/14/2014 6:30 PM EDT  Subject: Non-Urgent Medical Question    [name] CroonStan, I had an [facility] of my right ear [facility]12-[facility] per Dr Assunta Curtisoma. 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     PJ

## 2014-04-22 NOTE — Telephone Encounter (Signed)
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 I have that stupid cough again and I am tired of it. PJ

## 2014-05-11 MED ORDER — CITALOPRAM HYDROBROMIDE 40 MG PO TABS
40 MG | ORAL_TABLET | ORAL | Status: DC
Start: 2014-05-11 — End: 2014-09-30

## 2014-05-12 MED ORDER — VENLAFAXINE HCL ER 150 MG PO CP24
150 MG | ORAL_CAPSULE | ORAL | Status: DC
Start: 2014-05-12 — End: 2014-06-02

## 2014-06-02 MED ORDER — DICYCLOMINE HCL 10 MG PO CAPS
10 MG | ORAL_CAPSULE | Freq: Four times a day (QID) | ORAL | Status: DC
Start: 2014-06-02 — End: 2015-07-25

## 2014-06-02 MED ORDER — VALSARTAN 40 MG PO TABS
40 MG | ORAL_TABLET | Freq: Every day | ORAL | Status: DC
Start: 2014-06-02 — End: 2014-06-10

## 2014-06-02 MED ORDER — CYCLOBENZAPRINE HCL 5 MG PO TABS
5 MG | ORAL_TABLET | Freq: Three times a day (TID) | ORAL | Status: AC | PRN
Start: 2014-06-02 — End: 2014-06-12

## 2014-06-02 MED ORDER — VENLAFAXINE HCL ER 150 MG PO CP24
150 MG | ORAL_CAPSULE | Freq: Every day | ORAL | Status: DC
Start: 2014-06-02 — End: 2014-09-30

## 2014-06-02 MED ORDER — SOLIFENACIN SUCCINATE 5 MG PO TABS
5 MG | ORAL_TABLET | Freq: Every day | ORAL | Status: DC
Start: 2014-06-02 — End: 2014-07-18

## 2014-06-02 NOTE — Progress Notes (Signed)
[facility]  [facility]  [address]  [address]  [place] [id number]  Dept: [phone]  Dept Fax: [phone]    Brandy [name] is a 68 y.o. female who presents today for her medical conditions/complaints as noted below.  Brandy CastillaPatricia A [name] is c/o of   Chief Complaint   Patient presents with   ??? Back Pain     right side   ??? Other     mammogram order   ??? Headache   ??? Medication Refill     bentyl   ??? Excessive Sweating     Have you seen any other physician or provider since your last visit yes - Dr. Assunta Curtisoma, Dr. Marjie Skiffothhaas    Have you had any other diagnostic tests since your last visit? no    Have you changed or stopped any medications since your last visit including any over-the-counter medicines, vitamins, or herbal medicines? no     Are you taking all your prescribed medications? No  If NO, why? -  N/A           Patient Self-Management Goal for this visit.   What is your goal for your visit today? - med refill, headaches, back pian ride side, mammogram order   Barriers to success: none   Plan for overcoming my barriers: N/A      Confidence: 10/10   Date goal set: 06/02/2014   Date expected to reach goal: 1week    Medical history Review  Past Medical, Family, and Social History reviewed and does not contribute to the patient presenting condition    Health Maintenance Due   Topic Date Due   ??? FALLS RISK  04/28/2014   ??? BREAST CANCER SCREENING  05/17/2014   ??? LIPIDS  05/31/2014   ??? DEPRESSION SCREENING  06/17/2014   ??? 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  The current episode started more than 1 year ago. The problem occurs intermittently. The problem has been unchanged. Associated symptoms include arthralgias, headaches and myalgias. Pertinent negatives include no abdominal pain, chest pain, coughing, fatigue, fever, nausea, rash, sore throat or visual change. Associated symptoms comments: + headache almost every day, has had bp elevation lately. Nothing aggravates the symptoms. She has tried nothing for the symptoms. The treatment provided mild relief.       A1c (%)   Date Value   12/14/2010 7.0*        Hemoglobin A1C (%)   Date Value   02/23/2014 4.2    05/31/2013 6.3*   07/15/2012 5.7              ( goal A1C is < 7)   Microalb/Crt. Ratio (mcg/mg creat)   Date Value   05/31/2013 11      LDL Cholesterol (mg/dL)   Date Value   1/0/27258/01/2013 120    07/15/2012 139*   12/14/2010 129*       (goal LDL is <  100)   AST (U/L)   Date Value   05/31/2013 17         ALT (U/L)   Date Value   05/31/2013 14         BUN (mg/dL)   Date Value   11/02/1094 15      BP Readings from Last 3 Encounters:   06/02/14 140/80   03/23/14 138/88   01/31/14 124/72          (goal 120/80)    Past Medical History   Diagnosis Date   ??? Hay fever    ??? Osteoarthritis    ??? Anxiety    ??? Hyperlipidemia    ??? Headache(784.0)    ??? Hypertension    ??? Depression    ??? DM (diabetes mellitus) (HCC)    ??? Urinary incontinence    ??? Unspecified sleep apnea       Past Surgical History   Procedure Laterality Date   ??? Carpal tunnel release Right 1983   ??? Knee cartilage surgery Right 1973   ??? Cyst removal Right      x2   ??? Hysterectomy  1991   ??? Hernia repair Left 1990     femoral   ??? Neuroma surgery Left 1990     hernia site   ??? Breast lumpectomy Right    ??? Cardiac catheterization  2005,2010   ??? Knee arthroplasty Left    ??? Total knee arthroplasty Left 2013       Family History   Problem Relation Age of Onset   ??? Osteoporosis Mother    ??? Arthritis Mother    ??? Stroke Mother        History   Substance Use Topics   ??? Smoking status: Never Smoker     ??? Smokeless tobacco: Never Used   ??? Alcohol Use: Yes      Current Outpatient Prescriptions   Medication Sig Dispense Refill   ??? dicyclomine (BENTYL) 10 MG capsule Take 1 capsule by mouth 4 times daily (before meals and nightly)  120 capsule  3   ??? venlafaxine (EFFEXOR XR) 150 MG XR capsule Take 1 capsule by mouth daily  90 capsule  6   ??? valsartan (DIOVAN) 40 MG tablet Take 1 tablet by mouth daily  30 tablet  3   ??? cyclobenzaprine (FLEXERIL) 5 MG tablet Take 1 tablet by mouth every 8 hours as needed for Muscle spasms  10 tablet  0   ??? solifenacin (VESICARE) 5 MG tablet Take 1 tablet by mouth daily  30 tablet  3   ??? citalopram (CELEXA) 40 MG tablet TAKE 1 TABLET BY MOUTH EVERY NIGHT AT BEDTIME  90 tablet  0   ??? meloxicam (MOBIC) 15 MG tablet Take 1 tablet by mouth daily.  90 tablet  3   ??? metFORMIN (GLUCOPHAGE) 500 MG tablet Take 1 tablet by mouth daily (with breakfast).  90 tablet  3   ??? ranitidine (ZANTAC) 150 MG tablet Take 1 tablet by mouth 2 times daily.  180 tablet  3   ??? levothyroxine (SYNTHROID) 200 MCG tablet Take 1 tablet by mouth Daily.  90 tablet  3   ??? meclizine (ANTIVERT) 25 MG tablet Take 25 mg by mouth 3 times daily as needed.       ??? pravastatin (PRAVACHOL) 40 MG tablet Take 1 tablet by mouth every evening.  90 tablet  3   ??? repaglinide (PRANDIN) 1 MG tablet Take 1 tablet by mouth 3  times daily (before meals).  270 tablet  3   ??? LORazepam (ATIVAN) 1 MG tablet Take 1 tablet by mouth as needed for Anxiety.  30 tablet  1   ??? aspirin 81 MG tablet Take 81 mg by mouth daily.       ??? HYDROcodone-acetaminophen (NORCO) 5-325 MG per tablet     0     No current facility-administered medications for this visit.     Allergies   Allergen Reactions   ??? Ciprofloxacin Itching     IV cipro only   ??? Demerol Hcl [Meperidine]    ??? Tetanus Toxoids        Health Maintenance   Topic Date Due   ??? FALLS RISK  04/28/2014   ??? BREAST CANCER SCREENING  05/17/2014   ??? LIPIDS  05/31/2014   ??? DEPRESSION SCREENING  06/17/2014   ???  FLU VACCINE YEARLY (ADULT)  06/28/2014   ??? HEMOGLOBIN A1C  08/25/2014   ??? OPHTHALMOLOGY EXAM  09/27/2014   ??? FOOT EXAM  02/01/2015   ??? DEXA SCAN WOMEN  01/09/2018   ??? TETANUS VACCINE ADULT (11 YEARS AND UP)  04/29/2023   ??? COLON CANCER SCREENING COLONOSCOPY  04/29/2023   ??? ZOSTAVAX VACCINE  Addressed   ??? PNEUMOVAX VACCINE ADULT  Addressed       Subjective:      Review of Systems   Constitutional: Negative for fever and fatigue.   HENT: Negative for rhinorrhea and sore throat.    Eyes: Negative for photophobia and visual disturbance.   Respiratory: Negative for cough and shortness of breath.    Cardiovascular: Negative for chest pain, palpitations and leg swelling.   Gastrointestinal: Negative for nausea, abdominal pain, diarrhea and constipation.   Endocrine: Negative for polydipsia and polyuria.        + sweating   Genitourinary: Positive for urgency. Negative for dysuria, frequency, hematuria, flank pain, enuresis and difficulty urinating.        + stress incontinence at times   Musculoskeletal: Positive for myalgias and arthralgias.   Skin: Negative for rash and wound.   Allergic/Immunologic: Negative for environmental allergies and immunocompromised state.   Neurological: Positive for headaches. Negative for dizziness and light-headedness.   Hematological: Negative for adenopathy. Does not bruise/bleed easily.   Psychiatric/Behavioral: Negative for confusion and sleep disturbance. The patient is not nervous/anxious.         + memory loss at times       Objective:     Physical Exam   Constitutional: She is oriented to person, place, and time. She appears well-developed and well-nourished.   HENT:   Head: Normocephalic and atraumatic.   Eyes: Conjunctivae are normal. Pupils are equal, round, and reactive to light.   Neck: Normal range of motion. Neck supple.   Cardiovascular: Normal rate, regular rhythm and normal heart sounds.    No murmur heard.  Pulmonary/Chest: Effort normal and breath sounds normal.    Abdominal: Soft. Bowel sounds are normal.   Musculoskeletal: Normal range of motion. She exhibits no edema.   Neurological: She is alert and oriented to person, place, and time. No cranial nerve deficit.   Skin: Skin is warm and dry.   Psychiatric: She has a normal mood and affect. Her behavior is normal.   Nursing note and vitals reviewed.    BP 140/80    Pulse 80    Resp 18    Wt 272 lb (123.378 kg)     Assessment:  1. HTN (hypertension)     2. Diabetes type 2, controlled (HCC)  Hemoglobin A1C   3. Overactive bladder     4. Memory loss  Vitamin B12 & Folate   5. IBS (irritable bowel syndrome)  dicyclomine (BENTYL) 10 MG capsule   6. Hypothyroidism  TSH without Reflex   7. Screening for breast cancer  MAM Digital Screen Bilateral   8. Screening mammogram for high-risk patient  MAM Digital Screen Bilateral   9. Screening for cardiovascular condition  Comprehensive Metabolic Panel    Lipid Panel   10. Screening  CBC             Plan:      Return in about 3 months (around 09/02/2014) for diabetes 2.    Orders Placed This Encounter   Procedures   ??? MAM Digital Screen Bilateral     Standing Status: Future      Number of Occurrences:       Standing Expiration Date: 08/03/2015     Order Specific Question:  Reason for exam:     Answer:  screening   ??? CBC     Standing Status: Future      Number of Occurrences:       Standing Expiration Date: 06/02/2015   ??? Comprehensive Metabolic Panel     Standing Status: Future      Number of Occurrences:       Standing Expiration Date: 06/02/2015   ??? Hemoglobin A1C     Standing Status: Future      Number of Occurrences:       Standing Expiration Date: 06/02/2015   ??? Lipid Panel     Standing Status: Future      Number of Occurrences:       Standing Expiration Date: 06/02/2015     Order Specific Question:  Is Patient Fasting?/# of Hours     Answer:  8   ??? TSH without Reflex     Standing Status: Future      Number of Occurrences:       Standing Expiration Date: 06/02/2015   ??? Vitamin B12 & Folate      Standing Status: Future      Number of Occurrences:       Standing Expiration Date: 06/02/2015     Orders Placed This Encounter   Medications   ??? dicyclomine (BENTYL) 10 MG capsule     Sig: Take 1 capsule by mouth 4 times daily (before meals and nightly)     Dispense:  120 capsule     Refill:  3   ??? venlafaxine (EFFEXOR XR) 150 MG XR capsule     Sig: Take 1 capsule by mouth daily     Dispense:  90 capsule     Refill:  6   ??? valsartan (DIOVAN) 40 MG tablet     Sig: Take 1 tablet by mouth daily     Dispense:  30 tablet     Refill:  3   ??? cyclobenzaprine (FLEXERIL) 5 MG tablet     Sig: Take 1 tablet by mouth every 8 hours as needed for Muscle spasms     Dispense:  10 tablet     Refill:  0   ??? solifenacin (VESICARE) 5 MG tablet     Sig: Take 1 tablet by mouth daily     Dispense:  30 tablet     Refill:  3      Add diovan  Cont monitor blood  sugars  Enc healthy diet/exercise  Will trial vesicare to see if helps with profuse sweating as well as overactive bladder issues/urge incontinence  Med refills  Mammogram  Baseline labs  Call w questions/concerns  F/u 3 mos     Patient given educational materials - see patient instructions.  Discussed use, benefit, and side effects of prescribed medications.  All patient questions answered. Pt voiced understanding. Reviewed health maintenance.  Instructed to continue current medications, diet and exercise.  Patient agreed with treatment plan. Follow up as directed.     Electronically signed by Laurena Slimmer, NP on 06/02/2014 at 5:36 PM

## 2014-06-05 IMAGING — CR DG CHEST 2V
2 series · 2 of 2 positions shown · non-contrast
Comparison: 03/19/2012.

CLINICAL DATA: Fell.

CHEST - 2 VIEW

[w chest lat]
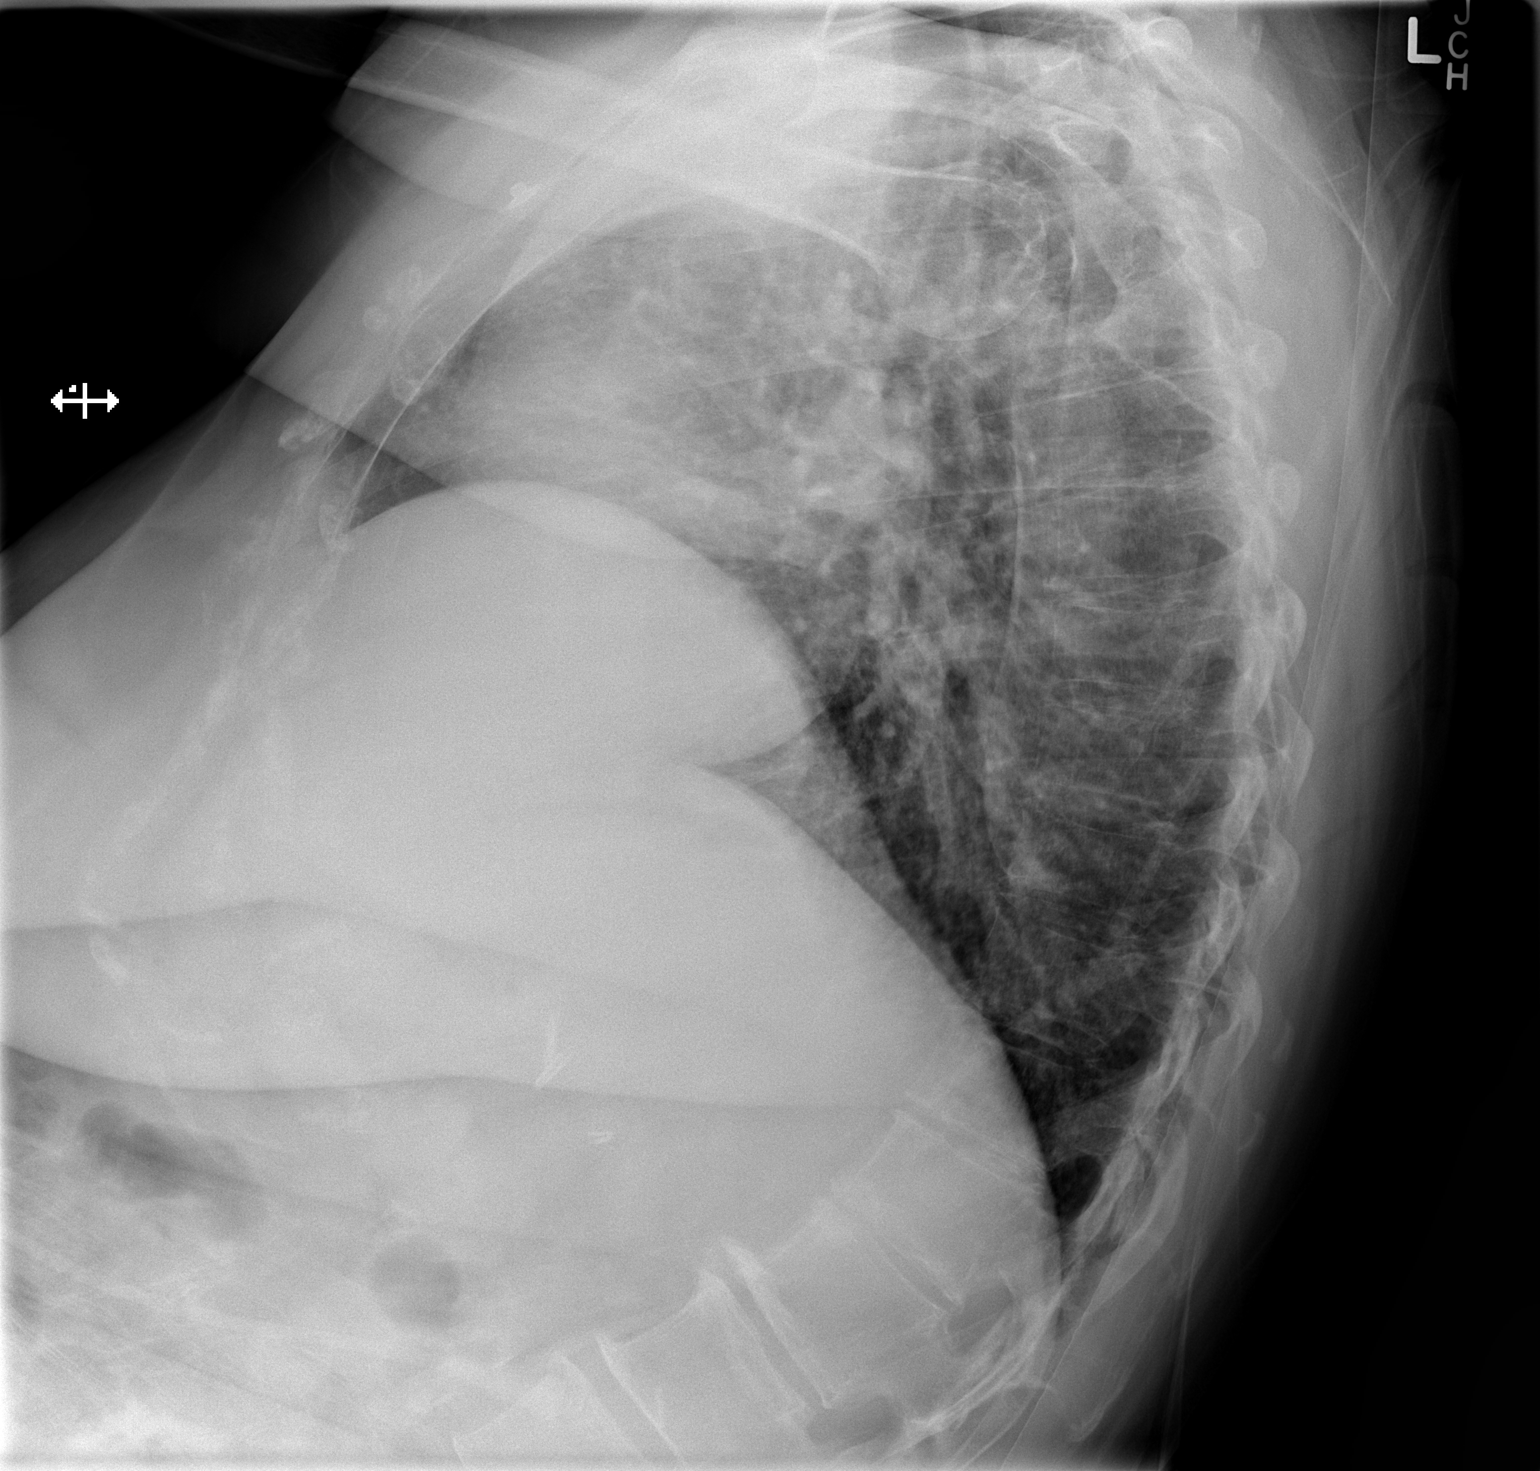

[x chest ap]
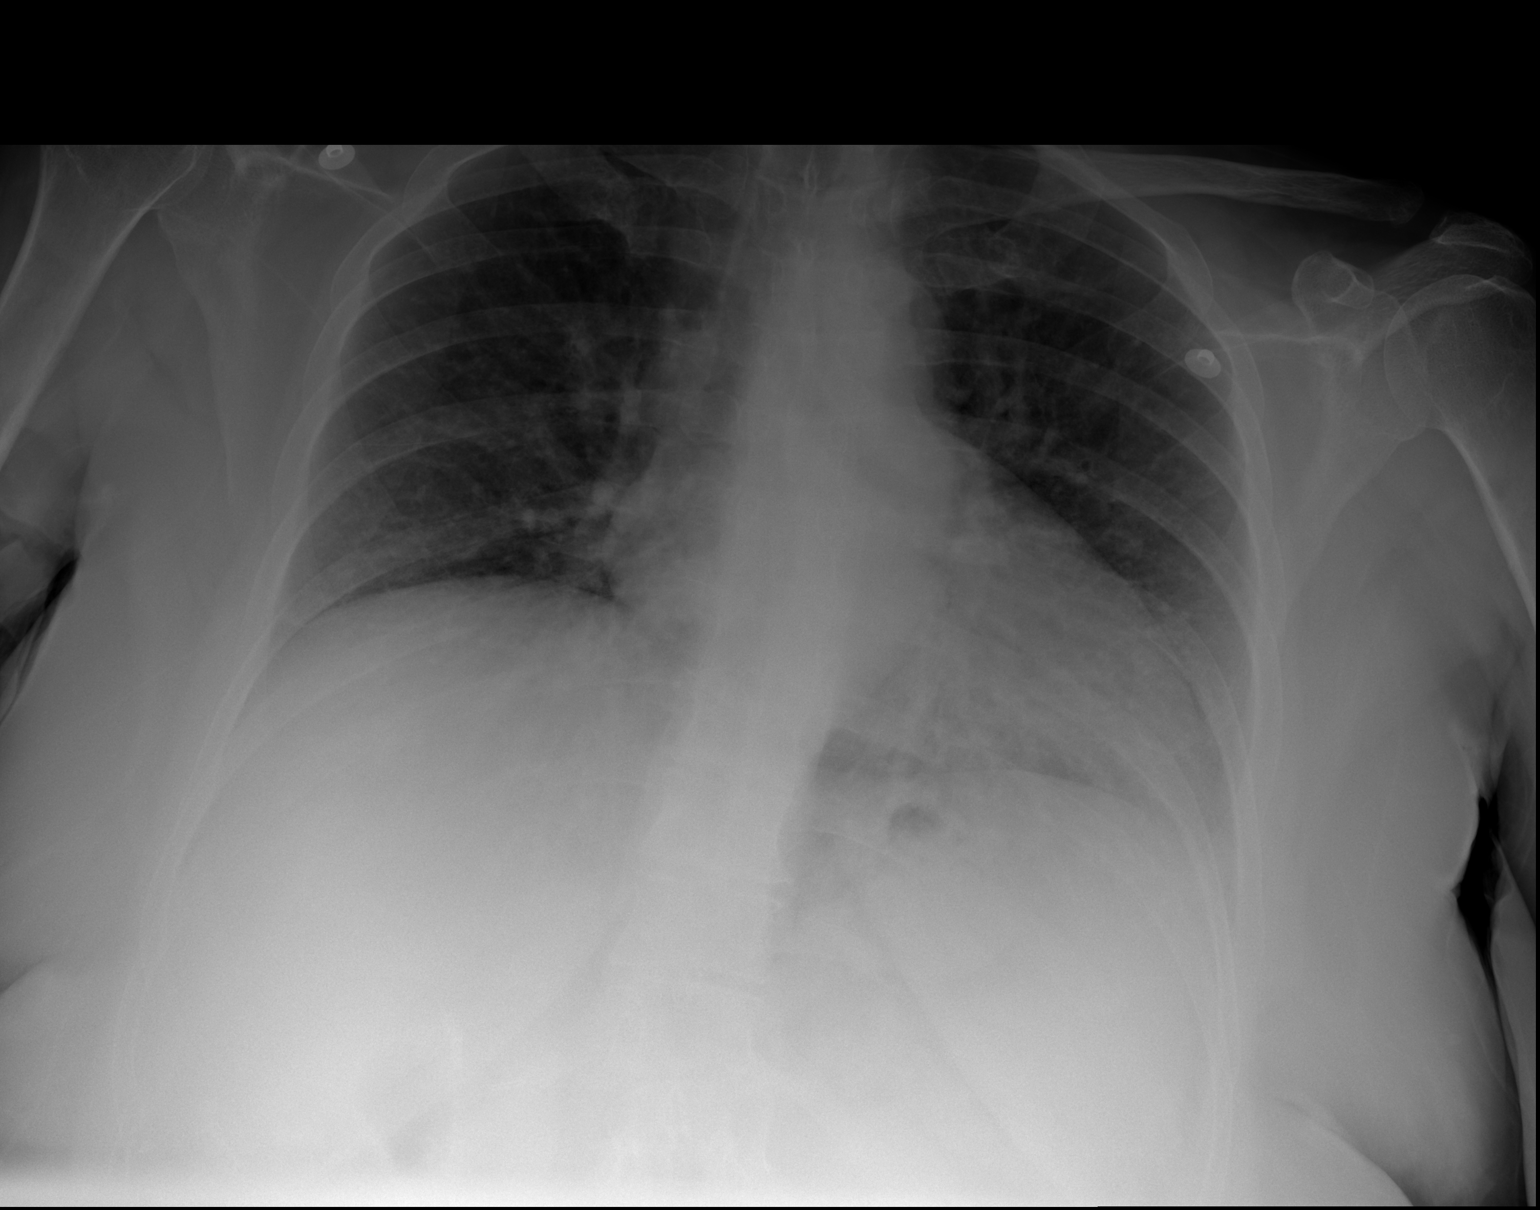

[2 of 2 positions shown; findings below may reference images not displayed]

FINDINGS: The heart is mildly enlarged but stable.  Very low lung
volumes with vascular crowding and atelectasis.  Underlying chronic
bronchitic changes are noted.  No pleural effusion.  No
pneumothorax.  The bony thorax is grossly intact.
IMPRESSION: Low lung volumes with vascular crowding and atelectasis.

## 2014-06-10 MED ORDER — VALSARTAN 40 MG PO TABS
40 MG | ORAL_TABLET | Freq: Every day | ORAL | Status: DC
Start: 2014-06-10 — End: 2014-10-12

## 2014-06-10 NOTE — Telephone Encounter (Signed)
Pharm never received diovan, resent

## 2014-06-16 NOTE — Telephone Encounter (Signed)
Error

## 2014-07-01 NOTE — Progress Notes (Signed)
Message sent via MyChart

## 2014-07-01 NOTE — Progress Notes (Signed)
Message sent MyChart

## 2014-07-01 NOTE — Telephone Encounter (Signed)
From: [name]  Sent: 07/01/2014 9:39 AM EDT  To: [facility] Clinical Staff  Subject: ZO:XWRU    I am waiting until next month. How are you? I miss you  ----- Message -----  From: Josetta Huddle  Sent: 07/01/2014 7:47 AM EDT  To: [name]  Subject: labs    Hi PJ! I was checking to see if you had done your labs from 08/06 visit or are you waiting until closer to your 3 month F/U?  Have a great holiday!  Wynona Canes

## 2014-07-05 ENCOUNTER — Other Ambulatory Visit: Payer: Self-pay

## 2014-07-05 DIAGNOSIS — Z1231 Encounter for screening mammogram for malignant neoplasm of breast: Secondary | ICD-10-CM

## 2014-07-18 MED ORDER — REPAGLINIDE 1 MG PO TABS
1 MG | ORAL_TABLET | Freq: Three times a day (TID) | ORAL | Status: DC
Start: 2014-07-18 — End: 2015-09-22

## 2014-07-18 MED ORDER — SOLIFENACIN SUCCINATE 5 MG PO TABS
5 MG | ORAL_TABLET | Freq: Every day | ORAL | Status: DC
Start: 2014-07-18 — End: 2014-07-25

## 2014-07-18 NOTE — Telephone Encounter (Signed)
Patient requested refills on vesicare 5 mg and prandin 1 mg, LV 06/02/14.

## 2014-07-20 ENCOUNTER — Ambulatory Visit
Admission: RE | Admit: 2014-07-20 | Discharge: 2014-07-20 | Disposition: A | Discharge status: Home / Self Care | Payer: 59 | Source: Ambulatory Visit

## 2014-07-20 ENCOUNTER — Encounter (INDEPENDENT_AMBULATORY_CARE_PROVIDER_SITE_OTHER): Payer: Self-pay

## 2014-07-20 DIAGNOSIS — Z1231 Encounter for screening mammogram for malignant neoplasm of breast: Secondary | ICD-10-CM

## 2014-07-21 LAB — CBC

## 2014-07-21 LAB — TSH: TSH: 3.5 u[IU]/mL

## 2014-07-21 LAB — LIPID PANEL
Chol/HDL Ratio: 6.5
Cholesterol, Total: 215
HDL: 33 mg/dL — AB (ref 35–70)
LDL Calculated: 138 mg/dL (ref 0–160)
Triglycerides: 222 mg/dL
VLDL: 44 mg/dL

## 2014-07-21 LAB — HEMOGLOBIN A1C: Hemoglobin A1C: 7.7 %

## 2014-07-22 ENCOUNTER — Encounter

## 2014-07-24 NOTE — Telephone Encounter (Signed)
See result note--  Adding zetia, needs increase metformin to 500 mg bid  Needs a1c at next office visit

## 2014-07-25 MED ORDER — SOLIFENACIN SUCCINATE 5 MG PO TABS
5 MG | ORAL_TABLET | Freq: Every day | ORAL | Status: DC
Start: 2014-07-25 — End: 2014-10-12

## 2014-07-25 MED ORDER — EZETIMIBE 10 MG PO TABS
10 MG | ORAL_TABLET | Freq: Every day | ORAL | Status: DC
Start: 2014-07-25 — End: 2014-12-23

## 2014-07-25 NOTE — Telephone Encounter (Signed)
Called pt to let her know about lab results and medication add on and increase Metformin. Pt stated she called on the [address] and told Delaney Meigsamara she needed 90 script on Vesicare and only 30 was sent. Resent 90 day today.

## 2014-07-26 NOTE — Telephone Encounter (Signed)
Error

## 2014-07-26 NOTE — Telephone Encounter (Signed)
Notes Recorded by Laurena Slimmerheryl L Jeffers, NP on 07/25/2014 at 4:23 PM  Will need to closely monitor, increase in a1c from recent med changes, can we see if invokana or tradjenta or onglyza would be covered she was on [place] with great results but no longer was covered?      Looked up [facility] script formulary list online, invokana, Meryl Crutchjanumet,januvia and onglyza are all covered bye insurance but they are brand name drugs so they are more expensive. Will notify the pt and suggest she contact insurance for cost details.

## 2014-07-27 NOTE — Progress Notes (Signed)
[facility] INTERNAL MEDICINE  [address]  [address]  [place] [address]  Dept: [phone]  Dept Fax: [phone]    Brandy [name] is a 68 y.o. female who presents today for her medical conditions/complaints as noted below.  Brandy [name] is c/o of   Chief Complaint   Patient presents with   ??? Other     burning ride side of ABD on the top of stomach, nauseated    ??? Other     wants to talk about labs, A1C to high.     Have you seen any other physician or provider since your last visit no    Have you had any other diagnostic tests since your last visit? yes - labs    Have you changed or stopped any medications since your last visit including any over-the-counter medicines, vitamins, or herbal medicines? no     Are you taking all your prescribed medications? Yes  If NO, why? -  N/A           Patient Self-Management Goal for this visit.   What is your goal for your visit today? - burning pain in upper ride side of ABD   Barriers to success: none   Plan for overcoming my barriers: N/A      Confidence: 10/10   Date goal set: 07/27/14   Date expected to reach goal: 1week    Medical history Review  Past Medical, Family, and Social History reviewed and does contribute to the patient presenting condition    Health Maintenance Due   Topic Date Due   ??? PNEUMO VAC >= 65 (2 of 2 - PPSV23) 10/29/2013   ??? FALLS RISK  04/28/2014   ??? DEPRESSION SCREENING  06/17/2014   ??? BREAST CANCER SCREENING  05/17/2014   ??? FLU VACCINE YEARLY (ADULT)  06/28/2014         HPI:     HPI Comments: Pt here to discuss abd pain/burning  Epigastric burning into upper chest  Has taken pepcid and zantac with mild relief  No pain -- no nausea - has not used any PPI  Has not seen GI   Burning intermittent -- but more that 2 times a week  Pt also wants to stop taking medication not needed anymore  Pt does not feel she needs anti-depressant medication anymore  Her mood has been much improved since retiring --  denies any crying spells, no sleep issues  Pt has been forgetting to take her prandin 3 times a day - -usually remembers twice  BS checks at home usually less than 100   Pt has never taken zetia - -would not like to take anything else for cholesterol       Gastroesophageal Reflux  She complains of abdominal pain, belching, heartburn and nausea. She reports no chest pain, no coughing, no dysphagia, no early satiety, no hoarse voice, no sore throat, no stridor, no tooth decay, no water brash or no wheezing. This is a recurrent problem. The current episode started 1 to 4 weeks ago. The problem occurs frequently. The problem has been gradually worsening. The symptoms are aggravated by certain foods. Pertinent negatives include no anemia, fatigue, melena, muscle weakness, orthopnea or weight loss. There are no known risk factors. She has tried an antacid and a histamine-2 antagonist for the symptoms. The treatment provided mild relief. Past procedures do not include an abdominal ultrasound, an EGD, esophageal manometry, esophageal pH monitoring, H.  pylori antibody titer or a UGI.   Diabetes  She presents for her follow-up diabetic visit. She has type 2 diabetes mellitus. Her disease course has been stable. There are no hypoglycemic associated symptoms. Pertinent negatives for hypoglycemia include no dizziness. Pertinent negatives for diabetes include no blurred vision, no chest pain, no fatigue, no polydipsia, no polyphagia, no polyuria, no weakness and no weight loss. There are no hypoglycemic complications. Symptoms are stable. There are no diabetic complications. Risk factors for coronary artery disease include diabetes mellitus, dyslipidemia, obesity, post-menopausal and sedentary lifestyle. Current diabetic treatment includes oral agent (dual therapy). She is compliant with treatment some of the time (forgets to take prandin 3 times a day). Her weight is stable. She is following a diabetic and generally healthy  diet. Meal planning includes avoidance of concentrated sweets. She has not had a previous visit with a dietitian. She never participates in exercise. There is no change in her home blood glucose trend. Her overall blood glucose range is 90-110 mg/dl. An ACE inhibitor/angiotensin II receptor blocker is being taken. She does not see a podiatrist.Eye exam is current.       HEMOGLOBIN A1C (%)   Date Value   07/21/2014 7.7   02/23/2014 4.2   05/31/2013 6.3*     A1C (%)   Date Value   12/14/2010 7.0*             ( goal A1C is < 7)   MICROALB/CRT. RATIO (mcg/mg creat)   Date Value   05/31/2013 11     LDL CHOLESTEROL (mg/dL)   Date Value   57/84/6962 120   07/15/2012 139*   12/14/2010 129*     LDL CALCULATED (mg/dL)   Date Value   95/28/4132 138       (goal LDL is <100)   AST (U/L)   Date Value   05/31/2013 17     ALT (U/L)   Date Value   05/31/2013 14     BUN (mg/dL)   Date Value   44/10/270 15     BP Readings from Last 3 Encounters:   07/27/14 132/78   06/02/14 140/80   03/23/14 138/88          (goal 120/80)    Past Medical History   Diagnosis Date   ??? Hay fever    ??? Osteoarthritis    ??? Anxiety    ??? Hyperlipidemia    ??? Headache(784.0)    ??? Hypertension    ??? Depression    ??? DM (diabetes mellitus) (HCC)    ??? Urinary incontinence    ??? Unspecified sleep apnea       Past Surgical History   Procedure Laterality Date   ??? Carpal tunnel release Right 1983   ??? Knee cartilage surgery Right 1973   ??? Cyst removal Right      x2   ??? Hysterectomy  1991   ??? Hernia repair Left 1990     femoral   ??? Neuroma surgery Left 1990     hernia site   ??? Breast lumpectomy Right    ??? Cardiac catheterization  2005,2010   ??? Knee arthroplasty Left    ??? Total knee arthroplasty Left 2013       Family History   Problem Relation Age of Onset   ??? Osteoporosis Mother    ??? Arthritis Mother    ??? Stroke Mother        History   Substance Use Topics   ???  Smoking status: Never Smoker    ??? Smokeless tobacco: Never Used   ??? Alcohol Use: Yes      Current Outpatient  Prescriptions   Medication Sig Dispense Refill   ??? solifenacin (VESICARE) 5 MG tablet Take 1 tablet by mouth daily 90 tablet 1   ??? metFORMIN (GLUCOPHAGE) 500 MG tablet Take 500 mg by mouth 2 times daily (with meals)     ??? repaglinide (PRANDIN) 1 MG tablet Take 1 tablet by mouth 3 times daily (before meals) 270 tablet 3   ??? valsartan (DIOVAN) 40 MG tablet Take 1 tablet by mouth daily 30 tablet 3   ??? HYDROcodone-acetaminophen (NORCO) 5-325 MG per tablet   0   ??? venlafaxine (EFFEXOR XR) 150 MG XR capsule Take 1 capsule by mouth daily 90 capsule 6   ??? citalopram (CELEXA) 40 MG tablet TAKE 1 TABLET BY MOUTH EVERY NIGHT AT BEDTIME 90 tablet 0   ??? meloxicam (MOBIC) 15 MG tablet Take 1 tablet by mouth daily. 90 tablet 3   ??? ranitidine (ZANTAC) 150 MG tablet Take 1 tablet by mouth 2 times daily. 180 tablet 3   ??? levothyroxine (SYNTHROID) 200 MCG tablet Take 1 tablet by mouth Daily. 90 tablet 3   ??? LORazepam (ATIVAN) 1 MG tablet Take 1 tablet by mouth as needed for Anxiety. 30 tablet 1   ??? aspirin 81 MG tablet Take 81 mg by mouth daily.     ??? ezetimibe (ZETIA) 10 MG tablet Take 1 tablet by mouth daily 30 tablet 3   ??? dicyclomine (BENTYL) 10 MG capsule Take 1 capsule by mouth 4 times daily (before meals and nightly) 120 capsule 3   ??? meclizine (ANTIVERT) 25 MG tablet Take 25 mg by mouth 3 times daily as needed.     ??? pravastatin (PRAVACHOL) 40 MG tablet Take 1 tablet by mouth every evening. 90 tablet 3     No current facility-administered medications for this visit.     Allergies   Allergen Reactions   ??? Ciprofloxacin Itching     IV cipro only   ??? Demerol Hcl [Meperidine]    ??? Tetanus Toxoids        Health Maintenance   Topic Date Due   ??? PNEUMO VAC >= 65 (2 of 2 - PPSV23) 10/29/2013   ??? FALLS RISK  04/28/2014   ??? DEPRESSION SCREENING  06/17/2014   ??? BREAST CANCER SCREENING  05/17/2014   ??? FLU VACCINE YEARLY (ADULT)  06/28/2014   ??? OPHTHALMOLOGY EXAM  09/27/2014   ??? HEMOGLOBIN A1C  01/19/2015   ??? FOOT EXAM  02/01/2015   ???  LIPIDS  07/22/2015   ??? DEXA SCAN WOMEN  01/09/2018   ??? TETANUS VACCINE ADULT (11 YEARS AND UP)  04/29/2023   ??? COLON CANCER SCREENING COLONOSCOPY  04/29/2023   ??? ZOSTAVAX VACCINE  Addressed       Subjective:      Review of Systems   Constitutional: Negative for fever, weight loss, activity change, appetite change, fatigue and unexpected weight change.   HENT: Negative for congestion, hoarse voice, mouth sores and sore throat.    Eyes: Negative.  Negative for blurred vision.   Respiratory: Negative for cough, chest tightness, shortness of breath and wheezing.    Cardiovascular: Negative for chest pain and leg swelling.   Gastrointestinal: Positive for heartburn, nausea and abdominal pain. Negative for dysphagia, vomiting, diarrhea, constipation and melena.   Endocrine: Negative for polydipsia, polyphagia and polyuria.   Genitourinary:  Negative for dysuria, urgency and difficulty urinating.   Musculoskeletal: Negative for myalgias, arthralgias and muscle weakness.   Skin: Negative for rash.   Neurological: Negative for dizziness, syncope, weakness and light-headedness.       Objective:     Physical Exam   Constitutional: She is oriented to person, place, and time. She appears well-developed and well-nourished. No distress.   HENT:   Head: Normocephalic.   Eyes: Pupils are equal, round, and reactive to light. No scleral icterus.   Neck: Normal range of motion. Neck supple. No thyromegaly present.   Cardiovascular: Normal rate, regular rhythm and normal heart sounds.    No murmur heard.  Pulmonary/Chest: Effort normal and breath sounds normal. No respiratory distress. She has no wheezes.   Abdominal: Soft. Bowel sounds are normal. She exhibits no distension. There is tenderness in the epigastric area. There is no rigidity, no guarding, no tenderness at McBurney's point and negative Murphy's sign.   Musculoskeletal: Normal range of motion. She exhibits no edema or tenderness.   Lymphadenopathy:     She has no cervical  adenopathy.   Neurological: She is alert and oriented to person, place, and time. No cranial nerve deficit.   Skin: Skin is warm and dry. No rash noted.   Psychiatric: She has a normal mood and affect. Her behavior is normal.   Nursing note and vitals reviewed.    BP 132/78 mmHg   Pulse 62   Resp 18   Wt 279 lb (126.554 kg)    Assessment:      1. 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 Gastroesophageal reflux disease, esophagitis presence not specified              Plan:      Return in about 3 months (around 10/26/2014), or if symptoms worsen or fail to improve, for DM2, gerd.    No orders of the defined types were placed in this encounter.     No orders of the defined types were placed in this encounter.       Patient given educational materials - see patient instructions.  Discussed use, benefit, and side effects of prescribed medications.  All patient questions answered. Pt voiced understanding. Reviewed health maintenance.  Instructed to continue current medications, diet and exercise.  Patient agreed with treatment plan. Follow up as directed.     Electronically signed by Christie BeckersGinger Thelton Graca, CNP on 07/27/2014 at 11:55 PM     Start OTC nexium or prilosec  May use pepcid or zantac for breakthrough heartburn  Consider starting weekly injection for DM2 (byetta, bydureon) - this may be able to replace prandin  Try to take prandin 3 times a day  Continue monitoring BS daily  Recheck HbgA1C in 3 months  Okay to taper off celexa and effexor -- over the next 2 weeks  Pt continues to decline to take zetia -- pt wants to take as little medication as possible   rto 3 months

## 2014-08-03 MED ORDER — AMOXICILLIN 500 MG PO CAPS
500 MG | ORAL_CAPSULE | Freq: Once | ORAL | Status: DC
Start: 2014-08-03 — End: 2015-03-28

## 2014-08-03 NOTE — Telephone Encounter (Signed)
Sent to pharm

## 2014-08-03 NOTE — Telephone Encounter (Signed)
Pt called stating that she needs pre medication (antibiotic) for her dental procedure in 2 weeks.     Pt states that she usually takes Amoxicillin 2000 mg one dose.    Pt has joint replacement and needs prior to dental work

## 2014-08-03 NOTE — Telephone Encounter (Signed)
msg left on pt v/m that script was called to pharmacy

## 2014-09-06 ENCOUNTER — Inpatient Hospital Stay: Admit: 2014-09-06 | Discharge: 2014-09-13 | Payer: MEDICARE | Attending: Internal Medicine | Primary: Family Medicine

## 2014-09-06 DIAGNOSIS — Z1239 Encounter for other screening for malignant neoplasm of breast: Secondary | ICD-10-CM

## 2014-09-07 MED ORDER — CEPHALEXIN 500 MG PO CAPS
500 MG | ORAL_CAPSULE | Freq: Three times a day (TID) | ORAL | Status: DC
Start: 2014-09-07 — End: 2014-10-12

## 2014-09-07 NOTE — Telephone Encounter (Signed)
Patient thinks she has UTI, having abdominal pressure. There are no available appt today and would like something called in. Please advise.

## 2014-09-07 NOTE — Telephone Encounter (Signed)
Sent in keflex x 7 days, if no better needs culture and appt

## 2014-09-08 NOTE — Telephone Encounter (Signed)
Patient notified.

## 2014-09-19 MED ORDER — RANITIDINE HCL 150 MG PO TABS
150 MG | ORAL_TABLET | ORAL | Status: DC
Start: 2014-09-19 — End: 2014-10-12

## 2014-09-19 MED ORDER — LEVOTHYROXINE SODIUM 200 MCG PO TABS
200 MCG | ORAL_TABLET | ORAL | Status: DC
Start: 2014-09-19 — End: 2015-01-17

## 2014-09-19 MED ORDER — METFORMIN HCL 500 MG PO TABS
500 MG | ORAL_TABLET | ORAL | Status: DC
Start: 2014-09-19 — End: 2014-09-30

## 2014-09-20 ENCOUNTER — Encounter: Admit: 2014-09-20 | Discharge: 2014-09-29 | Payer: MEDICARE | Primary: Family Medicine

## 2014-09-20 ENCOUNTER — Inpatient Hospital Stay: Admit: 2014-09-20 | Discharge: 2014-09-20 | Disposition: A | Discharge status: Home / Self Care

## 2014-09-20 DIAGNOSIS — G43919 Migraine, unspecified, intractable, without status migrainosus: Secondary | ICD-10-CM

## 2014-09-20 LAB — BASIC METABOLIC PANEL
Anion Gap: 15 mmol/L (ref 9–17)
BUN: 10 mg/dL (ref 8–23)
Bun/Cre Ratio: 17 (ref 9–20)
CO2: 24 mmol/L (ref 20–31)
Calcium: 9.8 mg/dL (ref 8.6–10.4)
Chloride: 98 mmol/L (ref 98–107)
Creatinine: 0.6 mg/dL (ref 0.50–0.90)
GFR African American: >60 mL/min (ref >60)
GFR Non-African American: >60 mL/min (ref >60)
Glucose: 194 mg/dL — ABNORMAL HIGH (ref 70–99)
Potassium: 4.2 mmol/L (ref 3.7–5.3)
Sodium: 137 mmol/L (ref 135–144)

## 2014-09-20 LAB — CBC WITH AUTO DIFFERENTIAL
Absolute Eos #: 0.04 10*3/uL (ref 0.0–0.4)
Absolute Lymph #: 1.51 10*3/uL (ref 1.0–4.8)
Absolute Mono #: 0.51 10*3/uL (ref 0.2–0.8)
Basophils Absolute: 0.03 10*3/uL (ref 0.0–0.2)
Basophils: 0 % (ref 0–2)
Eosinophils %: 1 % (ref 1–4)
Hematocrit: 45.2 % (ref 36–46)
Hemoglobin: 14.6 g/dL (ref 12.0–16.0)
Lymphocytes: 17 % — ABNORMAL LOW (ref 24–44)
MCH: 27.9 pg (ref 26–34)
MCHC: 32.2 g/dL (ref 31–37)
MCV: 86.7 fL (ref 80–100)
MPV: 8.4 fL (ref 6.0–12.0)
Monocytes: 6 % (ref 1–7)
Platelets: 254 10*3/uL (ref 130–400)
RBC: 5.22 m/uL — ABNORMAL HIGH (ref 4.0–5.2)
RDW: 12.9 % (ref 11.5–14.5)
Seg Neutrophils: 76 % — ABNORMAL HIGH (ref 36–66)
Segs Absolute: 6.8 10*3/uL (ref 1.8–7.7)
WBC: 8.9 10*3/uL (ref 3.5–11.0)

## 2014-09-20 MED ORDER — KETOROLAC TROMETHAMINE 60 MG/2ML IM SOLN
60 MG/2ML | Freq: Once | INTRAMUSCULAR | Status: AC
Start: 2014-09-20 — End: 2014-09-20
  Administered 2014-09-20: 10:00:00 60 mg via INTRAMUSCULAR

## 2014-09-20 MED ORDER — DIPHENHYDRAMINE HCL 50 MG/ML IJ SOLN
50 MG/ML | Freq: Once | INTRAMUSCULAR | Status: AC
Start: 2014-09-20 — End: 2014-09-20
  Administered 2014-09-20: 11:00:00 25 mg via INTRAVENOUS

## 2014-09-20 MED ORDER — LACTATED RINGERS IV BOLUS
Freq: Once | INTRAVENOUS | Status: AC
Start: 2014-09-20 — End: 2014-09-20
  Administered 2014-09-20: 11:00:00 1000 mL via INTRAVENOUS

## 2014-09-20 MED ORDER — HYDROMORPHONE HCL 1 MG/ML IJ SOLN
1 MG/ML | Freq: Once | INTRAMUSCULAR | Status: AC
Start: 2014-09-20 — End: 2014-09-20
  Administered 2014-09-20: 11:00:00 1 mg via INTRAVENOUS

## 2014-09-20 MED ORDER — BUTALBITAL-APAP-CAFFEINE 50-325-40 MG PO TABS
50-325-40 MG | ORAL_TABLET | ORAL | Status: DC | PRN
Start: 2014-09-20 — End: 2014-09-30

## 2014-09-20 MED ORDER — PROMETHAZINE HCL 25 MG/ML IJ SOLN
25 MG/ML | Freq: Once | INTRAMUSCULAR | Status: AC
Start: 2014-09-20 — End: 2014-09-20
  Administered 2014-09-20: 10:00:00 25 mg via INTRAMUSCULAR

## 2014-09-20 MED FILL — PROMETHAZINE HCL 25 MG/ML IJ SOLN: 25 MG/ML | INTRAMUSCULAR | Qty: 1

## 2014-09-20 MED FILL — KETOROLAC TROMETHAMINE 60 MG/2ML IM SOLN: 60 MG/2ML | INTRAMUSCULAR | Qty: 2

## 2014-09-20 MED FILL — DIPHENHYDRAMINE HCL 50 MG/ML IJ SOLN: 50 MG/ML | INTRAMUSCULAR | Qty: 1

## 2014-09-20 MED FILL — HYDROMORPHONE HCL 1 MG/ML IJ SOLN: 1 MG/ML | INTRAMUSCULAR | Qty: 1

## 2014-09-20 NOTE — Discharge Instructions (Signed)
Migraine Headache: Care Instructions  Your Care Instructions  Migraines are painful, throbbing headaches that often start on one side of the head. They may cause nausea and vomiting and make you sensitive to light, sound, or smell.  Without treatment, migraines can last from 4 hours to a few days. Medicines can help prevent migraines or stop them after they have started. Your doctor can help you find which ones work best for you.  Follow-up care is a key part of your treatment and safety. Be sure to make and go to all appointments, and call your doctor if you are having problems. It's also a good idea to know your test results and keep a list of the medicines you take.  How can you care for yourself at home?   Do not drive if you have taken a prescription pain medicine.   Rest in a quiet, dark room until your headache is gone. Close your eyes, and try to relax or go to sleep. Don't watch TV or read.   Put a cold, moist cloth or cold pack on the painful area for 10 to 20 minutes at a time. Put a thin cloth between the cold pack and your skin.   Use a warm, moist towel or a heating pad set on low to relax tight shoulder and neck muscles.   Have someone gently massage your neck and shoulders.   Take your medicines exactly as prescribed. Call your doctor if you think you are having a problem with your medicine. You will get more details on the specific medicines your doctor prescribes.   Be careful not to take pain medicine more often than the instructions allow. You could get worse or more frequent headaches when the medicine wears off.  To prevent migraines   Keep a headache diary so you can figure out what triggers your headaches. Avoiding triggers may help you prevent headaches. Record when each headache began, how long it lasted, and what the pain was like. (Was it throbbing, aching, stabbing, or dull?) Write down any other symptoms you had with the headache, such as nausea, flashing lights or dark spots,  or sensitivity to bright light or loud noise. Note if the headache occurred near your period. List anything that might have triggered the headache. Triggers may include certain foods (chocolate, cheese, wine) or odors, smoke, bright light, stress, or lack of sleep.   If your doctor has prescribed medicine for your migraines, take it as directed. You may have medicine that you take only when you get a migraine and medicine that you take all the time to help prevent migraines.   If your doctor has prescribed medicine for when you get a headache, take it at the first sign of a migraine, unless your doctor has given you other instructions.   If your doctor has prescribed medicine to prevent migraines, take it exactly as prescribed. Call your doctor if you think you are having a problem with your medicine.   Find healthy ways to deal with stress. Migraines are most common during or right after stressful times. Take time to relax before and after you do something that has caused a migraine in the past.   Try to keep your muscles relaxed by keeping good posture. Check your jaw, face, neck, and shoulder muscles for tension. Try to relax them. When you sit at a desk, change positions often. And make sure to stretch for 30 seconds each hour.   Get plenty of sleep and   exercise.   Eat meals on a regular schedule. Avoid foods and drinks that often trigger migraines. These include chocolate, alcohol (especially red wine and port), aspartame, monosodium glutamate (MSG), and some additives found in foods (such as hot dogs, bacon, cold cuts, aged cheeses, and pickled foods).   Limit caffeine. Don't drink too much coffee, tea, or soda. But don't quit caffeine suddenly. That can also give you migraines.   Do not smoke or allow others to smoke around you. If you need help quitting, talk to your doctor about stop-smoking programs and medicines. These can increase your chances of quitting for good.   If you are taking birth  control pills or hormone therapy, talk to your doctor about whether they are triggering your migraines.  When should you call for help?  Call 911 anytime you think you may need emergency care. For example, call if:   You have signs of a stroke. These may include:   Sudden numbness, paralysis, or weakness in your face, arm, or leg, especially on only one side of your body.   Sudden vision changes.   Sudden trouble speaking.   Sudden confusion or trouble understanding simple statements.   Sudden problems with walking or balance.   A sudden, severe headache that is different from past headaches.  Call your doctor now or seek immediate medical care if:   You have new or worse nausea and vomiting.   You have a new or higher fever.   Your headache gets much worse.  Watch closely for changes in your health, and be sure to contact your doctor if:   You are not getting better after 2 days (48 hours).   Where can you learn more?   Go to [email or web address] and sign in to your MyChart account. Enter U690 in the Search Health Information box to learn more about "Migraine Headache: Care Instructions."    If you do not have an account, please click on the "Sign Up Now" link.      2006-2015 [employer]. Care instructions adapted under license by [facility]. This care instruction is for use with your licensed healthcare professional. If you have questions about a medical condition or this instruction, always ask your healthcare professional. [employer], Inc[employer] disclaims any warranty or liability for your use of this information.  Content Version: 10.6.465758; Current as of: December 17, 2013                Migraine Headache: Care Instructions  Your Care Instructions  Migraines are painful, throbbing headaches that often start on one side of the head. They may cause nausea and vomiting and make you sensitive to light, sound, or smell.  Without treatment, migraines can last from 4  hours to a few days. Medicines can help prevent migraines or stop them after they have started. Your doctor can help you find which ones work best for you.  Follow-up care is a key part of your treatment and safety. Be sure to make and go to all appointments, and call your doctor if you are having problems. It's also a good idea to know your test results and keep a list of the medicines you take.  How can you care for yourself at home?   Do not drive if you have taken a prescription pain medicine.   Rest in a quiet, dark room until your headache is gone. Close your eyes, and try to relax or go to sleep. Don't watch TV or read.     Put a cold, moist cloth or cold pack on the painful area for 10 to 20 minutes at a time. Put a thin cloth between the cold pack and your skin.   Use a warm, moist towel or a heating pad set on low to relax tight shoulder and neck muscles.   Have someone gently massage your neck and shoulders.   Take your medicines exactly as prescribed. Call your doctor if you think you are having a problem with your medicine. You will get more details on the specific medicines your doctor prescribes.   Be careful not to take pain medicine more often than the instructions allow. You could get worse or more frequent headaches when the medicine wears off.  To prevent migraines   Keep a headache diary so you can figure out what triggers your headaches. Avoiding triggers may help you prevent headaches. Record when each headache began, how long it lasted, and what the pain was like. (Was it throbbing, aching, stabbing, or dull?) Write down any other symptoms you had with the headache, such as nausea, flashing lights or dark spots, or sensitivity to bright light or loud noise. Note if the headache occurred near your period. List anything that might have triggered the headache. Triggers may include certain foods (chocolate, cheese, wine) or odors, smoke, bright light, stress, or lack of sleep.   If your  doctor has prescribed medicine for your migraines, take it as directed. You may have medicine that you take only when you get a migraine and medicine that you take all the time to help prevent migraines.   If your doctor has prescribed medicine for when you get a headache, take it at the first sign of a migraine, unless your doctor has given you other instructions.   If your doctor has prescribed medicine to prevent migraines, take it exactly as prescribed. Call your doctor if you think you are having a problem with your medicine.   Find healthy ways to deal with stress. Migraines are most common during or right after stressful times. Take time to relax before and after you do something that has caused a migraine in the past.   Try to keep your muscles relaxed by keeping good posture. Check your jaw, face, neck, and shoulder muscles for tension. Try to relax them. When you sit at a desk, change positions often. And make sure to stretch for 30 seconds each hour.   Get plenty of sleep and exercise.   Eat meals on a regular schedule. Avoid foods and drinks that often trigger migraines. These include chocolate, alcohol (especially red wine and port), aspartame, monosodium glutamate (MSG), and some additives found in foods (such as hot dogs, bacon, cold cuts, aged cheeses, and pickled foods).   Limit caffeine. Don't drink too much coffee, tea, or soda. But don't quit caffeine suddenly. That can also give you migraines.   Do not smoke or allow others to smoke around you. If you need help quitting, talk to your doctor about stop-smoking programs and medicines. These can increase your chances of quitting for good.   If you are taking birth control pills or hormone therapy, talk to your doctor about whether they are triggering your migraines.  When should you call for help?  Call 911 anytime you think you may need emergency care. For example, call if:   You have signs of a stroke. These may include:   Sudden  numbness, paralysis, or weakness in your face, arm, or leg, especially   on only one side of your body.   Sudden vision changes.   Sudden trouble speaking.   Sudden confusion or trouble understanding simple statements.   Sudden problems with walking or balance.   A sudden, severe headache that is different from past headaches.  Call your doctor now or seek immediate medical care if:   You have new or worse nausea and vomiting.   You have a new or higher fever.   Your headache gets much worse.  Watch closely for changes in your health, and be sure to contact your doctor if:   You are not getting better after 2 days (48 hours).   Where can you learn more?   Go to [email or web address] and sign in to your MyChart account. Enter U690 in the Search Health Information box to learn more about "Migraine Headache: Care Instructions."    If you do not have an account, please click on the "Sign Up Now" link.      2006-2015 [employer]. Care instructions adapted under license by [facility]. This care instruction is for use with your licensed healthcare professional. If you have questions about a medical condition or this instruction, always ask your healthcare professional. [employer], [employer] disclaims any warranty or liability for your use of this information.  Content Version: 10.6.465758; Current as of: December 17, 2013

## 2014-09-20 NOTE — ED Provider Notes (Addendum)
[facility]  eMERGENCY dEPARTMENT eNCOUnter      Pt Name: Brandy Brandy [name] Kalman  MRN: [id number]  Birthdate July 24, 1946  Date of evaluation: 09/20/2014  Provider: Bufford SpikesErich R Katalyna Socarras, MD    CHIEF COMPLAINT       Chief Complaint   Patient presents with   ??? Headache         HISTORY OF PRESENT ILLNESS  (Location/Symptom, Timing/Onset, Context/Setting, Quality, Duration, Modifying Factors, Severity.)   Brandy Brandy [name] Pertuit is Brandy [name] 68 y.o. female who presents to the emergency department presents with headache.  She states she's had this headache for the past 2 days to 10 on Brandy [name] scale of 1-10.  States she has Brandy [name] history of migraines in the distant past but has not had Brandy [name] headache of this severity and many many years.  Does have Brandy [name] past medical history of hypertension type II diabetes       Nursing Notes were reviewed.    ALLERGIES     Ciprofloxacin; Demerol hcl; and Tetanus toxoids    CURRENT MEDICATIONS       Previous Medications    ASPIRIN 81 MG TABLET    Take 81 mg by mouth daily.    CEPHALEXIN (KEFLEX) 500 MG CAPSULE    Take 1 capsule by mouth 3 times daily    CITALOPRAM (CELEXA) 40 MG TABLET    TAKE 1 TABLET BY MOUTH EVERY NIGHT AT BEDTIME    DICYCLOMINE (BENTYL) 10 MG CAPSULE    Take 1 capsule by mouth 4 times daily (before meals and nightly)    EZETIMIBE (ZETIA) 10 MG TABLET    Take 1 tablet by mouth daily    FLUZONE HIGH-DOSE 0.5 ML SUSY INJECTION        HYDROCODONE-ACETAMINOPHEN (NORCO) 5-325 MG PER TABLET        LEVOTHYROXINE (SYNTHROID) 200 MCG TABLET    TAKE 1 TABLET BY MOUTH EVERY DAY    LORAZEPAM (ATIVAN) 1 MG TABLET    Take 1 tablet by mouth as needed for Anxiety.    MECLIZINE (ANTIVERT) 25 MG TABLET    Take 25 mg by mouth 3 times daily as needed.    MELOXICAM (MOBIC) 15 MG TABLET    Take 1 tablet by mouth daily.    METFORMIN (GLUCOPHAGE) 500 MG TABLET    Take 500 mg by mouth 2 times daily (with meals)    METFORMIN (GLUCOPHAGE) 500 MG TABLET    TAKE 1 TABLET BY MOUTH EVERY DAY WITH BREAKFAST    PRAVASTATIN  (PRAVACHOL) 40 MG TABLET    Take 1 tablet by mouth every evening.    RANITIDINE (ZANTAC) 150 MG TABLET    TAKE 1 TABLET BY MOUTH TWICE DAILY    REPAGLINIDE (PRANDIN) 1 MG TABLET    Take 1 tablet by mouth 3 times daily (before meals)    SOLIFENACIN (VESICARE) 5 MG TABLET    Take 1 tablet by mouth daily    VALSARTAN (DIOVAN) 40 MG TABLET    Take 1 tablet by mouth daily    VENLAFAXINE (EFFEXOR XR) 150 MG XR CAPSULE    Take 1 capsule by mouth daily       PAST MEDICAL HISTORY         Diagnosis Date   ??? Hay fever    ??? Osteoarthritis    ??? Anxiety    ??? Hyperlipidemia    ??? Headache(784.0)    ??? Hypertension    ??? Depression    ??? DM (diabetes mellitus) (HCC)    ???  Urinary incontinence    ??? Unspecified sleep apnea        SURGICAL HISTORY           Procedure Laterality Date   ??? Carpal tunnel release Right 1983   ??? Knee cartilage surgery Right 1973   ??? Cyst removal Right      x2   ??? Hysterectomy  1991   ??? Hernia repair Left 1990     femoral   ??? Neuroma surgery Left 1990     hernia site   ??? Breast lumpectomy Right    ??? Cardiac catheterization  2005,2010   ??? Knee arthroplasty Left    ??? Total knee arthroplasty Left 2013         FAMILY HISTORY           Problem Relation Age of Onset   ??? Osteoporosis Mother    ??? Arthritis Mother    ??? Stroke Mother      Family Status   Relation Status Death Age   ??? Mother Deceased    ??? Father Deceased         SOCIAL HISTORY      reports that she has never smoked. She has never used smokeless tobacco. She reports that she drinks alcohol. She reports that she does not use illicit drugs.    REVIEW OF SYSTEMS    (2-9 systems for level 4, 10 or more for level 5)     Review of Systems   Constitutional: Positive for activity change and appetite change.   HENT: Positive for congestion. Negative for dental problem.    Eyes: Positive for photophobia and visual disturbance.   Respiratory: Positive for cough. Negative for shortness of breath.    Cardiovascular: Negative for chest pain, palpitations and leg swelling.    Gastrointestinal: Positive for nausea.   Endocrine: Negative for polydipsia and polyphagia.   Genitourinary: Negative for urgency.   Musculoskeletal: Positive for myalgias. Negative for neck pain and neck stiffness.   Skin: Negative.    Neurological: Positive for dizziness, light-headedness and headaches. Negative for tremors and numbness.   Psychiatric/Behavioral: Negative for behavioral problems and agitation.         Except as noted above the remainder of the review of systems was reviewed and negative.     PHYSICAL EXAM    (up to 7 for level 4, 8 or more for level 5)   ED Triage Vitals   BP Temp Temp Source Pulse Resp SpO2 Height Weight   09/20/14 0504 09/20/14 0504 09/20/14 0504 09/20/14 0504 09/20/14 0504 09/20/14 0504 09/20/14 0504 09/20/14 0504   174/83 mmHg 98.4 ??F (36.9 ??C) Oral 102 24 98 % 5\' 6"  (1.676 m) 270 lb 15.1 oz (122.899 kg)       Physical Exam   Constitutional: She is oriented to person, place, and time. She appears well-developed.   HENT:   Head: Atraumatic.   Eyes: Conjunctivae and EOM are normal. Pupils are equal, round, and reactive to light.   Neck: Neck supple.   Cardiovascular: Normal rate, regular rhythm, normal heart sounds and intact distal pulses.    Pulmonary/Chest: Effort normal and breath sounds normal.   Abdominal: Soft. Bowel sounds are normal.   Musculoskeletal: Normal range of motion.   Neurological: She is alert and oriented to person, place, and time. No cranial nerve deficit. Coordination normal.   Skin: Skin is warm and dry.   Psychiatric: She has Brandy [name] normal mood and affect. Her behavior is normal.  Nursing note and vitals reviewed.          DIAGNOSTIC RESULTS     EKG: All EKG's are interpreted by the Emergency Department Physician who either signs or Co-signs this chart in the absence of Brandy [name] cardiologist.        RADIOLOGY:   Non-plain film images such as CT, Ultrasound and MRI are read by the radiologist. Plain radiographic images are visualized and preliminarily interpreted  by the emergency physician with the below findings:    CT brain and sinuses are negative for any acute process  Ct Head Wo Contrast    09/20/2014   EXAM:  CT HEAD WO CONTRAST  CLINICAL INDICATION:  Headache worse headache of her life  TECHNIQUE:  Contiguous axial sections were obtained from the skull base through the vertex for evaluation of the brain without intravenous contrast.    COMPARISON:  No pertinent prior studies have been submitted for comparison.  FINDINGS:  The ventricles and cortical sulci are appropriate for the patient's stated age.  There is no acute intracranial hemorrhage, abnormal extra axial fluid collection, midline shift or other significant mass effect.  The gray-white differentiation  is preserved without CT evidence of acute territorial infarct.  The visualized paranasal sinuses are well aerated.  The visualized orbits and orbital contents are within normal limits. The calvarium is intact and the bone windows demonstrate no acute fracture.      09/20/2014   IMPRESSION:  No acute intracranial hemorrhage or mass effect.   Report electronically signed by Marice Potter MD on 09/20/2014 6:00 AM    Ct Sinus Wo Contrast    09/20/2014   EXAM:  CT SINUS WO CONTRAST  CLINICAL INDICATION:  Headache worse headache of her life  TECHNIQUE:  Multiple axial CT images through the paranasal sinuses were obtained.  Coronal sagittal reconstructions are performed.    COMPARISON:  No pertinent prior studies have been submitted for comparison.  FINDINGS:  There has been normal formation of the frontal, ethmoid, maxillary, and sphenoid sinuses.  The frontal, ethmoid and sphenoid sinuses are well pneumatized without significant mucosal thickening or air-fluid level.  No significant air fluid level or mucosal thickening is seen in either maxillary sinus.  The bony margins are maintained.  The maxillary infundibula and ostia are patent.  There is nasal septal deviation towards left.  The visualized orbits and orbital  contents are unremarkable.  The visualized aspects of the tympanomastoid complexes are also within normal limits.       09/20/2014   IMPRESSION:  No significant opacification, mucosal thickening, air-fluid level, or osseous destruction in the paranasal sinuses.   Report electronically signed by Marice Potter MD on 09/20/2014 6:07 AM    Interpretation per the Radiologist below, if available at the time of this note:    CT SINUS WO CONTRAST    Final Result: IMPRESSION:  No significant opacification, mucosal thickening, air-fluid level, or osseous destruction in the paranasal sinuses.             Report electronically signed by Marice Potter MD on 09/20/2014 6:07 AM   CT HEAD WO CONTRAST    Final Result: IMPRESSION:  No acute intracranial hemorrhage or mass effect.             Report electronically signed by Marice Potter MD on 09/20/2014 6:00 AM         ED BEDSIDE ULTRASOUND:   Performed by ED Physician - none    LABS:  Labs Reviewed  BASIC METABOLIC PANEL - Abnormal; Notable for the following:     Glucose 194 (*)     All other components within normal limits   CBC WITH AUTO DIFFERENTIAL - Abnormal; Notable for the following:     RBC 5.22 (*)     Seg Neutrophils 76 (*)     Lymphocytes 17 (*)     All other components within normal limits       All other labs were within normal range or not returned as of this dictation.    EMERGENCY DEPARTMENT COURSE and DIFFERENTIAL DIAGNOSIS/MDM:   Vitals:    Filed Vitals:    09/20/14 0504 09/20/14 0600   BP: 174/83    Pulse: 102    Temp: 98.4 ??F (36.9 ??C)    TempSrc: Oral    Resp: 24    Height: 5\' 6"  (1.676 m)    Weight: 270 lb 15.1 oz (122.899 kg)    SpO2: 98% 99%     Patient is initially medicated with Toradol and Phenergan IM.  She states that this has afforded her very little relief.  At this point Brandy [name] milligram of Dilaudid and 25 mg of Benadryl was ordered IV and the patient will be observed.    CONSULTS:  None    PROCEDURES:  Not clinically indicated.      FINAL IMPRESSION      1.  Intractable migraine without status migrainosus, unspecified migraine type          DISPOSITION/PLAN   DISPOSITION Decision to Discharge    PATIENT REFERRED TO:   Leanora Ivanoff, DO  [address]  [place] [address]  [phone]            DISCHARGE MEDICATIONS:     New Prescriptions    BUTALBITAL-ACETAMINOPHEN-CAFFEINE (FIORICET) 50-325-40 MG PER TABLET    Take 1 tablet by mouth every 4 hours as needed for Pain           (Please note that portions of this note were completed with Brandy [name] voice recognition program.  Efforts were made to edit the dictations but occasionally words are mis-transcribed.)    Bufford Spikes, MD  Attending Emergency Physician          Bufford Spikes, MD  09/20/14 5621    Bufford Spikes, MD  09/20/14 [phone]

## 2014-09-25 ENCOUNTER — Inpatient Hospital Stay
Admit: 2014-09-25 | Discharge: 2014-09-25 | Disposition: A | Discharge status: Home / Self Care | Attending: Emergency Medicine

## 2014-09-25 DIAGNOSIS — G43819 Other migraine, intractable, without status migrainosus: Secondary | ICD-10-CM

## 2014-09-25 MED ORDER — ORPHENADRINE CITRATE 30 MG/ML IJ SOLN
30 MG/ML | Freq: Once | INTRAMUSCULAR | Status: AC
Start: 2014-09-25 — End: 2014-09-25
  Administered 2014-09-25: 15:00:00 30 mg via INTRAVENOUS

## 2014-09-25 MED ORDER — SODIUM CHLORIDE 0.9 % IV BOLUS
0.9 % | Freq: Once | INTRAVENOUS | Status: AC
Start: 2014-09-25 — End: 2014-09-25
  Administered 2014-09-25: 15:00:00 1000 mL via INTRAVENOUS

## 2014-09-25 MED ORDER — KETOROLAC TROMETHAMINE 30 MG/ML IJ SOLN
30 MG/ML | Freq: Once | INTRAMUSCULAR | Status: AC
Start: 2014-09-25 — End: 2014-09-25
  Administered 2014-09-25: 16:00:00 30 mg via INTRAVENOUS

## 2014-09-25 MED ORDER — PROMETHAZINE HCL 25 MG/ML IJ SOLN
25 MG/ML | Freq: Once | INTRAMUSCULAR | Status: AC
Start: 2014-09-25 — End: 2014-09-25
  Administered 2014-09-25: 16:00:00 12.5 mg via INTRAVENOUS

## 2014-09-25 MED ORDER — DIPHENHYDRAMINE HCL 50 MG/ML IJ SOLN
50 MG/ML | Freq: Four times a day (QID) | INTRAMUSCULAR | Status: DC | PRN
Start: 2014-09-25 — End: 2014-09-25
  Administered 2014-09-25: 17:00:00 12.5 mg via INTRAVENOUS

## 2014-09-25 MED FILL — PROMETHAZINE HCL 25 MG/ML IJ SOLN: 25 MG/ML | INTRAMUSCULAR | Qty: 1

## 2014-09-25 MED FILL — KETOROLAC TROMETHAMINE 30 MG/ML IJ SOLN: 30 MG/ML | INTRAMUSCULAR | Qty: 1

## 2014-09-25 MED FILL — DIPHENHYDRAMINE HCL 50 MG/ML IJ SOLN: 50 MG/ML | INTRAMUSCULAR | Qty: 1

## 2014-09-25 MED FILL — ORPHENADRINE CITRATE 30 MG/ML IJ SOLN: 30 MG/ML | INTRAMUSCULAR | Qty: 2

## 2014-09-25 NOTE — ED Notes (Signed)
Feeling better awaiting dispo family remains at bedside.    Aliene AltesJacqueline A Bekki Tavenner, RN  09/25/14 1311

## 2014-09-25 NOTE — ED Notes (Signed)
Awaiting dispo resting in darkened room family remains at bedside.    Aliene AltesJacqueline A Jeanclaude Wentworth, RN  09/25/14 1121

## 2014-09-25 NOTE — ED Notes (Signed)
Generalized HA past week sen here 11/24 for same husband states "prescription makes her loopy" resting in darkened room    Brandy AltesJacqueline A Illeana Edick, RN  09/25/14 1020

## 2014-09-25 NOTE — ED Provider Notes (Signed)
The patient was seen and examined by me in conjunction with the mid-level provider.  I agree with his/her assessment and treatment plan.    The patient has been treated and feels much improved and is able to be discharged home.  Neck supple and no fever.    Cherie OuchJeffery Karianne Nogueira, MD  09/25/14 1321

## 2014-09-25 NOTE — Discharge Instructions (Signed)
Migraine Headache: Care Instructions  Your Care Instructions  Migraines are painful, throbbing headaches that often start on one side of the head. They may cause nausea and vomiting and make you sensitive to light, sound, or smell.  Without treatment, migraines can last from 4 hours to a few days. Medicines can help prevent migraines or stop them after they have started. Your doctor can help you find which ones work best for you.  Follow-up care is a key part of your treatment and safety. Be sure to make and go to all appointments, and call your doctor if you are having problems. It's also a good idea to know your test results and keep a list of the medicines you take.  How can you care for yourself at home?   Do not drive if you have taken a prescription pain medicine.   Rest in a quiet, dark room until your headache is gone. Close your eyes, and try to relax or go to sleep. Don't watch TV or read.   Put a cold, moist cloth or cold pack on the painful area for 10 to 20 minutes at a time. Put a thin cloth between the cold pack and your skin.   Use a warm, moist towel or a heating pad set on low to relax tight shoulder and neck muscles.   Have someone gently massage your neck and shoulders.   Take your medicines exactly as prescribed. Call your doctor if you think you are having a problem with your medicine. You will get more details on the specific medicines your doctor prescribes.   Be careful not to take pain medicine more often than the instructions allow. You could get worse or more frequent headaches when the medicine wears off.  To prevent migraines   Keep a headache diary so you can figure out what triggers your headaches. Avoiding triggers may help you prevent headaches. Record when each headache began, how long it lasted, and what the pain was like. (Was it throbbing, aching, stabbing, or dull?) Write down any other symptoms you had with the headache, such as nausea, flashing lights or dark spots,  or sensitivity to bright light or loud noise. Note if the headache occurred near your period. List anything that might have triggered the headache. Triggers may include certain foods (chocolate, cheese, wine) or odors, smoke, bright light, stress, or lack of sleep.   If your doctor has prescribed medicine for your migraines, take it as directed. You may have medicine that you take only when you get a migraine and medicine that you take all the time to help prevent migraines.   If your doctor has prescribed medicine for when you get a headache, take it at the first sign of a migraine, unless your doctor has given you other instructions.   If your doctor has prescribed medicine to prevent migraines, take it exactly as prescribed. Call your doctor if you think you are having a problem with your medicine.   Find healthy ways to deal with stress. Migraines are most common during or right after stressful times. Take time to relax before and after you do something that has caused a migraine in the past.   Try to keep your muscles relaxed by keeping good posture. Check your jaw, face, neck, and shoulder muscles for tension. Try to relax them. When you sit at a desk, change positions often. And make sure to stretch for 30 seconds each hour.   Get plenty of sleep and   exercise.   Eat meals on a regular schedule. Avoid foods and drinks that often trigger migraines. These include chocolate, alcohol (especially red wine and port), aspartame, monosodium glutamate (MSG), and some additives found in foods (such as hot dogs, bacon, cold cuts, aged cheeses, and pickled foods).   Limit caffeine. Don't drink too much coffee, tea, or soda. But don't quit caffeine suddenly. That can also give you migraines.   Do not smoke or allow others to smoke around you. If you need help quitting, talk to your doctor about stop-smoking programs and medicines. These can increase your chances of quitting for good.   If you are taking birth  control pills or hormone therapy, talk to your doctor about whether they are triggering your migraines.  When should you call for help?  Call 911 anytime you think you may need emergency care. For example, call if:   You have signs of a stroke. These may include:   Sudden numbness, paralysis, or weakness in your face, arm, or leg, especially on only one side of your body.   Sudden vision changes.   Sudden trouble speaking.   Sudden confusion or trouble understanding simple statements.   Sudden problems with walking or balance.   A sudden, severe headache that is different from past headaches.  Call your doctor now or seek immediate medical care if:   You have new or worse nausea and vomiting.   You have a new or higher fever.   Your headache gets much worse.  Watch closely for changes in your health, and be sure to contact your doctor if:   You are not getting better after 2 days (48 hours).   Where can you learn more?   Go to [email or web address] and sign in to your MyChart account. Enter U690 in the Search Health Information box to learn more about "Migraine Headache: Care Instructions."    If you do not have an account, please click on the "Sign Up Now" link.      2006-2015 [employer]. Care instructions adapted under license by [facility]. This care instruction is for use with your licensed healthcare professional. If you have questions about a medical condition or this instruction, always ask your healthcare professional. [employer] disclaims any warranty or liability for your use of this information.  Content Version: 10.6.465758; Current as of: December 17, 2013

## 2014-09-26 NOTE — Telephone Encounter (Signed)
Please offer Brandy [name] an appt this week, she had called me this weekend, with some concerns

## 2014-09-26 NOTE — Telephone Encounter (Signed)
Patient called office today and made appointment this week.

## 2014-09-28 NOTE — ED Provider Notes (Signed)
[facility]  eMERGENCY dEPARTMENT eNCOUnter      Pt Name: Brandy [name]  MRN: [id number]  Birthdate January 29, 1946  Date of evaluation: 09/25/2014  Provider: Luciana Cammarata Gerarda FractionMARIE Brydon Spahr NP, CNP    CHIEF COMPLAINT       Chief Complaint   Patient presents with   ??? Headache         HISTORY OF PRESENT ILLNESS  (Location/Symptom, Timing/Onset, Context/Setting, Quality, Duration, Modifying Factors, Severity.)   Brandy [name] is a 68 y.o. female who presents to the emergency department today by private automobile with complaints of a headache. The patient states that she has had a generalized headache for 10 days.  She was seen here on 11/24 for the same thing and was placed on a prescription that made her loopy. She states she has had a couple episodes of vomiting. She denies fever or chills.       Nursing Notes were reviewed.    ALLERGIES     Ciprofloxacin; Demerol hcl; and Tetanus toxoids    CURRENT MEDICATIONS       Discharge Medication List as of 09/25/2014  1:17 PM      CONTINUE these medications which have NOT CHANGED    Details   butalbital-acetaminophen-caffeine (FIORICET) 50-325-40 MG per tablet Take 1 tablet by mouth every 4 hours as needed for Pain, Disp-180 tablet, R-0      ranitidine (ZANTAC) 150 MG tablet TAKE 1 TABLET BY MOUTH TWICE DAILY, Disp-180 tablet, R-0      levothyroxine (SYNTHROID) 200 MCG tablet TAKE 1 TABLET BY MOUTH EVERY DAY, Disp-90 tablet, R-0      solifenacin (VESICARE) 5 MG tablet Take 1 tablet by mouth daily, Disp-90 tablet, R-1      !! metFORMIN (GLUCOPHAGE) 500 MG tablet Take 500 mg by mouth 2 times daily (with meals)      ezetimibe (ZETIA) 10 MG tablet Take 1 tablet by mouth daily, Disp-30 tablet, R-3      repaglinide (PRANDIN) 1 MG tablet Take 1 tablet by mouth 3 times daily (before meals), Disp-270 tablet, R-3      valsartan (DIOVAN) 40 MG tablet Take 1 tablet by mouth daily, Disp-30 tablet, R-3      HYDROcodone-acetaminophen (NORCO) 5-325 MG per tablet , R-0      dicyclomine  (BENTYL) 10 MG capsule Take 1 capsule by mouth 4 times daily (before meals and nightly), Disp-120 capsule, R-3      venlafaxine (EFFEXOR XR) 150 MG XR capsule Take 1 capsule by mouth daily, Disp-90 capsule, R-6      citalopram (CELEXA) 40 MG tablet TAKE 1 TABLET BY MOUTH EVERY NIGHT AT BEDTIME, Disp-90 tablet, R-0      meloxicam (MOBIC) 15 MG tablet Take 1 tablet by mouth daily., Disp-90 tablet, R-3      meclizine (ANTIVERT) 25 MG tablet Take 25 mg by mouth 3 times daily as needed.      pravastatin (PRAVACHOL) 40 MG tablet Take 1 tablet by mouth every evening., Disp-90 tablet, R-3      LORazepam (ATIVAN) 1 MG tablet Take 1 tablet by mouth as needed for Anxiety., Disp-30 tablet, R-1      aspirin 81 MG tablet Take 81 mg by mouth daily.      !! metFORMIN (GLUCOPHAGE) 500 MG tablet TAKE 1 TABLET BY MOUTH EVERY DAY WITH BREAKFAST, Disp-90 tablet, R-0      FLUZONE HIGH-DOSE 0.5 ML SUSY injection R-0, DAW      cephALEXin (KEFLEX) 500 MG capsule  Take 1 capsule by mouth 3 times daily, Disp-21 capsule, R-0       !! - Potential duplicate medications found. Please discuss with provider.          PAST MEDICAL HISTORY         Diagnosis Date   ??? Hay fever    ??? Osteoarthritis    ??? Anxiety    ??? Hyperlipidemia    ??? Headache(784.0)    ??? Hypertension    ??? Depression    ??? DM (diabetes mellitus) (HCC)    ??? Urinary incontinence    ??? Unspecified sleep apnea        SURGICAL HISTORY           Procedure Laterality Date   ??? Carpal tunnel release Right 1983   ??? Knee cartilage surgery Right 1973   ??? Cyst removal Right      x2   ??? Hysterectomy  1991   ??? Hernia repair Left 1990     femoral   ??? Neuroma surgery Left 1990     hernia site   ??? Breast lumpectomy Right    ??? Cardiac catheterization  2005,2010   ??? Knee arthroplasty Left    ??? Total knee arthroplasty Left 2013         FAMILY HISTORY           Problem Relation Age of Onset   ??? Osteoporosis Mother    ??? Arthritis Mother    ??? Stroke Mother      Family Status   Relation Status Death Age   ???  Mother Deceased    ??? Father Deceased         SOCIAL HISTORY      reports that she has never smoked. She has never used smokeless tobacco. She reports that she drinks alcohol. She reports that she does not use illicit drugs.    REVIEW OF SYSTEMS    (2-9 systems for level 4, 10 or more for level 5)     Review of Systems   Constitutional: Negative for fever, chills and unexpected weight change.   HENT: Negative for congestion, rhinorrhea, sinus pressure and sore throat.    Respiratory: Negative for cough, shortness of breath and wheezing.    Cardiovascular: Negative for chest pain and palpitations.   Gastrointestinal: Negative for nausea, vomiting, abdominal pain, diarrhea and constipation.   Genitourinary: Negative for dysuria and hematuria.   Musculoskeletal: Negative for myalgias and arthralgias.   Skin: Negative for color change and rash.   Neurological: Positive for headaches. Negative for dizziness and weakness.   Hematological: Negative for adenopathy.        Except as noted above the remainder of the review of systems was reviewed and negative.     PHYSICAL EXAM    (up to 7 for level 4, 8 or more for level 5)   ED Triage Vitals   BP Temp Temp Source Pulse Resp SpO2 Height Weight   09/25/14 1015 09/25/14 1015 09/25/14 1015 09/25/14 1015 09/25/14 1015 09/25/14 1015 -- 09/25/14 1015   154/63 mmHg 98.4 ??F (36.9 ??C) Oral 83 18 98 %  272 lb 0.8 oz (123.4 kg)       Physical Exam   Constitutional: She is oriented to person, place, and time. She appears well-developed and well-nourished.   HENT:   Head: Normocephalic and atraumatic.   Mouth/Throat: Oropharynx is clear and moist.   Eyes: Conjunctivae are normal. Pupils are equal, round, and reactive to  light.   Neck: Normal range of motion. Neck supple.   Cardiovascular: Normal rate and regular rhythm.    Pulmonary/Chest: Effort normal and breath sounds normal. No stridor. No respiratory distress.   Abdominal: Soft. Bowel sounds are normal.   Musculoskeletal: Normal  range of motion.   Lymphadenopathy:     She has no cervical adenopathy.   Neurological: She is alert and oriented to person, place, and time.   Skin: Skin is warm and dry. No rash noted.   Psychiatric: She has a normal mood and affect.   Vitals reviewed.          LABS:  Labs Reviewed - No data to display    All other labs were within normal range or not returned as of this dictation.    EMERGENCY DEPARTMENT COURSE and DIFFERENTIAL DIAGNOSIS/MDM:   Vitals:    Filed Vitals:    09/25/14 1015   BP: 154/63   Pulse: 83   Temp: 98.4 ??F (36.9 ??C)   TempSrc: Oral   Resp: 18   Weight: 272 lb 0.8 oz (123.4 kg)   SpO2: 98%       Medical Decision Making:   Medications   0.9 % sodium chloride bolus (0 mLs Intravenous Stopped 09/25/14 1120)   ketorolac (TORADOL) injection 30 mg (30 mg Intravenous Given 09/25/14 1030)   orphenadrine (NORFLEX) injection 30 mg (30 mg Intravenous Given 09/25/14 1029)   promethazine (PHENERGAN) injection 12.5 mg (12.5 mg Intravenous Given 09/25/14 1030)       FINAL IMPRESSION      1. Other migraine without status migrainosus, intractable          DISPOSITION/PLAN   DISPOSITION Decision to Discharge    PATIENT REFERRED TO:   Leanora Ivanoff, DO  [address], [address]  [place] [address]  [phone]    Call in 2 days  RETURN TO ed if symptoms worsen      DISCHARGE MEDICATIONS:     Discharge Medication List as of 09/25/2014  1:17 PM              (Please note that portions of this note were completed with a voice recognition program.  Efforts were made to edit the dictations but occasionally words are mis-transcribed.)    Lula Michaux Gerarda Fraction NP, CNP  Certified Nurse Practitioner          [place]  09/28/14 2045

## 2014-09-30 ENCOUNTER — Ambulatory Visit: Admit: 2014-09-30 | Discharge: 2014-09-30 | Payer: MEDICARE | Attending: Nurse Practitioner | Primary: Family Medicine

## 2014-09-30 ENCOUNTER — Inpatient Hospital Stay: Attending: Nurse Practitioner | Primary: Family Medicine

## 2014-09-30 ENCOUNTER — Inpatient Hospital Stay: Admit: 2014-09-30 | Discharge: 2014-09-30 | Payer: MEDICARE | Attending: Nurse Practitioner | Primary: Family Medicine

## 2014-09-30 ENCOUNTER — Encounter: Attending: Nurse Practitioner | Primary: Family Medicine

## 2014-09-30 DIAGNOSIS — G43011 Migraine without aura, intractable, with status migrainosus: Secondary | ICD-10-CM

## 2014-09-30 MED ORDER — GADOPENTETATE DIMEGLUMINE 469.01 MG/ML IV SOLN
469.01 MG/ML | Freq: Once | INTRAVENOUS | Status: AC | PRN
Start: 2014-09-30 — End: 2014-09-30
  Administered 2014-09-30: 19:00:00 20 mL via INTRAVENOUS

## 2014-09-30 MED ORDER — PROMETHAZINE HCL 25 MG PO TABS
25 MG | ORAL_TABLET | Freq: Four times a day (QID) | ORAL | Status: AC | PRN
Start: 2014-09-30 — End: 2014-10-15

## 2014-09-30 MED ORDER — SUMATRIPTAN SUCCINATE REFILL 6 MG/0.5ML SC SOCT
6 MG/0.5ML | INJECTION | Freq: Every day | SUBCUTANEOUS | Status: DC | PRN
Start: 2014-09-30 — End: 2014-10-12

## 2014-09-30 NOTE — Progress Notes (Signed)
[facility]  [address]  [address]  [place] [address]  Dept: [phone]  Dept Fax: [phone]    Brandy [name] is a 68 y.o. female who presents today for her medical conditions/complaints as noted below.  Brandy [name] is c/o of   Chief Complaint   Patient presents with   ??? Follow-Up from Hospital     ER x 2 migraines [facility],was giving butalbital/acet and causing memory loss. did labs and ct scan   ??? Toe Injury     sore and swollen from a fall in hall way @ home 09/26/14   ??? Headache     discomfort and feels fuzzy, pt syas she just dont feel roght     Have you seen any other physician or provider since your last visit yes - er follow up x 2 migraines and toe injury    Have you had any other diagnostic tests since your last visit? yes - ct-scan and labs    Have you changed or stopped any medications since your last visit including any over-the-counter medicines, vitamins, or herbal medicines? no     Are you taking all your prescribed medications? Yes  If NO, why? -  N/A           Patient Self-Management Goal for this visit.   What is your goal for your visit today? - hosp follow up and toe injury   Barriers to success: none   Plan for overcoming my barriers: N/A      Confidence: 10/10   Date goal set: 09/30/14   Date expected to reach goal: 4days    Health Maintenance Due   Topic Date Due   ??? PNEUMO VAC >= 65 (2 of 2 - PPSV23) 10/29/2013   ??? FALLS RISK  04/28/2014   ??? DEPRESSION SCREENING  06/17/2014   ??? OPHTHALMOLOGY EXAM  09/27/2014         HPI:     HPI Comments: Pt presents for follow up after 2 ER visits for severe headache -- pt has not had a bad headache for years  Pt went to [facility] ER -- did have CT head and labs that were essentially normal  Pt was given toradol and phenergan which did not help pain - -dilaudid and benadryl which helped a little  Returned to ER a few days later for same headache -- given phenergan, toradol and norflex  with mild relief  Pt is still having severe headache -- was given rx for fioricet which made her very groggy and almost hallucinating  Pt is having dizziness, not steady on feet, feels very "fuzzy" -- very flat affect and tone  Pt here with spouse who confirms pt seems like a migraine, but is not acting like herself  Some blurred vision as well     Migraine   This is a recurrent problem. The current episode started in the past 7 days. The problem occurs constantly. The problem has been waxing and waning. The pain is located in the occipital and bilateral region. The pain does not radiate. Similar to prior headaches: yes and no -  The quality of the pain is described as aching, boring, throbbing and squeezing. The pain is at a severity of 10/10. The pain is severe. Associated symptoms include abnormal behavior, blurred vision, dizziness, a loss of balance, nausea, photophobia, scalp tenderness and weakness. Pertinent negatives include no abdominal pain, anorexia, back pain, coughing,  drainage, ear pain, eye pain, eye redness, eye watering, facial sweating, fever, hearing loss, insomnia, muscle aches, neck pain, numbness, phonophobia, rhinorrhea, seizures, sinus pressure, sore throat, swollen glands, tingling, tinnitus, visual change, vomiting or weight loss. The symptoms are aggravated by bright light and emotional stress. She has tried injectable narcotics, ketorolac injections, oxygen, oral narcotics and acetaminophen for the symptoms. The treatment provided no relief. Her past medical history is significant for hypertension, migraine headaches and obesity. There is no history of cancer, cluster headaches, immunosuppression, migraines in the family, pseudotumor cerebri, recent head traumas, sinus disease or TMJ.       HEMOGLOBIN A1C (%)   Date Value   07/21/2014 7.7   02/23/2014 4.2   05/31/2013 6.3*     A1C (%)   Date Value   12/14/2010 7.0*             ( goal A1C is < 7)   MICROALB/CRT. RATIO (mcg/mg creat)    Date Value   05/31/2013 11     LDL CHOLESTEROL (mg/dL)   Date Value   09/60/4540 120   07/15/2012 139*   12/14/2010 129*     LDL CALCULATED (mg/dL)   Date Value   98/08/9146 138       (goal LDL is <100)   AST (U/L)   Date Value   05/31/2013 17     ALT (U/L)   Date Value   05/31/2013 14     BUN (mg/dL)   Date Value   82/95/6213 10     BP Readings from Last 3 Encounters:   09/30/14 170/82   09/25/14 154/63   09/20/14 174/83          (goal 120/80)    Past Medical History   Diagnosis Date   ??? Hay fever    ??? Osteoarthritis    ??? Anxiety    ??? Hyperlipidemia    ??? Headache(784.0)    ??? Hypertension    ??? Depression    ??? DM (diabetes mellitus) (HCC)    ??? Urinary incontinence    ??? Unspecified sleep apnea       Past Surgical History   Procedure Laterality Date   ??? Carpal tunnel release Right 1983   ??? Knee cartilage surgery Right 1973   ??? Cyst removal Right      x2   ??? Hysterectomy  1991   ??? Hernia repair Left 1990     femoral   ??? Neuroma surgery Left 1990     hernia site   ??? Breast lumpectomy Right    ??? Cardiac catheterization  2005,2010   ??? Knee arthroplasty Left    ??? Total knee arthroplasty Left 2013       Family History   Problem Relation Age of Onset   ??? Osteoporosis Mother    ??? Arthritis Mother    ??? Stroke Mother        History   Substance Use Topics   ??? Smoking status: Never Smoker    ??? Smokeless tobacco: Never Used   ??? Alcohol Use: Yes      Current Outpatient Prescriptions   Medication Sig Dispense Refill   ??? SUMAtriptan Succinate Refill 6 MG/0.5ML SOCT Inject 6 mg into the skin daily as needed (may take another dose 1 hour later,  no more than 2 a day) 7 Syringe 5   ??? promethazine (PHENERGAN) 25 MG tablet Take 1 tablet by mouth every 6 hours as needed for Nausea 30 tablet 2   ???  ranitidine (ZANTAC) 150 MG tablet TAKE 1 TABLET BY MOUTH TWICE DAILY 180 tablet 0   ??? levothyroxine (SYNTHROID) 200 MCG tablet TAKE 1 TABLET BY MOUTH EVERY DAY 90 tablet 0   ??? solifenacin (VESICARE) 5 MG tablet Take 1 tablet by mouth daily 90  tablet 1   ??? metFORMIN (GLUCOPHAGE) 500 MG tablet Take 500 mg by mouth 2 times daily (with meals)     ??? ezetimibe (ZETIA) 10 MG tablet Take 1 tablet by mouth daily 30 tablet 3   ??? repaglinide (PRANDIN) 1 MG tablet Take 1 tablet by mouth 3 times daily (before meals) 270 tablet 3   ??? valsartan (DIOVAN) 40 MG tablet Take 1 tablet by mouth daily 30 tablet 3   ??? HYDROcodone-acetaminophen (NORCO) 5-325 MG per tablet   0   ??? dicyclomine (BENTYL) 10 MG capsule Take 1 capsule by mouth 4 times daily (before meals and nightly) 120 capsule 3   ??? meloxicam (MOBIC) 15 MG tablet Take 1 tablet by mouth daily. 90 tablet 3   ??? pravastatin (PRAVACHOL) 40 MG tablet Take 1 tablet by mouth every evening. 90 tablet 3   ??? LORazepam (ATIVAN) 1 MG tablet Take 1 tablet by mouth as needed for Anxiety. 30 tablet 1   ??? aspirin 81 MG tablet Take 81 mg by mouth daily.     ??? cephALEXin (KEFLEX) 500 MG capsule Take 1 capsule by mouth 3 times daily 21 capsule 0     No current facility-administered medications for this visit.     Allergies   Allergen Reactions   ??? Ciprofloxacin Itching     IV cipro only   ??? Demerol Hcl [Meperidine]    ??? Tetanus Toxoids        Health Maintenance   Topic Date Due   ??? PNEUMO VAC >= 65 (2 of 2 - PPSV23) 10/29/2013   ??? FALLS RISK  04/28/2014   ??? DEPRESSION SCREENING  06/17/2014   ??? OPHTHALMOLOGY EXAM  09/27/2014   ??? HEMOGLOBIN A1C  01/19/2015   ??? FOOT EXAM  02/01/2015   ??? MICROALBUMINURIA  02/24/2015   ??? FLU VACCINE YEARLY (ADULT)  05/29/2015   ??? LIPIDS  07/22/2015   ??? BREAST CANCER SCREENING  09/07/2015   ??? DEXA SCAN WOMEN  01/09/2018   ??? TETANUS VACCINE ADULT (11 YEARS AND UP)  04/29/2023   ??? COLON CANCER SCREENING COLONOSCOPY  04/29/2023   ??? ZOSTAVAX VACCINE  Addressed       Subjective:      Review of Systems   Constitutional: Negative for fever, chills, weight loss and fatigue.   HENT: Negative for congestion, ear pain, hearing loss, rhinorrhea, sinus pressure, sore throat and tinnitus.    Eyes: Positive for blurred  vision and photophobia. Negative for pain and redness.   Respiratory: Negative for cough, chest tightness and shortness of breath.    Cardiovascular: Negative for chest pain and leg swelling.   Gastrointestinal: Positive for nausea. Negative for vomiting, abdominal pain, diarrhea and anorexia.   Musculoskeletal: Negative for myalgias, back pain, arthralgias, neck pain and neck stiffness.   Skin: Negative for rash.   Neurological: Positive for dizziness, weakness and loss of balance. Negative for tingling, seizures, light-headedness and numbness.   Psychiatric/Behavioral: Negative for confusion and sleep disturbance. The patient is not nervous/anxious and does not have insomnia.        Objective:     Physical Exam   Constitutional: She is oriented to person, place, and time. She  appears well-developed and well-nourished. No distress.   HENT:   Head: Normocephalic.   Eyes: Pupils are equal, round, and reactive to light. No scleral icterus.   Neck: Normal range of motion. Neck supple. No thyromegaly present.   Cardiovascular: Normal rate, regular rhythm and normal heart sounds.    No murmur heard.  Pulmonary/Chest: Effort normal and breath sounds normal. No respiratory distress. She has no wheezes.   Abdominal: Soft. Bowel sounds are normal. She exhibits no distension. There is no tenderness.   Musculoskeletal: Normal range of motion. She exhibits no edema or tenderness.   Lymphadenopathy:     She has no cervical adenopathy.   Neurological: She is alert and oriented to person, place, and time. She has normal strength. No cranial nerve deficit or sensory deficit. She displays a negative Romberg sign. GCS eye subscore is 4. GCS verbal subscore is 5. GCS motor subscore is 6.   Pupils PERRLA  Occipital scalp tenderness with palpation  No slurred speech, no facial droop, no weakness in any extremity -- all equal and strong    Skin: Skin is warm and dry. No rash noted.   Psychiatric: She has a normal mood and affect. Her  behavior is normal.   Nursing note and vitals reviewed.    BP 170/82 mmHg   Pulse 68   Resp 16   Ht 5\' 6"  (1.676 m)   Wt 268 lb 6.4 oz (121.745 kg)   BMI 43.34 kg/m2    Assessment:      1. Intractable migraine without aura and with status migrainosus  SUMAtriptan Succinate Refill 6 MG/0.5ML SOCT    promethazine (PHENERGAN) 25 MG tablet    MRI Brain W WO Contrast    CANCELED: MRI Brain W Contrast             Plan:      Return if symptoms worsen or fail to improve, for Pain.    Orders Placed This Encounter   Procedures   ??? MRI Brain W WO Contrast     Standing Status: Future      Number of Occurrences: 1      Standing Expiration Date: 10/01/2015     Order Specific Question:  Reason for exam:     Answer:  severe headache, fuzziness, change of gait     Orders Placed This Encounter   Medications   ??? SUMAtriptan Succinate Refill 6 MG/0.5ML SOCT     Sig: Inject 6 mg into the skin daily as needed (may take another dose 1 hour later,  no more than 2 a day)     Dispense:  7 Syringe     Refill:  5   ??? promethazine (PHENERGAN) 25 MG tablet     Sig: Take 1 tablet by mouth every 6 hours as needed for Nausea     Dispense:  30 tablet     Refill:  2       Patient given educational materials - see patient instructions.  Discussed use, benefit, and side effects of prescribed medications.  All patient questions answered. Pt voiced understanding. Reviewed health maintenance.  Instructed to continue current medications, diet and exercise.  Patient agreed with treatment plan. Follow up as directed.     Electronically signed by Christie Beckers, CNP on 09/30/2014 at 5:06 PM      STAT MRI  If mri normal, home with imitrex injectable and promethazine - pt to take both upon arrival home  Rest, fluids, low light low sound  Seek ER care if worsening or acute change in sxs or LOC  Return as needed if not improving or worsening    1522 - Dr Cleotis LemaAbdullah calls office for critical MRI result   MRI shows R parietal lobe has generalized edema   Suggest  admission with MRV/MRA and neurology consult  1525 - discussed with Dr Blood -- agrees on admission and plan  1532 - spoke with pt and her spouse -- agreeable to admission, prefer [facility]  1538 - spoke with [facility] - neuro ICU bed available -- pt to be driven to hospital for  Admission   Dr Carolee Rotarlop - on call provider - updated with pt information

## 2014-10-07 NOTE — Telephone Encounter (Signed)
[facility] Anne on Decadron 4 mg one every 6 hours. She was told that Tresa EndoKelly would wean her off. She has an appointment with Dr.

## 2014-10-07 NOTE — Care Coordination-Inpatient (Signed)
Attempting to reach patient for a f/u from [facility] discharge. Left detailed message to return my call with any questions or concerns. F/U Appointment scheduled already for 10/12/14.

## 2014-10-07 NOTE — Telephone Encounter (Signed)
Has an appointment with Dr. Carolee Rotarlop on December 16th. Should she continue taking the medication as directed until you see her or start to wean off now? Please advise and call patient back at [phone]

## 2014-10-12 ENCOUNTER — Ambulatory Visit: Admit: 2014-10-12 | Discharge: 2014-10-12 | Payer: MEDICARE | Attending: Internal Medicine | Primary: Family Medicine

## 2014-10-12 DIAGNOSIS — I6783 Posterior reversible encephalopathy syndrome: Secondary | ICD-10-CM

## 2014-10-12 MED ORDER — VALSARTAN 80 MG PO TABS
80 MG | ORAL_TABLET | Freq: Every day | ORAL | Status: DC
Start: 2014-10-12 — End: 2015-09-29

## 2014-10-12 NOTE — Telephone Encounter (Signed)
Will give instructions at appointment.

## 2014-10-12 NOTE — Progress Notes (Signed)
[facility] INTERNAL MEDICINE  [address]  [address]  [place] [address]  Dept: [phone]  Dept Fax: [phone]    Brandy [name] is a 68 y.o. female who presents today for her medical conditions/complaints as noted below.  Brandy [name] is c/o of   Chief Complaint   Patient presents with   ??? Follow-Up from Hospital     TTH-posterior encephlopathy reversible syndrome-head still feels a little foggy, but better   ??? Medication Refill     increased Diovan to 80 mg in hospital, if you want her to continue will need refill   ??? Other     was on sliding scale in hosp due to steroids increasing her BS     Have you seen any other physician or provider since your last visit yes - TTH    Have you had any other diagnostic tests since your last visit? yes - TTH    Have you changed or stopped any medications since your last visit including any over-the-counter medicines, vitamins, or herbal medicines? yes - see list     Are you taking all your prescribed medications? Yes  If NO, why? -  N/A           Patient Self-Management Goal for this visit.   What is your goal for your visit today? - TTH-follow up   Barriers to success: none   Plan for overcoming my barriers: N/A      Confidence: 10/10   Date goal set: 10/12/14   Date expected to reach goal: 50month    Medical history Review  Past Medical, Family, and Social History reviewed and does contribute to the patient presenting condition of THN and recent hospitalization for PRES.    Health Maintenance Due   Topic Date Due   ??? PNEUMO VAC >= 65 (2 of 2 - PPSV23) 10/29/2013   ??? OPHTHALMOLOGY EXAM  09/27/2014     +    HPI:     Hypertension  This is a chronic problem. The current episode started more than 1 year ago. The problem is controlled. Associated symptoms include headaches (resolved at this time). Pertinent negatives include no anxiety, blurred vision, chest pain, malaise/fatigue, neck pain, orthopnea, palpitations, peripheral  edema, PND, shortness of breath or sweats. Agents associated with hypertension include thyroid hormones. Risk factors for coronary artery disease include obesity and diabetes mellitus. Past treatments include calcium channel blockers and angiotensin blockers. The current treatment provides significant improvement. There are no compliance problems.  There is no history of angina, kidney disease, CAD/MI, CVA, heart failure, left ventricular hypertrophy, PVD or retinopathy.       HEMOGLOBIN A1C (%)   Date Value   07/21/2014 7.7   02/23/2014 4.2   05/31/2013 6.3*     A1C (%)   Date Value   12/14/2010 7.0*             ( goal A1C is < 7)   MICROALB/CRT. RATIO (mcg/mg creat)   Date Value   05/31/2013 11     LDL CHOLESTEROL (mg/dL)   Date Value   65/78/4696 120   07/15/2012 139*   12/14/2010 129*     LDL CALCULATED (mg/dL)   Date Value   29/52/8413 138       (goal LDL is <100)   AST (U/L)   Date Value   05/31/2013 17     ALT (U/L)   Date Value   05/31/2013  14     BUN (mg/dL)   Date Value   16/10/960411/24/2015 10     BP Readings from Last 3 Encounters:   10/12/14 134/78   09/30/14 170/82   09/25/14 154/63          (goal 120/80)    Past Medical History   Diagnosis Date   ??? Hay fever    ??? Osteoarthritis    ??? Anxiety    ??? Hyperlipidemia    ??? Headache(784.0)    ??? Hypertension    ??? Depression    ??? DM (diabetes mellitus) (HCC)    ??? Urinary incontinence    ??? Unspecified sleep apnea       Past Surgical History   Procedure Laterality Date   ??? Carpal tunnel release Right 1983   ??? Knee cartilage surgery Right 1973   ??? Cyst removal Right      x2   ??? Hysterectomy  1991   ??? Hernia repair Left 1990     femoral   ??? Neuroma surgery Left 1990     hernia site   ??? Breast lumpectomy Right    ??? Cardiac catheterization  2005,2010   ??? Knee arthroplasty Left    ??? Total knee arthroplasty Left 2013       Family History   Problem Relation Age of Onset   ??? Osteoporosis Mother    ??? Arthritis Mother    ??? Stroke Mother        History   Substance Use Topics   ???  Smoking status: Never Smoker    ??? Smokeless tobacco: Never Used   ??? Alcohol Use: Yes      Current Outpatient Prescriptions   Medication Sig Dispense Refill   ??? amLODIPine (NORVASC) 5 MG tablet      ??? dexamethasone (DECADRON) 4 MG tablet      ??? valsartan (DIOVAN) 80 MG tablet Take 1 tablet by mouth daily 90 tablet 3   ??? promethazine (PHENERGAN) 25 MG tablet Take 1 tablet by mouth every 6 hours as needed for Nausea 30 tablet 2   ??? levothyroxine (SYNTHROID) 200 MCG tablet TAKE 1 TABLET BY MOUTH EVERY DAY 90 tablet 0   ??? metFORMIN (GLUCOPHAGE) 500 MG tablet Take 500 mg by mouth 2 times daily (with meals)     ??? ezetimibe (ZETIA) 10 MG tablet Take 1 tablet by mouth daily 30 tablet 3   ??? repaglinide (PRANDIN) 1 MG tablet Take 1 tablet by mouth 3 times daily (before meals) 270 tablet 3   ??? HYDROcodone-acetaminophen (NORCO) 5-325 MG per tablet   0   ??? dicyclomine (BENTYL) 10 MG capsule Take 1 capsule by mouth 4 times daily (before meals and nightly) 120 capsule 3   ??? LORazepam (ATIVAN) 1 MG tablet Take 1 tablet by mouth as needed for Anxiety. 30 tablet 1   ??? aspirin 81 MG tablet Take 81 mg by mouth daily.       No current facility-administered medications for this visit.     Allergies   Allergen Reactions   ??? Ciprofloxacin Itching     IV cipro only   ??? Demerol Hcl [Meperidine]    ??? Tetanus Toxoids        Health Maintenance   Topic Date Due   ??? PNEUMO VAC >= 65 (2 of 2 - PPSV23) 10/29/2013   ??? OPHTHALMOLOGY EXAM  09/27/2014   ??? HEMOGLOBIN A1C  01/19/2015   ??? FOOT EXAM  02/01/2015   ???  MICROALBUMINURIA  02/24/2015   ??? FLU VACCINE YEARLY (ADULT)  05/29/2015   ??? LIPIDS  07/22/2015   ??? BREAST CANCER SCREENING  09/07/2015   ??? DEPRESSION SCREENING  10/13/2015   ??? FALLS RISK  10/13/2015   ??? DEXA SCAN WOMEN  01/09/2018   ??? TETANUS VACCINE ADULT (11 YEARS AND UP)  04/29/2023   ??? COLON CANCER SCREENING COLONOSCOPY  04/29/2023   ??? ZOSTAVAX VACCINE  Addressed       Subjective:      Review of Systems   Constitutional: Negative for  malaise/fatigue.   Eyes: Negative for blurred vision.   Respiratory: Negative for shortness of breath and wheezing.    Cardiovascular: Negative for chest pain, palpitations, orthopnea and PND.   Gastrointestinal: Negative for abdominal pain and constipation.   Musculoskeletal: Negative for neck pain.   Neurological: Positive for headaches (resolved at this time).       Objective:     Physical Exam   Constitutional: She is oriented to person, place, and time. She appears well-developed and well-nourished.   HENT:   Head: Normocephalic and atraumatic.   Eyes: Pupils are equal, round, and reactive to light.   Neck: Neck supple. No JVD present.   Cardiovascular: Normal rate and regular rhythm.    No murmur heard.  Pulmonary/Chest: Effort normal and breath sounds normal.   Abdominal: Soft. Bowel sounds are normal. There is no tenderness.   Musculoskeletal: She exhibits no edema.   Neurological: She is alert and oriented to person, place, and time. No cranial nerve deficit.   Skin: Skin is warm and dry.   Psychiatric: She has a normal mood and affect.   Nursing note and vitals reviewed.    BP 134/78 mmHg   Pulse 68   Resp 16   Ht 5\' 6"  (1.676 m)   Wt 263 lb 3.2 oz (119.387 kg)   BMI 42.50 kg/m2    Assessment:      1. PRES (posterior reversible encephalopathy syndrome)     2. Screening  HM FALL RISK    Fall Risk Assessment Completed   3. Depression screening  Negative Screen for Clinical Depression, Follow-up not Required [id number]   4. Essential hypertension     5. Type 2 diabetes mellitus without complication (HCC)     6. BMI 40.0-44.9, adult (HCC)               Plan:    wean decadron by 1 tab every 3 days over the next 9 days.  If no recurrence of symptoms ok to travel to [place].  Return in about 3 months (around 01/11/2015).    Orders Placed This Encounter   Procedures   ??? HM FALL RISK   ??? Negative Screen for Clinical Depression, Follow-up not Required [phone]     Orders Placed This Encounter   Medications   ???  valsartan (DIOVAN) 80 MG tablet     Sig: Take 1 tablet by mouth daily     Dispense:  90 tablet     Refill:  3       Patient given educational materials - see patient instructions.  Discussed use, benefit, and side effects of prescribed medications.  All patient questions answered. Pt voiced understanding. Reviewed health maintenance.  Instructed to continue current medications, diet and exercise.  Patient agreed with treatment plan. Follow up as directed.     Electronically signed by Leanora IvanoffStanley J Decklyn Hyder, DO on 10/12/2014 at 4:35 PM

## 2014-11-04 ENCOUNTER — Telehealth

## 2014-11-04 NOTE — Telephone Encounter (Signed)
Patient needs CT ordered to have yearly aneurysm checked.  Would like faxed to [facility] and she will schedule her own appt.

## 2014-11-10 NOTE — Telephone Encounter (Signed)
F[facility]ed CT [facility].

## 2014-11-10 NOTE — Telephone Encounter (Signed)
CT ordered.

## 2014-11-16 ENCOUNTER — Inpatient Hospital Stay: Admit: 2014-11-16 | Discharge: 2014-11-25 | Payer: MEDICARE | Attending: Internal Medicine | Primary: Family Medicine

## 2014-11-16 ENCOUNTER — Encounter

## 2014-11-16 DIAGNOSIS — I714 Abdominal aortic aneurysm, without rupture, unspecified: Secondary | ICD-10-CM

## 2014-11-16 MED ORDER — IOVERSOL 74 % IV SOLN
74 % | Freq: Once | INTRAVENOUS | Status: AC | PRN
Start: 2014-11-16 — End: 2014-11-16
  Administered 2014-11-16: 16:00:00 100 mL via INTRAVENOUS

## 2014-11-16 NOTE — Telephone Encounter (Signed)
[facility] to [facility] and she called concerned that last time she had to drink contrast and this time they are telling her she does not, meanwhile Jackie from [facility] called on other line, last order was for CTA and this is test they recommend for aneurysm.  I changed order to CTA with contrast per radiology request.

## 2014-11-16 NOTE — Telephone Encounter (Signed)
Noted change to CTA, awaiting results.

## 2014-11-22 NOTE — Telephone Encounter (Signed)
Please review Ct of abdomen and pelvis from 11-16-14 and advise on results.

## 2014-11-23 NOTE — Telephone Encounter (Signed)
Pt called again wanting to know the results of CT.  Please advise.

## 2014-11-23 NOTE — Telephone Encounter (Signed)
Aneurysm is stable and unchanged.

## 2014-11-24 NOTE — Telephone Encounter (Signed)
Talked to patient, aneurysm is stable and unchanged.

## 2014-12-07 ENCOUNTER — Encounter: Attending: Internal Medicine | Primary: Family Medicine

## 2014-12-14 ENCOUNTER — Encounter: Attending: Internal Medicine | Primary: Family Medicine

## 2014-12-23 MED ORDER — EZETIMIBE 10 MG PO TABS
10 MG | ORAL_TABLET | Freq: Every day | ORAL | Status: DC
Start: 2014-12-23 — End: 2015-01-16

## 2014-12-23 NOTE — Telephone Encounter (Signed)
Pharmacy requests refill of zetia, will e-scribe.

## 2015-01-16 MED ORDER — EZETIMIBE 10 MG PO TABS
10 MG | ORAL_TABLET | Freq: Every day | ORAL | Status: DC
Start: 2015-01-16 — End: 2015-09-06

## 2015-01-17 MED ORDER — LEVOTHYROXINE SODIUM 200 MCG PO TABS
200 MCG | ORAL_TABLET | ORAL | Status: DC
Start: 2015-01-17 — End: 2015-02-28

## 2015-01-17 MED ORDER — METFORMIN HCL 500 MG PO TABS
500 MG | ORAL_TABLET | ORAL | Status: DC
Start: 2015-01-17 — End: 2016-01-20

## 2015-01-19 NOTE — Telephone Encounter (Signed)
From: Greig CastillaPatricia A Barrick  To: Leanora IvanoffStanley J Orlop, DO  Sent: 01/19/2015 10:01 AM EDT  Subject: Non-Urgent Medical Question    I am so sorry I misread the EOB and jumped to conclusions. PJ

## 2015-01-23 NOTE — Telephone Encounter (Signed)
Yes she is ok to use rogaine  Another alternative is nioxin -- seen at local grocery stores and [employer]

## 2015-01-23 NOTE — Telephone Encounter (Signed)
Pt called office stating that she is losing a lot of hair and she would like to start Rogaine and wants to know if that is safe for her along with all the medications she is currently taking    Please advise

## 2015-01-24 NOTE — Telephone Encounter (Signed)
Patient notified.

## 2015-02-20 ENCOUNTER — Ambulatory Visit: Admit: 2015-02-20 | Discharge: 2015-02-20 | Payer: MEDICARE | Attending: Internal Medicine | Primary: Family Medicine

## 2015-02-20 DIAGNOSIS — E0841 Diabetes mellitus due to underlying condition with diabetic mononeuropathy: Secondary | ICD-10-CM

## 2015-02-20 LAB — POCT GLYCOSYLATED HEMOGLOBIN (HGB A1C): Hemoglobin A1C: 7.2 %

## 2015-02-20 MED ORDER — GABAPENTIN 100 MG PO CAPS
100 MG | ORAL_CAPSULE | Freq: Every day | ORAL | Status: DC
Start: 2015-02-20 — End: 2015-04-12

## 2015-02-20 MED ORDER — OXYBUTYNIN CHLORIDE 5 MG PO TABS
5 MG | ORAL_TABLET | Freq: Three times a day (TID) | ORAL | Status: DC
Start: 2015-02-20 — End: 2015-09-06

## 2015-02-20 MED ORDER — PRAVASTATIN SODIUM 20 MG PO TABS
20 MG | ORAL_TABLET | Freq: Every evening | ORAL | Status: DC
Start: 2015-02-20 — End: 2015-09-06

## 2015-02-20 NOTE — Progress Notes (Signed)
[facility] INTERNAL MEDICINE  [address]  [address]  [place] [address]  Dept: [phone]  Dept Fax: [phone]    Brandy [name] is a 69 y.o. female who presents today for her medical conditions/complaints as noted below.  Brandy [name] is c/o of   Chief Complaint   Patient presents with   ??? Leg Injury     right upper posterior thigh burns and "prickles" all the way down leg   ??? Joint Swelling     bilat ankles   ??? Other     can't afford zetia anymore   ??? Hair/Scalp Problem     losing hair     DIABETES and HYPERTENSION visit    BP Readings from Last 3 Encounters:   02/20/15 120/82   10/12/14 134/78   09/30/14 170/82        HEMOGLOBIN A1C (%)   Date Value   02/20/2015 7.2   07/21/2014 7.7   02/23/2014 4.2     A1C (%)   Date Value   12/14/2010 7.0*     MICROALB/CRT. RATIO (mcg/mg creat)   Date Value   05/31/2013 11     LDL CHOLESTEROL (mg/dL)   Date Value   42/59/5638 120     LDL CALCULATED (mg/dL)   Date Value   75/64/3329 138     HDL (mg/dL)   Date Value   51/88/4166 33*     BUN (mg/dL)   Date Value   04/26/1600 10     CREATININE (mg/dL)   Date Value   09/32/3557 0.60   03/22/2011 0.70     GLUCOSE (mg/dL)   Date Value   32/20/2542 194*   12/14/2010 165*            Are you taking your medications for Diabetes? -  Yes   Are you having any side effects from Diabetes medications? - No    Are you taking your medications for hypertension? -   Yes   Are you having any side effects from hypertension medications? -  No    Are you taking any cholesterol medication?  -  Yes   Are you having any side effects from cholesterol medications? -  No    Are you taking an Aspirin daily? -  Yes               Are you taking all your prescribed medications? Yes             If NO, why? -  N/A    Are you taking any over-the-counter medicines, vitamins, or herbal medicines? yes - see med list     Have you changed or stopped any medications since your last visit including any  over-the-counter medicines, vitamins, or herbal medicines? no     Have you seen any other physician or provider since your last visit yes - eye doctor    Have you had any other diagnostic tests since your last visit? no    Have you had your annual diabetic retinal (eye) exam?  yes - 1 1-12016    Diabetic Patient Self-Management Goal(s):     Increase activity-exercise for 30 min daily - 3 to 4 days per week    Improved diet - adhere to recommended diabetic diet/low cholesterol/low sodium diet     Take medications as prescribed and call the office if any troubles with medications develop    will check blood sugars at least one  time daily and blood pressure weekly    schedule an appointment with a diabetes educator    schedule yearly diabetic eye exam    will work on weight reduction program    leg tingling, hair loss  Barriers to success: none  Plan for overcoming my barriers: N/A     Confidence: 10/10  Date goal set: 02/20/15  Date expected to reach goal: 1day               Microalbumin performed if applicable? - No   (due every 6 to 12 months)    Shoes and socks removed? No  Monofilament placed on counter? - No    Tobacco use:  Patient  reports that she has never smoked. She has never used smokeless tobacco.    If Smoker - Cessation materials given? NA       1-800-QUIT-NOW [phone])     Diabetic Health Maintenance   Health Maintenance Due   Topic Date Due   ??? HEPATITIS  C SCREENING  09-15-1946   ??? OBESITY BMI >= 30  03/26/1966   ??? PNEUMO VAC >= 65 (2 of 2 - PPSV23) 10/29/2013   ??? FOOT EXAM  02/01/2015   ??? MICROALBUMINURIA  02/24/2015       Diabetes Management   Topic Date Due   ??? FOOT EXAM  02/01/2015   ??? MICROALBUMINURIA  02/24/2015       Medical history Review  Past Medical, Family, and Social History reviewed and does not contribute to the patient presenting condition      HPI:     HPI Comments: Having tingling prickling sensation down her R thigh, burns at times, can be painful, mainly at  night, but has happened at other times  Has had some swelling to her ankles periodically  Losing some hair    Leg Pain   The incident occurred more than 1 week ago. There was no injury mechanism. The pain is present in the right thigh. The quality of the pain is described as burning (prickling sensation). The pain is at a severity of 4/10. The pain is moderate. The pain has been intermittent since onset. Associated symptoms include numbness. Pertinent negatives include no inability to bear weight. She reports no foreign bodies present. Nothing aggravates the symptoms. She has tried acetaminophen for the symptoms. The treatment provided mild relief.       HEMOGLOBIN A1C (%)   Date Value   02/20/2015 7.2   07/21/2014 7.7   02/23/2014 4.2     A1C (%)   Date Value   12/14/2010 7.0*             ( goal A1C is < 7)   MICROALB/CRT. RATIO (mcg/mg creat)   Date Value   05/31/2013 11     LDL CHOLESTEROL (mg/dL)   Date Value   98/08/9146 120   07/15/2012 139*   12/14/2010 129*     LDL CALCULATED (mg/dL)   Date Value   82/95/6213 138       (goal LDL is <100)   AST (U/L)   Date Value   05/31/2013 17     ALT (U/L)   Date Value   05/31/2013 14     BUN (mg/dL)   Date Value   08/65/7846 10     BP Readings from Last 3 Encounters:   02/20/15 120/82   10/12/14 134/78   09/30/14 170/82          (goal 120/80)  Past Medical History   Diagnosis Date   ??? Hay fever    ??? Osteoarthritis    ??? Anxiety    ??? Hyperlipidemia    ??? Headache(784.0)    ??? Hypertension    ??? Depression    ??? DM (diabetes mellitus) (HCC)    ??? Urinary incontinence    ??? Unspecified sleep apnea       Past Surgical History   Procedure Laterality Date   ??? Carpal tunnel release Right 1983   ??? Knee cartilage surgery Right 1973   ??? Cyst removal Right      x2   ??? Hysterectomy  1991   ??? Hernia repair Left 1990     femoral   ??? Neuroma surgery Left 1990     hernia site   ??? Breast lumpectomy Right    ??? Cardiac catheterization  2005,2010   ??? Knee arthroplasty Left    ??? Total knee  arthroplasty Left 2013       Family History   Problem Relation Age of Onset   ??? Osteoporosis Mother    ??? Arthritis Mother    ??? Stroke Mother        History   Substance Use Topics   ??? Smoking status: Never Smoker    ??? Smokeless tobacco: Never Used   ??? Alcohol Use: Yes      Current Outpatient Prescriptions   Medication Sig Dispense Refill   ??? pravastatin (PRAVACHOL) 20 MG tablet Take 1 tablet by mouth every evening 30 tablet 3   ??? oxybutynin (DITROPAN) 5 MG tablet Take 1 tablet by mouth 3 times daily 90 tablet 3   ??? gabapentin (NEURONTIN) 100 MG capsule Take 1 capsule by mouth daily 30 capsule 3   ??? levothyroxine (SYNTHROID) 200 MCG tablet TAKE 1 TABLET BY MOUTH EVERY DAY 90 tablet 3   ??? metFORMIN (GLUCOPHAGE) 500 MG tablet TAKE 1 TABLET BY MOUTH EVERY DAY WITH BREAKFAST 90 tablet 3   ??? ezetimibe (ZETIA) 10 MG tablet Take 1 tablet by mouth daily 90 tablet 3   ??? amLODIPine (NORVASC) 5 MG tablet      ??? valsartan (DIOVAN) 80 MG tablet Take 1 tablet by mouth daily 90 tablet 3   ??? repaglinide (PRANDIN) 1 MG tablet Take 1 tablet by mouth 3 times daily (before meals) 270 tablet 3   ??? HYDROcodone-acetaminophen (NORCO) 5-325 MG per tablet   0   ??? dicyclomine (BENTYL) 10 MG capsule Take 1 capsule by mouth 4 times daily (before meals and nightly) 120 capsule 3   ??? LORazepam (ATIVAN) 1 MG tablet Take 1 tablet by mouth as needed for Anxiety. 30 tablet 1   ??? aspirin 81 MG tablet Take 81 mg by mouth daily.     ??? metFORMIN (GLUCOPHAGE) 500 MG tablet Take 500 mg by mouth 2 times daily (with meals)       No current facility-administered medications for this visit.     Allergies   Allergen Reactions   ??? Ciprofloxacin Itching     IV cipro only   ??? Demerol Hcl [Meperidine]    ??? Tetanus Toxoids        Health Maintenance   Topic Date Due   ??? HEPATITIS  C SCREENING  09[birth year]   ??? OBESITY BMI >= 30  03/26/1966   ??? PNEUMO VAC >= 65 (2 of 2 - PPSV23) 10/29/2013   ??? FOOT EXAM  02/01/2015   ??? MICROALBUMINURIA  02/24/2015   ??? FLU  VACCINE YEARLY  (ADULT)  05/29/2015   ??? LIPIDS  07/22/2015   ??? HEMOGLOBIN A1C  08/22/2015   ??? OPHTHALMOLOGY EXAM  10/29/2015   ??? BREAST CANCER SCREENING  09/06/2016   ??? DEXA SCAN WOMEN  01/09/2018   ??? TETANUS VACCINE ADULT (11 YEARS AND UP)  04/29/2023   ??? COLON CANCER SCREENING COLONOSCOPY  04/29/2023   ??? ZOSTAVAX VACCINE  Addressed       Subjective:      Review of Systems   Constitutional: Negative for fever and fatigue.   HENT: Negative for rhinorrhea and sore throat.    Eyes: Negative for photophobia and visual disturbance.   Respiratory: Negative for cough and shortness of breath.    Cardiovascular: Negative for chest pain, palpitations and leg swelling.   Gastrointestinal: Negative for diarrhea and constipation.   Endocrine: Negative for polydipsia and polyuria.   Genitourinary: Positive for urgency and frequency. Negative for dysuria.   Musculoskeletal: Positive for arthralgias. Negative for myalgias.   Skin: Negative for rash and wound.   Allergic/Immunologic: Negative for environmental allergies and immunocompromised state.   Neurological: Positive for numbness. Negative for dizziness, weakness, light-headedness and headaches.   Hematological: Negative for adenopathy. Does not bruise/bleed easily.   Psychiatric/Behavioral: Negative for sleep disturbance. The patient is not nervous/anxious.        Objective:     Physical Exam   Constitutional: She is oriented to person, place, and time. She appears well-developed and well-nourished.   + hair loss   HENT:   Head: Normocephalic and atraumatic.   Eyes: Conjunctivae are normal. Pupils are equal, round, and reactive to light.   Neck: Normal range of motion. Neck supple.   Cardiovascular: Normal rate, regular rhythm and normal heart sounds.    No murmur heard.  Pulmonary/Chest: Effort normal and breath sounds normal.   Abdominal: Soft. Bowel sounds are normal. There is no hepatosplenomegaly. There is no tenderness. There is no guarding, no CVA tenderness and negative Murphy's  sign.   Musculoskeletal: Normal range of motion. She exhibits no edema.   Neurological: She is alert and oriented to person, place, and time. No cranial nerve deficit.   Skin: Skin is warm and dry.   Psychiatric: She has a normal mood and affect. Her behavior is normal.   Nursing note and vitals reviewed.    BP 120/82 mmHg   Pulse 80   Resp 18   Wt 268 lb (121.564 kg)    Assessment:      1. 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 Type 2 diabetes mellitus without complication (HCC)  POCT glycosylated hemoglobin (Hb A1C)    Comprehensive Metabolic Panel   3. Hair loss  TSH without Reflex    CBC    Vitamin D 1,25 Dihydroxy    Vitamin B12   4. Other fatigue  TSH without Reflex    CBC    Vitamin D 1,25 Dihydroxy    Vitamin B12   5. Other specified hypothyroidism  TSH without Reflex             Plan:      Return in about 3 months (around 05/22/2015) for htn.    Orders Placed This Encounter   Procedures   ??? TSH without Reflex     Standing Status: Future      Number of Occurrences:       Standing Expiration Date: 02/20/2016   ??? Comprehensive Metabolic Panel  Standing Status: Future      Number of Occurrences:       Standing Expiration Date: 02/20/2016   ??? CBC     Standing Status: Future      Number of Occurrences:       Standing Expiration Date: 02/20/2016   ??? Vitamin D 1,25 Dihydroxy     Standing Status: Future      Number of Occurrences:       Standing Expiration Date: 02/20/2016   ??? Vitamin B12     Standing Status: Future      Number of Occurrences:       Standing Expiration Date: 02/20/2016   ??? POCT glycosylated hemoglobin (Hb A1C)     Orders Placed This Encounter   Medications   ??? pravastatin (PRAVACHOL) 20 MG tablet     Sig: Take 1 tablet by mouth every evening     Dispense:  30 tablet     Refill:  3   ??? oxybutynin (DITROPAN) 5 MG tablet     Sig: Take 1 tablet by mouth 3 times daily     Dispense:  90 tablet     Refill:  3   ??? gabapentin (NEURONTIN) 100 MG capsule      Sig: Take 1 capsule by mouth daily     Dispense:  30 capsule     Refill:  3      Will try to start neurontin 100 mg daily  Change vesicare to ditropan  Will change zetia to prvachol d/t insurance  Will check labs for hair loss  Enc to stay active, healthy diet, exercise program  F/u 3-4 mos     Patient given educational materials - see patient instructions.  Discussed use, benefit, and side effects of prescribed medications.  All patient questions answered. Pt voiced understanding. Reviewed health maintenance.  Instructed to continue current medications, diet and exercise.  Patient agreed with treatment plan. Follow up as directed.     Electronically signed by Laurena Slimmerheryl L Ashtin Rosner, NP on 02/20/2015 at 10:06 PM

## 2015-02-20 NOTE — Telephone Encounter (Signed)
Patient was seen today and refilled where sent.

## 2015-02-24 LAB — CBC
Hematocrit: 42.4 % (ref 36–46)
Hemoglobin: 14.1 g/dL (ref 12.0–16.0)
MCH: 27.2 pg
MCHC: 33.2 g/dL
MCV: 82 fL
MPV: 9.2 fL
Platelets: 283 K/??L
WBC: 7.9 10^3/mL

## 2015-02-24 LAB — COMPREHENSIVE METABOLIC PANEL
ALT: 19 U/L
AST: 19 U/L
Albumin: 3.5
Alkaline Phosphatase: 100 U/L
BUN: 10 mg/dL
CO2: 30 mmol/L
Calcium: 9.3 mg/dL
Chloride: 97 mmol/L
Creatinine: 0.75
Gfr Calculated: >60
Glucose: 158 mg/dL
Potassium: 4.2 mmol/L
Sodium: 137 mmol/L
Total Bilirubin: 0.5 mg/dL (ref 0.1–1.4)
Total Protein: 6.8

## 2015-02-24 LAB — VITAMIN B12: Vitamin B-12: 403

## 2015-02-24 LAB — VITAMIN B12 & FOLATE: Vitamin B-12: 403

## 2015-02-24 LAB — TSH: TSH: 0.22 u[IU]/mL

## 2015-02-28 ENCOUNTER — Encounter

## 2015-02-28 ENCOUNTER — Telehealth

## 2015-02-28 MED ORDER — LEVOTHYROXINE SODIUM 150 MCG PO TABS
150 MCG | ORAL_TABLET | Freq: Every day | ORAL | Status: DC
Start: 2015-02-28 — End: 2015-06-29

## 2015-02-28 MED ORDER — CHOLECALCIFEROL 25 MCG (1000 UT) PO TABS
25 MCG (1000 UT) | ORAL_TABLET | Freq: Every day | ORAL | Status: DC
Start: 2015-02-28 — End: 2019-11-22

## 2015-02-28 NOTE — Telephone Encounter (Signed)
Vit d def, will start daily supplement  Low tsh, will adjust levothyroxine to 150 mcg daily  Recheck tsh and vit d in 2 mos

## 2015-03-01 NOTE — Telephone Encounter (Signed)
LMOVM for call back, please advise results.

## 2015-03-02 NOTE — Telephone Encounter (Signed)
LMOVM notified below.

## 2015-03-02 NOTE — Telephone Encounter (Signed)
Yes it can

## 2015-03-02 NOTE — Telephone Encounter (Signed)
Pt aware of lab results as stated below by provider, lab orders mailed to pt to recheck in 2 months.     Pt would like to know if these lab results could be the reason that her hair has been falling out  Please advise

## 2015-03-02 NOTE — Telephone Encounter (Signed)
Pt returned call and is aware that these test results could be the reason for hair loss

## 2015-03-28 ENCOUNTER — Ambulatory Visit: Admit: 2015-03-28 | Discharge: 2015-03-28 | Payer: MEDICARE | Attending: Internal Medicine | Primary: Family Medicine

## 2015-03-28 ENCOUNTER — Encounter: Attending: Internal Medicine | Primary: Family Medicine

## 2015-03-28 ENCOUNTER — Encounter

## 2015-03-28 DIAGNOSIS — R609 Edema, unspecified: Secondary | ICD-10-CM

## 2015-03-28 MED ORDER — AMOXICILLIN 500 MG PO CAPS
500 MG | ORAL_CAPSULE | Freq: Once | ORAL | Status: AC
Start: 2015-03-28 — End: 2015-03-28

## 2015-03-28 NOTE — Progress Notes (Signed)
[facility]  [address]  [address]  [place] [address]  Dept: [phone]  Dept Fax: [phone]    Brandy [name] is a 69 y.o. female who presents today for her medical conditions/complaints as noted below.  Brandy [name] is c/o of   Chief Complaint   Patient presents with   ??? Foot Swelling     bilateral, has been swollen for 5 days   ??? Cough   ??? Other     tightness in chest, heavy feeling   ??? Other     need ATB for dental work   ??? Fatigue     Have you changed or stopped any medications since your last visit including any over-the-counter medicines, vitamins, or herbal medicines? no     Are you taking all your prescribed medications? Yes          If NO, why? -  N/A    Have you seen any other physician or provider since your last visit yes - Dr. Seth Bake    Have you had any other diagnostic tests since your last visit? no    Tobacco use:  Patient  reports that she has never smoked. She has never used smokeless tobacco.   If a smoker, cessation materials provided? No   1-800-QUIT-NOW [phone])     Medical history Review  Past Medical, Family, and Social History reviewed and does not contribute to the patient presenting condition of lower ext swelling.    Health Maintenance Due   Topic Date Due   ??? HEPATITIS  C SCREENING  1946/05/18   ??? OBESITY BMI >= 30  03/26/1966   ??? PNEUMO VAC >= 65 (2 of 2 - PPSV23) 10/29/2013   ??? FOOT EXAM  02/01/2015   ??? MICROALBUMINURIA  02/24/2015           HPI:     HPI Comments: Swelling improves with elevation.    Other  This is a new (presents for lower extremity swelling for last 7-10 days.) problem. The current episode started 1 to 4 weeks ago. The problem occurs constantly. The problem has been waxing and waning. Associated symptoms include arthralgias, coughing and fatigue. Pertinent negatives include no abdominal pain, anorexia, change in bowel habit, chest pain, chills, congestion, diaphoresis, fever,  headaches, joint swelling, myalgias, nausea, neck pain, numbness, rash, sore throat, swollen glands, urinary symptoms, vertigo, visual change, vomiting or weakness. The symptoms are aggravated by exertion. She has tried nothing for the symptoms. The treatment provided no relief.       HEMOGLOBIN A1C (%)   Date Value   02/20/2015 7.2   07/21/2014 7.7   02/23/2014 4.2     A1C (%)   Date Value   12/14/2010 7.0*             ( goal A1C is < 7)   MICROALB/CRT. RATIO (mcg/mg creat)   Date Value   05/31/2013 11     LDL CHOLESTEROL (mg/dL)   Date Value   28/41/3244 120   07/15/2012 139*   12/14/2010 129*     LDL CALCULATED (mg/dL)   Date Value   10/30/7251 138       (goal LDL is <100)   AST (U/L)   Date Value   02/24/2015 19     ALT (U/L)   Date Value   02/24/2015 19     BUN (mg/dL)   Date Value   66/44/0347 10  BP Readings from Last 3 Encounters:   03/28/15 130/80   02/20/15 120/82   10/12/14 134/78          (goal 120/80)    Past Medical History   Diagnosis Date   ??? Hay fever    ??? Osteoarthritis    ??? Anxiety    ??? Hyperlipidemia    ??? Headache(784.0)    ??? Hypertension    ??? Depression    ??? DM (diabetes mellitus) (HCC)    ??? Urinary incontinence    ??? Unspecified sleep apnea       Past Surgical History   Procedure Laterality Date   ??? Carpal tunnel release Right 1983   ??? Knee cartilage surgery Right 1973   ??? Cyst removal Right      x2   ??? Hysterectomy  1991   ??? Hernia repair Left 1990     femoral   ??? Neuroma surgery Left 1990     hernia site   ??? Breast lumpectomy Right    ??? Cardiac catheterization  2005,2010   ??? Knee arthroplasty Left    ??? Total knee arthroplasty Left 2013       Family History   Problem Relation Age of Onset   ??? Osteoporosis Mother    ??? Arthritis Mother    ??? Stroke Mother        History   Substance Use Topics   ??? Smoking status: Never Smoker    ??? Smokeless tobacco: Never Used   ??? Alcohol Use: Yes      Current Outpatient Prescriptions   Medication Sig Dispense Refill   ??? amoxicillin (AMOXIL) 500 MG capsule Take  4 capsules by mouth once for 1 dose 4 capsule 3   ??? vitamin D (CHOLECALCIFEROL) 1000 UNITS TABS tablet Take 1 tablet by mouth daily (Patient taking differently: Take 3,000 Units by mouth daily ) 30 tablet 5   ??? levothyroxine (LEVOTHROID) 150 MCG tablet Take 1 tablet by mouth daily 30 tablet 3   ??? oxybutynin (DITROPAN) 5 MG tablet Take 1 tablet by mouth 3 times daily 90 tablet 3   ??? gabapentin (NEURONTIN) 100 MG capsule Take 1 capsule by mouth daily 30 capsule 3   ??? metFORMIN (GLUCOPHAGE) 500 MG tablet TAKE 1 TABLET BY MOUTH EVERY DAY WITH BREAKFAST 90 tablet 3   ??? ezetimibe (ZETIA) 10 MG tablet Take 1 tablet by mouth daily 90 tablet 3   ??? amLODIPine (NORVASC) 5 MG tablet      ??? valsartan (DIOVAN) 80 MG tablet Take 1 tablet by mouth daily 90 tablet 3   ??? repaglinide (PRANDIN) 1 MG tablet Take 1 tablet by mouth 3 times daily (before meals) 270 tablet 3   ??? HYDROcodone-acetaminophen (NORCO) 5-325 MG per tablet   0   ??? dicyclomine (BENTYL) 10 MG capsule Take 1 capsule by mouth 4 times daily (before meals and nightly) 120 capsule 3   ??? LORazepam (ATIVAN) 1 MG tablet Take 1 tablet by mouth as needed for Anxiety. 30 tablet 1   ??? aspirin 81 MG tablet Take 81 mg by mouth daily.     ??? pravastatin (PRAVACHOL) 20 MG tablet Take 1 tablet by mouth every evening 30 tablet 3     No current facility-administered medications for this visit.     Allergies   Allergen Reactions   ??? Ciprofloxacin Itching     IV cipro only   ??? Demerol Hcl [Meperidine]    ??? Tetanus Toxoids  Health Maintenance   Topic Date Due   ??? HEPATITIS  C SCREENING  August 03, 1946   ??? OBESITY BMI >= 30  03/26/1966   ??? PNEUMO VAC >= 65 (2 of 2 - PPSV23) 10/29/2013   ??? FOOT EXAM  02/01/2015   ??? MICROALBUMINURIA  02/24/2015   ??? FLU VACCINE YEARLY (ADULT)  05/29/2015   ??? LIPIDS  07/22/2015   ??? HEMOGLOBIN A1C  08/26/2015   ??? OPHTHALMOLOGY EXAM  10/29/2015   ??? BREAST CANCER SCREENING  09/06/2016   ??? TETANUS VACCINE ADULT (11 YEARS AND UP)  04/29/2023   ??? COLON CANCER  SCREENING COLONOSCOPY  04/29/2023   ??? ZOSTAVAX VACCINE  Addressed   ??? DEXA SCAN WOMEN (modify frequency per FRAX score)  Addressed       Subjective:      Review of Systems   Constitutional: Positive for fatigue. Negative for fever, chills and diaphoresis.   HENT: Negative for congestion and sore throat.    Respiratory: Positive for cough and shortness of breath. Negative for wheezing.    Cardiovascular: Positive for palpitations (occ with cough). Negative for chest pain.   Gastrointestinal: Negative for nausea, vomiting, abdominal pain, anorexia and change in bowel habit.   Musculoskeletal: Positive for arthralgias. Negative for myalgias, joint swelling and neck pain.   Skin: Negative for rash.   Neurological: Negative for vertigo, weakness, numbness and headaches.       Objective:     Physical Exam   Constitutional: She is oriented to person, place, and time. She appears well-developed and well-nourished.   HENT:   Head: Normocephalic and atraumatic.   Eyes: Pupils are equal, round, and reactive to light.   Neck: Neck supple. No JVD present.   Cardiovascular: Normal rate and regular rhythm.    No murmur heard.  Pulmonary/Chest: Effort normal and breath sounds normal.   Abdominal: Soft. Bowel sounds are normal. There is no tenderness.   Musculoskeletal: She exhibits edema (trace to 1+).   Neurological: She is alert and oriented to person, place, and time. No cranial nerve deficit.   Skin: Skin is warm and dry.   Psychiatric: She has a normal mood and affect.   Nursing note and vitals reviewed.    BP 130/80 mmHg   Pulse 62   Resp 18   Wt 270 lb (122.471 kg)    Assessment:      1. Peripheral edema  XR Chest Standard TWO VW    Echocardiogram complete   2. Shortness of breath  XR Chest Standard TWO VW    Echocardiogram complete   3. Essential hypertension     4. Type 2 diabetes mellitus without complication (HCC)               Plan:      Return in about 6 weeks (around 05/09/2015).    Orders Placed This Encounter    Procedures   ??? XR Chest Standard TWO VW     Standing Status: Future      Number of Occurrences:       Standing Expiration Date: 03/27/2016   ??? Echocardiogram complete     Standing Status: Future      Number of Occurrences:       Standing Expiration Date: 05/27/2015     Order Specific Question:  Reason for exam:     Answer:  edema, dyspnea     Orders Placed This Encounter   Medications   ??? amoxicillin (AMOXIL) 500 MG capsule  Sig: Take 4 capsules by mouth once for 1 dose     Dispense:  4 capsule     Refill:  3       Patient given educational materials - see patient instructions.  Discussed use, benefit, and side effects of prescribed medications.  All patient questions answered. Pt voiced understanding. Reviewed health maintenance.  Instructed to continue current medications, diet and exercise.  Patient agreed with treatment plan. Follow up as directed.     Electronically signed by Leanora Ivanoff, DO on 03/28/2015 at 3:16 PM

## 2015-04-03 ENCOUNTER — Inpatient Hospital Stay: Payer: MEDICARE | Attending: Internal Medicine | Primary: Family Medicine

## 2015-04-03 ENCOUNTER — Ambulatory Visit: Admit: 2015-04-03 | Discharge: 2015-04-26 | Payer: MEDICARE | Primary: Family Medicine

## 2015-04-03 LAB — ECHOCARDIOGRAM COMPLETE 2D W DOPPLER W COLOR: Left Ventricular Ejection Fraction: 55

## 2015-04-04 NOTE — Telephone Encounter (Signed)
-----   Message from Leanora IvanoffStanley J Orlop, DO sent at 04/04/2015  1:35 PM EDT -----  Please notify patient of normal results.

## 2015-04-04 NOTE — Telephone Encounter (Signed)
Pt aware of echo results

## 2015-04-04 NOTE — Telephone Encounter (Signed)
Patient notified normal X-ray, should would like echo results done on same day. Please review and advise.

## 2015-04-04 NOTE — Telephone Encounter (Signed)
Echo shows mild LVH. Nothing to explain current symptoms.

## 2015-04-12 NOTE — Telephone Encounter (Signed)
Pt is taking gabapentin TID, so needs new script for #270 .To soon for refill  Approve or deny

## 2015-04-14 ENCOUNTER — Ambulatory Visit (HOSPITAL_COMMUNITY)
Admission: RE | Admit: 2015-04-14 | Discharge: 2015-04-14 | Disposition: A | Discharge status: Home / Self Care | Payer: Medicare Other | Source: Ambulatory Visit | Attending: Urology | Admitting: Urology

## 2015-04-14 ENCOUNTER — Other Ambulatory Visit (HOSPITAL_COMMUNITY): Payer: Self-pay | Admitting: Urology

## 2015-04-14 DIAGNOSIS — C649 Malignant neoplasm of unspecified kidney, except renal pelvis: Secondary | ICD-10-CM | POA: Diagnosis present

## 2015-04-14 MED ORDER — GABAPENTIN 100 MG PO CAPS
100 MG | ORAL_CAPSULE | Freq: Every day | ORAL | Status: DC
Start: 2015-04-14 — End: 2015-04-24

## 2015-04-24 MED ORDER — GABAPENTIN 100 MG PO CAPS
100 MG | ORAL_CAPSULE | Freq: Three times a day (TID) | ORAL | 3 refills | Status: AC
Start: 2015-04-24 — End: 2023-05-06

## 2015-04-24 NOTE — Telephone Encounter (Signed)
Ok to increase to TID, new script [facility] to [facility]

## 2015-04-24 NOTE — Telephone Encounter (Signed)
Patient called to state that she is out of gabapentin and that she was told she could take it 3 times a day if it helps, patient script was filled for qd on 04/13/15 and she is out. Note in the computer on the 16th requesting 270 tabs for TID and only 30 tabs qd was approved. Please advise.

## 2015-05-22 ENCOUNTER — Encounter: Attending: Internal Medicine | Primary: Family Medicine

## 2015-05-22 LAB — T4, FREE: T4 Free: 0.95

## 2015-05-26 NOTE — Telephone Encounter (Signed)
-----   Message from Laurena Slimmer, NP sent at 05/25/2015  8:26 PM EDT -----  Normal thyroid testing

## 2015-05-26 NOTE — Telephone Encounter (Signed)
Patient notified normal TSH.

## 2015-05-31 ENCOUNTER — Ambulatory Visit: Admit: 2015-05-31 | Discharge: 2015-05-31 | Payer: MEDICARE | Attending: Internal Medicine | Primary: Family Medicine

## 2015-05-31 DIAGNOSIS — E119 Type 2 diabetes mellitus without complications: Secondary | ICD-10-CM

## 2015-05-31 MED ORDER — FLUCONAZOLE 150 MG PO TABS
150 MG | ORAL_TABLET | Freq: Once | ORAL | 0 refills | Status: AC
Start: 2015-05-31 — End: 2015-05-31

## 2015-05-31 NOTE — Progress Notes (Signed)
[facility]  [address]  [address]  [place] [address]  Dept: [phone]  Dept Fax: [phone]    Brandy [name] is a 69 y.o. female who presents today for her medical conditions/complaints as noted below.  Brandy [name] is c/o of   Chief Complaint   Patient presents with   ??? Hypertension     3 mo check-doing ok   ??? Diabetes     3 mo check-   ??? Vaginitis     needs diflucan     DIABETES and HYPERTENSION visit    BP Readings from Last 3 Encounters:   05/31/15 134/76   03/28/15 130/80   02/20/15 120/82        HEMOGLOBIN A1C (%)   Date Value   02/20/2015 7.2   07/21/2014 7.7   02/23/2014 4.2     A1C (%)   Date Value   12/14/2010 7.0 (H)     MICROALB/CRT. RATIO (mcg/mg creat)   Date Value   05/31/2013 11     LDL CHOLESTEROL (mg/dL)   Date Value   46/96/2952 120     LDL CALCULATED (mg/dL)   Date Value   84/13/2440 138     HDL (mg/dL)   Date Value   08/24/2535 33 (A)     BUN (mg/dL)   Date Value   64/40/3474 10     CREATININE   Date Value   02/24/2015 0.75   03/22/2011 0.70 mg/dL     GLUCOSE (mg/dL)   Date Value   25/95/6387 158   12/14/2010 165 (H)            Are you taking your medications for Diabetes? -  Yes   Are you having any side effects from Diabetes medications? - No    Are you taking your medications for hypertension? -   Yes   Are you having any side effects from hypertension medications? -  No    Are you taking any cholesterol medication?  -  Yes   Are you having any side effects from cholesterol medications? -  No    Are you taking an Aspirin daily? -  Yes               Are you taking all your prescribed medications? Yes             If NO, why? -  N/A    Are you taking any over-the-counter medicines, vitamins, or herbal medicines? no     Have you changed or stopped any medications since your last visit including any over-the-counter medicines, vitamins, or herbal medicines? no     Have you seen any other physician or provider since your  last visit yes - ortho    Have you had any other diagnostic tests since your last visit? yes - labs, chest x ray    Have you had your annual diabetic retinal (eye) exam?  yes - last month    Diabetic Patient Self-Management Goal(s):     Increase activity-exercise for 30 min daily - 3 to 4 days per week    Improved diet - adhere to recommended diabetic diet/low cholesterol/low sodium diet     Take medications as prescribed and call the office if any troubles with medications develop    will check blood sugars at least one time daily and blood pressure weekly    schedule an appointment with a diabetes educator     schedule yearly diabetic eye exam   []  will work on weight reduction program   []    Barriers to success: none  Plan for overcoming my barriers: N/A     Confidence: 10/10  Date goal set: 05/31/15  Date expected to reach goal: 1week               Microalbumin performed if applicable? - Yes   (due every 6 to 12 months)    Shoes and socks removed? Yes  Monofilament placed on counter? - Yes    Patient Self-Management Goal(s) for managing Hypertension:   []  I will increase my physical activity level  []  I will exercise for 30 minutes, 3 to 5 days a week  []  I will take all medications as prescribed  []  I will follow low cholesterol/low sodium diet recommendation  []  I will work on weight reduction  []  I will follow up with specialists as recommend  []  I will check my blood pressure weekly   []    Barriers to success: none  Plan for overcoming my barriers: N/A     Confidence: 10/10  Date goal set: 05/31/15  Date expected to reach goal: 1week    Tobacco use:  Patient  reports that she has never smoked. She has never used smokeless tobacco.    If Smoker - Cessation materials given? NA       1-800-QUIT-NOW [phone])     Diabetic Health Maintenance   Health Maintenance Due   Topic Date Due   ??? MICROALBUMINURIA  02/24/2015       Medical history Review  Past Medical, Family, and Social History reviewed and  does contribute to the patient presenting condition of HTN and DM.    HPI:     Hypertension   This is a chronic problem. The current episode started more than 1 year ago. The problem is controlled. Associated symptoms include headaches and malaise/fatigue. Pertinent negatives include no anxiety, blurred vision, chest pain, neck pain, orthopnea, palpitations, peripheral edema, PND, shortness of breath or sweats. There are no associated agents to hypertension. Risk factors for coronary artery disease include diabetes mellitus, dyslipidemia and obesity. Past treatments include calcium channel blockers and angiotensin blockers. The current treatment provides significant improvement. There are no compliance problems.  There is no history of kidney disease or heart failure.       HEMOGLOBIN A1C (%)   Date Value   02/20/2015 7.2   07/21/2014 7.7   02/23/2014 4.2     A1C (%)   Date Value   12/14/2010 7.0 (H)             ( goal A1C is < 7)   MICROALB/CRT. RATIO (mcg/mg creat)   Date Value   05/31/2013 11     LDL CHOLESTEROL (mg/dL)   Date Value   98/11/914708/01/2013 120   07/15/2012 139 (H)   12/14/2010 129 (H)     LDL CALCULATED (mg/dL)   Date Value   82/95/621309/24/2015 138       (goal LDL is <100)   AST (U/L)   Date Value   02/24/2015 19     ALT (U/L)   Date Value   02/24/2015 19     BUN (mg/dL)   Date Value   08/65/784604/29/2016 10     BP Readings from Last 3 Encounters:   05/31/15 134/76   03/28/15 130/80   02/20/15 120/82          (goal 120/80)    Past  Medical History   Diagnosis Date   ??? Anxiety    ??? Depression    ??? DM (diabetes mellitus) (HCC)    ??? Hay fever    ??? Headache(784.0)    ??? Hyperlipidemia    ??? Hypertension    ??? Osteoarthritis    ??? Unspecified sleep apnea    ??? Urinary incontinence       Past Surgical History   Procedure Laterality Date   ??? Carpal tunnel release Right 1983   ??? Knee cartilage surgery Right 1973   ??? Cyst removal Right      x2   ??? Hysterectomy  1991   ??? Hernia repair Left 1990     femoral   ??? Neuroma surgery Left 1990      hernia site   ??? Breast lumpectomy Right    ??? Cardiac catheterization  2005,2010   ??? Knee arthroplasty Left    ??? Total knee arthroplasty Left 2013       Family History   Problem Relation Age of Onset   ??? Osteoporosis Mother    ??? Arthritis Mother    ??? Stroke Mother        Social History   Substance Use Topics   ??? Smoking status: Never Smoker   ??? Smokeless tobacco: Never Used   ??? Alcohol use Yes      Current Outpatient Prescriptions   Medication Sig Dispense Refill   ??? fluconazole (DIFLUCAN) 150 MG tablet Take 1 tablet by mouth once for 1 dose 1 tablet 0   ??? gabapentin (NEURONTIN) 100 MG capsule Take 1 capsule by mouth 3 times daily 270 capsule 3   ??? vitamin D (CHOLECALCIFEROL) 1000 UNITS TABS tablet Take 1 tablet by mouth daily (Patient taking differently: Take 3,000 Units by mouth daily ) 30 tablet 5   ??? levothyroxine (LEVOTHROID) 150 MCG tablet Take 1 tablet by mouth daily 30 tablet 3   ??? pravastatin (PRAVACHOL) 20 MG tablet Take 1 tablet by mouth every evening 30 tablet 3   ??? oxybutynin (DITROPAN) 5 MG tablet Take 1 tablet by mouth 3 times daily 90 tablet 3   ??? metFORMIN (GLUCOPHAGE) 500 MG tablet TAKE 1 TABLET BY MOUTH EVERY DAY WITH BREAKFAST 90 tablet 3   ??? ezetimibe (ZETIA) 10 MG tablet Take 1 tablet by mouth daily 90 tablet 3   ??? amLODIPine (NORVASC) 5 MG tablet      ??? valsartan (DIOVAN) 80 MG tablet Take 1 tablet by mouth daily 90 tablet 3   ??? repaglinide (PRANDIN) 1 MG tablet Take 1 tablet by mouth 3 times daily (before meals) 270 tablet 3   ??? HYDROcodone-acetaminophen (NORCO) 5-325 MG per tablet   0   ??? dicyclomine (BENTYL) 10 MG capsule Take 1 capsule by mouth 4 times daily (before meals and nightly) 120 capsule 3   ??? LORazepam (ATIVAN) 1 MG tablet Take 1 tablet by mouth as needed for Anxiety. 30 tablet 1   ??? aspirin 81 MG tablet Take 81 mg by mouth daily.       No current facility-administered medications for this visit.      Allergies   Allergen Reactions   ??? Ciprofloxacin Itching     IV cipro only   ???  Demerol Hcl [Meperidine]    ??? Tetanus Toxoids        Health Maintenance   Topic Date Due   ??? MICROALBUMINURIA  02/24/2015   ??? FOOT EXAM  07/29/2015 (Originally 02/01/2015)   ???  PNEUMO VAC >= 65 (2 of 2 - PPSV23) 07/29/2015 (Originally 04/28/2014)   ??? Tetanus (DTaP/Tdap/Td) (1 - Tdap) 07/29/2015 (Originally 03/27/1967)   ??? Flu Vaccine (1) 07/29/2015 (Originally 05/29/2015)   ??? COLON CANCER SCREENING COLONOSCOPY  07/29/2015 (Originally 03/26/1998)   ??? HEPATITIS  C SCREENING  05/30/2016 (Originally 03-May-1946)   ??? LIPIDS  07/22/2015   ??? HEMOGLOBIN A1C  08/26/2015   ??? OPHTHALMOLOGY EXAM  10/29/2015   ??? BREAST CANCER SCREENING  09/06/2016   ??? ZOSTAVAX VACCINE  Addressed   ??? DEXA SCAN WOMEN (modify frequency per FRAX score)  Addressed       Subjective:      Review of Systems   Constitutional: Positive for malaise/fatigue.   Eyes: Negative for blurred vision.   Respiratory: Negative for shortness of breath and wheezing.    Cardiovascular: Negative for chest pain, palpitations, orthopnea and PND.   Gastrointestinal: Negative for abdominal pain and constipation.   Genitourinary: Positive for urgency.   Musculoskeletal: Negative for neck pain.   Neurological: Positive for headaches.       Objective:     Physical Exam   Constitutional: She is oriented to person, place, and time. She appears well-developed and well-nourished.   HENT:   Head: Normocephalic and atraumatic.   Eyes: Pupils are equal, round, and reactive to light.   Neck: Neck supple. No JVD present.   Cardiovascular: Normal rate and regular rhythm.    No murmur heard.  Pulmonary/Chest: Effort normal and breath sounds normal.   Abdominal: Soft. Bowel sounds are normal. There is no tenderness.   Musculoskeletal: She exhibits no edema.   Neurological: She is alert and oriented to person, place, and time. No cranial nerve deficit.   Skin: Skin is warm and dry.   Psychiatric: She has a normal mood and affect.   Nursing note and vitals reviewed.    Visit Vitals   ??? BP 134/76   ???  Pulse 80   ??? Resp 16   ??? Ht 5\' 6"  (1.676 m)   ??? Wt 271 lb 3.2 oz (123 kg)   ??? BMI 43.77 kg/m2       Assessment:      1. Type 2 diabetes mellitus without complication, without long-term current use of insulin (HCC)  Microalbumin, Ur    Comprehensive Metabolic Panel    Hemoglobin A1C   2. Essential hypertension  CBC    Comprehensive Metabolic Panel    TSH without Reflex   3. Pure hypercholesterolemia  Lipid Panel             Plan:      Return in about 4 months (around 09/30/2015).    Orders Placed This Encounter   Procedures   ??? Microalbumin, Ur   ??? CBC     Standing Status:   Future     Standing Expiration Date:   09/30/2015   ??? Comprehensive Metabolic Panel     Standing Status:   Future     Standing Expiration Date:   09/30/2015   ??? Hemoglobin A1C     Standing Status:   Future     Standing Expiration Date:   09/30/2015   ??? Lipid Panel     Standing Status:   Future     Standing Expiration Date:   09/30/2015     Order Specific Question:   Is Patient Fasting?/# of Hours     Answer:   10-12 hours   ??? TSH without Reflex  Standing Status:   Future     Standing Expiration Date:   09/30/2015     Orders Placed This Encounter   Medications   ??? fluconazole (DIFLUCAN) 150 MG tablet     Sig: Take 1 tablet by mouth once for 1 dose     Dispense:  1 tablet     Refill:  0       Patient given educational materials - see patient instructions.  Discussed use, benefit, and side effects of prescribed medications.  All patient questions answered. Pt voiced understanding. Reviewed health maintenance.  Instructed to continue current medications, diet and exercise.  Patient agreed with treatment plan. Follow up as directed.     Electronically signed by Leanora Ivanoff, DO on 05/31/2015 at 3:13 PM

## 2015-06-08 NOTE — Telephone Encounter (Signed)
Patient notified Micro results.

## 2015-06-29 MED ORDER — LEVOTHYROXINE SODIUM 150 MCG PO TABS
150 MCG | ORAL_TABLET | Freq: Every day | ORAL | 3 refills | Status: DC
Start: 2015-06-29 — End: 2016-07-05

## 2015-07-25 MED ORDER — DICYCLOMINE HCL 10 MG PO CAPS
10 MG | ORAL_CAPSULE | Freq: Four times a day (QID) | ORAL | 3 refills | Status: DC
Start: 2015-07-25 — End: 2020-03-17

## 2015-07-25 NOTE — Telephone Encounter (Signed)
Patient states she is depressed, family issues and is upsetting stomach.

## 2015-08-28 LAB — LIPID PANEL
Chol/HDL Ratio: 6.1
Cholesterol, Total: 215 mg/dL
HDL: 35 mg/dL (ref 35–70)
LDL Calculated: 129 mg/dL (ref 0–160)
Triglycerides: 256 mg/dL
VLDL: 51 mg/dL

## 2015-08-28 LAB — TSH: TSH: 5.42 u[IU]/mL

## 2015-08-28 LAB — HEMOGLOBIN A1C: Hemoglobin A1C: 4.1 %

## 2015-08-30 ENCOUNTER — Encounter: Attending: Internal Medicine | Primary: Family Medicine

## 2015-09-01 ENCOUNTER — Encounter

## 2015-09-06 LAB — POCT URINALYSIS DIPSTICK
Glucose, UA POC: NEGATIVE
Ketones, UA: NEGATIVE
Nitrite, UA: NEGATIVE
Protein, UA POC: NEGATIVE
Spec Grav, UA: 1.015
Urobilinogen, UA: NEGATIVE
pH, UA: 6

## 2015-09-06 MED ORDER — SUCRALFATE 1 G PO TABS
1 GM | ORAL_TABLET | Freq: Four times a day (QID) | ORAL | 3 refills | Status: DC
Start: 2015-09-06 — End: 2019-11-22

## 2015-09-06 MED ORDER — CITALOPRAM HYDROBROMIDE 20 MG PO TABS
20 MG | ORAL_TABLET | Freq: Every day | ORAL | 3 refills | Status: DC
Start: 2015-09-06 — End: 2015-10-02

## 2015-09-06 MED ORDER — PRAVASTATIN SODIUM 40 MG PO TABS
40 MG | ORAL_TABLET | Freq: Every evening | ORAL | 5 refills | Status: DC
Start: 2015-09-06 — End: 2015-09-29

## 2015-09-06 MED ORDER — PANTOPRAZOLE SODIUM 40 MG PO TBEC
40 MG | ORAL_TABLET | Freq: Two times a day (BID) | ORAL | 3 refills | Status: DC
Start: 2015-09-06 — End: 2015-11-02

## 2015-09-06 MED ORDER — CIPROFLOXACIN HCL 500 MG PO TABS
500 MG | ORAL_TABLET | Freq: Two times a day (BID) | ORAL | 0 refills | Status: AC
Start: 2015-09-06 — End: 2015-09-11

## 2015-09-06 NOTE — Progress Notes (Signed)
[facility]IC[facility]  [address]  [address]  [place] [address]  Dept: [phone]  Dept Fax: [phone]    Brandy [name] is a 69 y.o. female who presents today for her medical conditions/complaints as noted below.  Brandy [name] is c/o of   Chief Complaint   Patient presents with   ??? Flank Pain     bilateral   ??? GI Problem     pain in stomach and feels like she has acid reflu   ??? Depression     feels "so tired" all the time     Have you changed or stopped any medications since your last visit including any over-the-counter medicines, vitamins, or herbal medicines? no     Are you taking all your prescribed medications? Yes          If NO, why? -  N/A    Have you seen any other physician or provider since your last visit no    Have you had any other diagnostic tests since your last visit? no    Tobacco use:  Patient  reports that she has never smoked. She has never used smokeless tobacco.   If a smoker, cessation materials provided? NA   1-800-QUIT-NOW [phone])     Medical history Review  Past Medical, Family, and Social History reviewed and does not contribute to the patient presenting condition    Health Maintenance   Topic Date Due   ??? Colon cancer screen colonoscopy  03/26/1998   ??? PNEUMO VAC >= 65 (2 of 2 - PPSV23) 04/28/2014   ??? Diabetic foot exam  02/01/2015   ??? Hepatitis C screen  05/30/2016 (Originally 09-Feb-1946)   ??? Diabetic retinal exam  10/29/2015   ??? Diabetic hemoglobin A1C test  02/25/2016   ??? Diabetic microalbuminuria test  05/30/2016   ??? Lipid screen  08/27/2016   ??? Breast cancer screen  09/06/2016   ??? Zostavax vaccine  Addressed   ??? DEXA (modify frequency per FRAX score)  Addressed   ??? Flu vaccine  Completed                   HPI:     HPI Comments: Having some stress  + epigastric pain on/off x 2 mos  Using omeprazole 40 mg daily, helps sometimes  Having back pain, bilateral- took an old norco that helped and using salonpas  Unable  to take nsaids      Abdominal Pain   This is a recurrent problem. The current episode started more than 1 month ago (2 mos). The onset quality is gradual. The problem occurs intermittently. The problem has been waxing and waning. The pain is located in the epigastric region. The pain is moderate. The quality of the pain is aching and colicky. The abdominal pain does not radiate. Associated symptoms include arthralgias, dysuria, frequency and myalgias. Pertinent negatives include no constipation, diarrhea, fever, headaches, hematuria, nausea or vomiting. Nothing aggravates the pain. Relieved by: omeprazole. She has tried proton pump inhibitors for the symptoms. The treatment provided mild relief. There is no history of abdominal surgery.       HEMOGLOBIN A1C (%)   Date Value   08/28/2015 4.1   02/20/2015 7.2   07/21/2014 7.7     A1C (%)   Date Value   12/14/2010 7.0 (H)             ( goal A1C is < 7)  MICROALB/CRT. RATIO (mcg/mg creat)   Date Value   05/31/2013 11     LDL CHOLESTEROL (mg/dL)   Date Value   65/78/4696 120   07/15/2012 139 (H)   12/14/2010 129 (H)     LDL CALCULATED (mg/dL)   Date Value   29/52/8413 129   07/21/2014 138       (goal LDL is <100)   AST (U/L)   Date Value   02/24/2015 19     ALT (U/L)   Date Value   02/24/2015 19     BUN (mg/dL)   Date Value   24/40/1027 10     BP Readings from Last 3 Encounters:   09/06/15 140/78   05/31/15 134/76   03/28/15 130/80          (goal 120/80)    Past Medical History   Diagnosis Date   ??? Anxiety    ??? Depression    ??? DM (diabetes mellitus) (HCC)    ??? Hay fever    ??? Headache(784.0)    ??? Hyperlipidemia    ??? Hypertension    ??? Osteoarthritis    ??? Unspecified sleep apnea    ??? Urinary incontinence       Past Surgical History   Procedure Laterality Date   ??? Carpal tunnel release Right 1983   ??? Knee cartilage surgery Right 1973   ??? Cyst removal Right      x2   ??? Hysterectomy  1991   ??? Hernia repair Left 1990     femoral   ??? Neuroma surgery Left 1990     hernia site    ??? Breast lumpectomy Right    ??? Cardiac catheterization  2005,2010   ??? Knee arthroplasty Left    ??? Total knee arthroplasty Left 2013       Family History   Problem Relation Age of Onset   ??? Osteoporosis Mother    ??? Arthritis Mother    ??? Stroke Mother        Social History   Substance Use Topics   ??? Smoking status: Never Smoker   ??? Smokeless tobacco: Never Used   ??? Alcohol use Yes      Current Outpatient Prescriptions   Medication Sig Dispense Refill   ??? omeprazole (PRILOSEC) 10 MG delayed release capsule Take 20 mg by mouth daily     ??? pravastatin (PRAVACHOL) 40 MG tablet Take 1 tablet by mouth every evening 30 tablet 5   ??? pantoprazole (PROTONIX) 40 MG tablet Take 1 tablet by mouth 2 times daily 60 tablet 3   ??? sucralfate (CARAFATE) 1 GM tablet Take 1 tablet by mouth 4 times daily 120 tablet 3   ??? citalopram (CELEXA) 20 MG tablet Take 1 tablet by mouth daily 30 tablet 3   ??? ciprofloxacin (CIPRO) 500 MG tablet Take 1 tablet by mouth 2 times daily for 5 days 10 tablet 0   ??? dicyclomine (BENTYL) 10 MG capsule Take 1 capsule by mouth 4 times daily (before meals and nightly) 120 capsule 3   ??? levothyroxine (LEVOTHROID) 150 MCG tablet Take 1 tablet by mouth daily 90 tablet 3   ??? gabapentin (NEURONTIN) 100 MG capsule Take 1 capsule by mouth 3 times daily 270 capsule 3   ??? vitamin D (CHOLECALCIFEROL) 1000 UNITS TABS tablet Take 1 tablet by mouth daily (Patient taking differently: Take 3,000 Units by mouth daily ) 30 tablet 5   ??? metFORMIN (GLUCOPHAGE) 500 MG tablet TAKE 1 TABLET  BY MOUTH EVERY DAY WITH BREAKFAST 90 tablet 3   ??? amLODIPine (NORVASC) 5 MG tablet      ??? valsartan (DIOVAN) 80 MG tablet Take 1 tablet by mouth daily 90 tablet 3   ??? repaglinide (PRANDIN) 1 MG tablet Take 1 tablet by mouth 3 times daily (before meals) 270 tablet 3   ??? HYDROcodone-acetaminophen (NORCO) 5-325 MG per tablet   0   ??? LORazepam (ATIVAN) 1 MG tablet Take 1 tablet by mouth as needed for Anxiety. 30 tablet 1   ??? aspirin 81 MG tablet Take  81 mg by mouth daily.       No current facility-administered medications for this visit.      Allergies   Allergen Reactions   ??? Ciprofloxacin Itching     IV cipro only   ??? Demerol Hcl [Meperidine]    ??? Tetanus Toxoids        Health Maintenance   Topic Date Due   ??? Colon cancer screen colonoscopy  03/26/1998   ??? PNEUMO VAC >= 65 (2 of 2 - PPSV23) 04/28/2014   ??? Diabetic foot exam  02/01/2015   ??? Hepatitis C screen  05/30/2016 (Originally [birth year])   ??? Diabetic retinal exam  10/29/2015   ??? Diabetic hemoglobin A1C test  02/25/2016   ??? Diabetic microalbuminuria test  05/30/2016   ??? Lipid screen  08/27/2016   ??? Breast cancer screen  09/06/2016   ??? Zostavax vaccine  Addressed   ??? DEXA (modify frequency per FRAX score)  Addressed   ??? Flu vaccine  Completed       Subjective:      Review of Systems   Constitutional: Negative for fatigue and fever.   HENT: Negative for rhinorrhea and sore throat.    Eyes: Negative for photophobia and visual disturbance.   Respiratory: Negative for cough and shortness of breath. Chest tightness: epigastric pain/discomfort with reflux.    Cardiovascular: Negative for chest pain, palpitations and leg swelling.   Gastrointestinal: Positive for abdominal pain. Negative for constipation, diarrhea, nausea and vomiting.   Endocrine: Negative for polydipsia and polyuria.   Genitourinary: Positive for dysuria and frequency. Negative for hematuria and urgency.   Musculoskeletal: Positive for arthralgias and myalgias.   Skin: Negative for rash and wound.   Allergic/Immunologic: Negative for environmental allergies and immunocompromised state.   Neurological: Negative for dizziness, light-headedness and headaches.   Hematological: Negative for adenopathy. Does not bruise/bleed easily.   Psychiatric/Behavioral: Negative for self-injury, sleep disturbance and suicidal ideas. The patient is nervous/anxious.        Objective:     Physical Exam   Constitutional: She is oriented to person, place, and time.  She appears well-developed and well-nourished.   HENT:   Head: Normocephalic and atraumatic.   Eyes: Conjunctivae are normal. Pupils are equal, round, and reactive to light.   Neck: Normal range of motion. Neck supple.   Cardiovascular: Normal rate, regular rhythm and normal heart sounds.    No murmur heard.  Pulmonary/Chest: Effort normal and breath sounds normal.   Abdominal: Soft. Bowel sounds are normal. There is no tenderness.   Musculoskeletal: Normal range of motion. She exhibits no edema.   Neurological: She is alert and oriented to person, place, and time. No cranial nerve deficit.   Skin: Skin is warm and dry.   Psychiatric: Her behavior is normal. Her mood appears anxious. She exhibits a depressed mood.   Tearful when talking about stress at home   Nursing note  and vitals reviewed.    Visit Vitals   ??? BP 140/78 (Site: Left Arm, Position: Sitting, Cuff Size: Large Adult)   ??? Pulse 84   ??? Resp 18   ??? Ht 5\' 6"  (1.676 m)   ??? Wt 270 lb (122.5 kg)   ??? BMI 43.58 kg/m2       Assessment:      1. Acute cystitis without hematuria  CANCELED: Urine Culture   2. PUD (peptic ulcer disease)     3. Flank pain, acute  POCT Urinalysis no Micro   4. Essential hypertension -controlled/stable    5. Reactive depression     6. Encounter for screening mammogram for breast cancer  MAM Digital Screen Bilateral   7. Pure hypercholesterolemia     8. Type 2 diabetes mellitus without complication, without long-term current use of insulin (HCC) - controlled/stable     HLD         Plan:      Return in about 1 month (around 10/06/2015) for depression.    Orders Placed This Encounter   Procedures   ??? MAM Digital Screen Bilateral     Standing Status:   Future     Standing Expiration Date:   11/05/2016     Order Specific Question:   Reason for exam:     Answer:   screening   ??? POCT Urinalysis no Micro     Orders Placed This Encounter   Medications   ??? pravastatin (PRAVACHOL) 40 MG tablet     Sig: Take 1 tablet by mouth every evening      Dispense:  30 tablet     Refill:  5   ??? pantoprazole (PROTONIX) 40 MG tablet     Sig: Take 1 tablet by mouth 2 times daily     Dispense:  60 tablet     Refill:  3   ??? sucralfate (CARAFATE) 1 GM tablet     Sig: Take 1 tablet by mouth 4 times daily     Dispense:  120 tablet     Refill:  3   ??? citalopram (CELEXA) 20 MG tablet     Sig: Take 1 tablet by mouth daily     Dispense:  30 tablet     Refill:  3   ??? ciprofloxacin (CIPRO) 500 MG tablet     Sig: Take 1 tablet by mouth 2 times daily for 5 days     Dispense:  10 tablet     Refill:  0      Likely ulcer- will start protonix bid x 4-6 weeks  Add carafate  Start celexa- increased stress over family issues, very tearful at appt  Advised to follow up with counseling  + uti will start cipro- not enough to send for culture  Screening mammogram  Will increase pravachol- for elevated lipids  Advised low fat low chol diet  Controlled a1c- can try to stop metformin and recheck in 3 mos  Call if worsening symptoms or no improvement  May benefit from gi/egd  F/u 1-2 mos to re eval     Patient given educational materials - see patient instructions.  Discussed use, benefit, and side effects of prescribed medications.  All patient questions answered. Pt voiced understanding. Reviewed health maintenance.  Instructed to continue current medications, diet and exercise.  Patient agreed with treatment plan. Follow up as directed.     Electronically signed by Laurena Slimmer, NP on 09/07/2015 at 8:55 PM

## 2015-09-23 MED ORDER — REPAGLINIDE 1 MG PO TABS
1 MG | ORAL_TABLET | ORAL | 0 refills | Status: DC
Start: 2015-09-23 — End: 2015-10-02

## 2015-09-23 MED ORDER — AMLODIPINE BESYLATE 5 MG PO TABS
5 MG | ORAL_TABLET | ORAL | 0 refills | Status: DC
Start: 2015-09-23 — End: 2016-01-01

## 2015-09-27 NOTE — Telephone Encounter (Deleted)
Pharmacy requests refill of pravachol, last office visit

## 2015-09-29 MED ORDER — PRAVASTATIN SODIUM 40 MG PO TABS
40 MG | ORAL_TABLET | Freq: Every evening | ORAL | 3 refills | Status: DC
Start: 2015-09-29 — End: 2016-10-08

## 2015-09-29 MED ORDER — VALSARTAN 80 MG PO TABS
80 MG | ORAL_TABLET | Freq: Every day | ORAL | 3 refills | Status: DC
Start: 2015-09-29 — End: 2016-11-05

## 2015-10-02 ENCOUNTER — Ambulatory Visit: Admit: 2015-10-02 | Discharge: 2015-10-02 | Payer: MEDICARE | Attending: Internal Medicine | Primary: Family Medicine

## 2015-10-02 ENCOUNTER — Encounter: Attending: Internal Medicine | Primary: Family Medicine

## 2015-10-02 ENCOUNTER — Encounter

## 2015-10-02 DIAGNOSIS — G44209 Tension-type headache, unspecified, not intractable: Secondary | ICD-10-CM

## 2015-10-02 MED ORDER — CITALOPRAM HYDROBROMIDE 40 MG PO TABS
40 MG | ORAL_TABLET | Freq: Every day | ORAL | 5 refills | Status: DC
Start: 2015-10-02 — End: 2015-11-02

## 2015-10-02 MED ORDER — LORAZEPAM 1 MG PO TABS
1 | ORAL_TABLET | ORAL | 1 refills | Status: DC | PRN
Start: 2015-10-02 — End: 2015-10-11

## 2015-10-02 NOTE — Progress Notes (Signed)
[facility]  [address]  [address]  [place] [address]  Dept: [phone]  Dept Fax: [phone]    Brandy [name] is a 69 y.o. female who presents today for her medical conditions/complaints as noted below.  Brandy [name] is c/o of   Chief Complaint   Patient presents with   ??? Depression   ??? Abdominal Pain   ??? Muscle Pain     lower back and hip   ??? Headache     HYPERTENSION visit     BP Readings from Last 3 Encounters:   10/02/15 130/72   09/06/15 140/78   05/31/15 134/76       LDL CALCULATED (mg/dL)   Date Value   65/78/4696 129     LDL CHOLESTEROL (mg/dL)   Date Value   29/52/8413 120     HDL (mg/dL)   Date Value   24/40/1027 35     BUN (mg/dL)   Date Value   25/36/6440 10     CREATININE   Date Value   02/24/2015 0.75   03/22/2011 0.70 mg/dL     GLUCOSE (mg/dL)   Date Value   34/74/2595 158   12/14/2010 165 (H)              Are you taking your medications for hypertension?  -  Yes   Are you having any side effects from the hypertension medications? -  No     Are you taking any cholesterol medication?  -   Yes   Are you having any side effects from cholesterol medications? - No    Are you taking all your prescribed medications? Yes   If NO, why? -  N/A    Are you taking any over-the-counter medicines, vitamins, or herbal medicines? - no     Have you changed or stopped any medications since your last visit including any over-the-counter medicines, vitamins, or herbal medicines? no     Have you seen any other physician or provider since your last visit no    Have you had any other diagnostic tests since your last visit? no    Patient Self-Management Goal(s) for managing Hypertension:   []  I will increase my physical activity level  []  I will exercise for 30 minutes, 3 to 5 days a week  []  I will take all medications as prescribed  []  I will follow low cholesterol/low sodium diet recommendation  []  I will work on weight reduction  []  I will follow up  with specialists as recommend  []  I will check my blood pressure weekly   []  depression  Barriers to success: none  Plan for overcoming my barriers: N/A     Confidence: 10/10  Date goal set: 10/02/15  Date expected to reach goal: 1day    Tobacco use:  Patient  reports that she has never smoked. She has never used smokeless tobacco.  If Smoker - Cessation materials given? NA    1-800-QUIT-NOW [phone])    ______________________________________________________________________  Medical History Review  Past Medical, Family, and Social History reviewed and does not contribute to the patient presenting condition    Health Maintenance Due   Topic Date Due   ??? Colon cancer screen colonoscopy  03/26/1998   ??? PNEUMO VAC >= 65 (2 of 2 - PPSV23) 04/28/2014   ??? 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 The current episode started more than 1 year ago. The onset quality is gradual. The problem occurs intermittently. The problem has been unchanged. The pain is located in the RUQ and epigastric region. The pain is at a severity of 4/10. The pain is mild. The quality of the pain is aching. The abdominal pain radiates to the RUQ. Associated symptoms include diarrhea (4-5 times day, some stool to liquid appearance) and headaches. Pertinent negatives include no dysuria or frequency. The pain is aggravated by eating. The pain is relieved by bowel movements. She has tried antacids and proton pump inhibitors for the symptoms. The treatment provided mild relief. Her past medical history is significant for abdominal surgery (hyst and hernia repair in the past).       HEMOGLOBIN A1C (%)   Date Value   08/28/2015 4.1   02/20/2015 7.2   07/21/2014 7.7     A1C (%)    Date Value   12/14/2010 7.0 (H)             ( goal A1C is < 7)   MICROALB/CRT. RATIO (mcg/mg creat)   Date Value   05/31/2013 11     LDL CHOLESTEROL (mg/dL)   Date Value   16/07/9603 120   07/15/2012 139 (H)   12/14/2010 129 (H)     LDL CALCULATED (mg/dL)   Date Value   54/06/8118 129   07/21/2014 138       (goal LDL is <100)   AST (U/L)   Date Value   02/24/2015 19     ALT (U/L)   Date Value   02/24/2015 19     BUN (mg/dL)   Date Value   14/78/2956 10     BP Readings from Last 3 Encounters:   10/02/15 130/72   09/06/15 140/78   05/31/15 134/76          (goal 120/80)    Past Medical History   Diagnosis Date   ??? Anxiety    ??? Depression    ??? DM (diabetes mellitus) (HCC)    ??? Hay fever    ??? Headache(784.0)    ??? Hyperlipidemia    ??? Hypertension    ??? Osteoarthritis    ??? Unspecified sleep apnea    ??? Urinary incontinence       Past Surgical History   Procedure Laterality Date   ??? Carpal tunnel release Right 1983   ??? Knee cartilage surgery Right 1973   ??? Cyst removal Right      x2   ??? Hysterectomy  1991   ??? Hernia repair Left 1990     femoral   ??? Neuroma surgery Left 1990     hernia site   ??? Breast lumpectomy Right    ??? Cardiac catheterization  2005,2010   ??? Knee arthroplasty Left    ??? Total knee arthroplasty Left 2013       Family History   Problem Relation Age of Onset   ??? Osteoporosis Mother    ??? Arthritis Mother    ??? Stroke Mother        Social History   Substance Use Topics   ??? Smoking status: Never Smoker   ??? Smokeless  tobacco: Never Used   ??? Alcohol use Yes      Current Outpatient Prescriptions   Medication Sig Dispense Refill   ??? citalopram (CELEXA) 40 MG tablet Take 1 tablet by mouth daily 30 tablet 5   ??? LORazepam (ATIVAN) 1 MG tablet Take 1 tablet by mouth as needed for Anxiety 60 tablet 1   ??? valsartan (DIOVAN) 80 MG tablet Take 1 tablet by mouth daily 90 tablet 3   ??? pravastatin (PRAVACHOL) 40 MG tablet Take 1 tablet by mouth every evening 90 tablet 3   ??? amLODIPine (NORVASC) 5 MG tablet TAKE 1 TABLET BY  MOUTH EVERY DAY 90 tablet 0   ??? pantoprazole (PROTONIX) 40 MG tablet Take 1 tablet by mouth 2 times daily 60 tablet 3   ??? sucralfate (CARAFATE) 1 GM tablet Take 1 tablet by mouth 4 times daily 120 tablet 3   ??? dicyclomine (BENTYL) 10 MG capsule Take 1 capsule by mouth 4 times daily (before meals and nightly) 120 capsule 3   ??? levothyroxine (LEVOTHROID) 150 MCG tablet Take 1 tablet by mouth daily 90 tablet 3   ??? gabapentin (NEURONTIN) 100 MG capsule Take 1 capsule by mouth 3 times daily 270 capsule 3   ??? vitamin D (CHOLECALCIFEROL) 1000 UNITS TABS tablet Take 1 tablet by mouth daily (Patient taking differently: Take 3,000 Units by mouth daily ) 30 tablet 5   ??? metFORMIN (GLUCOPHAGE) 500 MG tablet TAKE 1 TABLET BY MOUTH EVERY DAY WITH BREAKFAST 90 tablet 3   ??? HYDROcodone-acetaminophen (NORCO) 5-325 MG per tablet   0   ??? aspirin 81 MG tablet Take 81 mg by mouth daily.       No current facility-administered medications for this visit.      Allergies   Allergen Reactions   ??? Ciprofloxacin Itching     IV cipro only   ??? Demerol Hcl [Meperidine]    ??? Tetanus Toxoids        Health Maintenance   Topic Date Due   ??? Colon cancer screen colonoscopy  03/26/1998   ??? PNEUMO VAC >= 65 (2 of 2 - PPSV23) 04/28/2014   ??? Diabetic foot exam  02/01/2015   ??? Hepatitis C screen  05/30/2016 (Originally [birth year])   ??? Diabetic retinal exam  10/29/2015   ??? Diabetic hemoglobin A1C test  02/25/2016   ??? Diabetic microalbuminuria test  05/30/2016   ??? Lipid screen  08/27/2016   ??? Breast cancer screen  09/06/2016   ??? Zostavax vaccine  Addressed   ??? DEXA (modify frequency per FRAX score)  Addressed   ??? Flu vaccine  Completed       Subjective:      Review of Systems   Gastrointestinal: Positive for abdominal pain and diarrhea (4-5 times day, some stool to liquid appearance).   Genitourinary: Negative for dysuria and frequency.   Neurological: Positive for headaches.       Objective:     Physical Exam   Constitutional: She is oriented to person,  place, and time. She appears well-developed and well-nourished.   HENT:   Head: Normocephalic and atraumatic.   Right Ear: Tympanic membrane is bulging. Tympanic membrane is not injected.   Left Ear: Tympanic membrane is bulging. Tympanic membrane is not injected.   Nose: Rhinorrhea present. Right sinus exhibits no maxillary sinus tenderness and no frontal sinus tenderness. Left sinus exhibits no maxillary sinus tenderness and no frontal sinus tenderness.   Mouth/Throat: Uvula is midline and mucous membranes  are normal. Posterior oropharyngeal erythema present. No oropharyngeal exudate or posterior oropharyngeal edema.   Eyes: Conjunctivae are normal. Pupils are equal, round, and reactive to light.   Neck: Normal range of motion. Neck supple.   Cardiovascular: Normal rate, regular rhythm and normal heart sounds.    No murmur heard.  Pulmonary/Chest: Effort normal and breath sounds normal.   Abdominal: Soft. Bowel sounds are normal. There is tenderness (RUQ).   Musculoskeletal: Normal range of motion. She exhibits no edema.   Neurological: She is alert and oriented to person, place, and time. No cranial nerve deficit. Coordination normal.   Skin: Skin is warm and dry.   Psychiatric: She has a normal mood and affect. Her behavior is normal.   Nursing note and vitals reviewed.    Visit Vitals   ??? BP 130/72   ??? Pulse 78   ??? Resp 14   ??? Ht 5\' 6"  (1.676 m)   ??? Wt 265 lb (120.2 kg)   ??? BMI 42.77 kg/m2       Assessment:      1. Acute non intractable tension-type headache  MRI Brain WO Contrast   2. Posterior reversible encephalopathy syndrome  MRI Brain WO Contrast   3. Anxiety  LORazepam (ATIVAN) 1 MG tablet   4. RUQ abdominal pain  US Gallbladder Ruq   5. Diarrhea, unspecified type  US Gallbladder Ruq             Plan:      Return in about 2 months (around 12/03/2015).    Orders Placed This Encounter   Procedures   ??? MRI Brain WO Contrast     Standing Status:   Future     Standing Expiration Date:   10/01/2016     Scheduling  Instructions:      [facility] (open mri)     Order Specific Question:   Reason for exam:     Answer:   persistant headache, hx PRES   ??? US Gallbladder Ruq     Standing Status:   Future     Standing Expiration Date:   10/01/2016     Scheduling Instructions:      [facility]     Order Specific Question:   Reason for exam:     Answer:   RUQ Abd pain     Orders Placed This Encounter   Medications   ??? citalopram (CELEXA) 40 MG tablet     Sig: Take 1 tablet by mouth daily     Dispense:  30 tablet     Refill:  5   ??? LORazepam (ATIVAN) 1 MG tablet     Sig: Take 1 tablet by mouth as needed for Anxiety     Dispense:  60 tablet     Refill:  1      Increase celexa to 40 mg  Daily  Refill ativan, uses sparingly, for bad moments  Gall bladder usn - if neg will check HIDA  Could be IBS with her ongoing stress  Will recheck MRI brain due to headaches and hx of PRES- will need open MRI  Emotional support provided  Call w questions  F/u 2 mos     Patient given educational materials - see patient instructions.  Discussed use, benefit, and side effects of prescribed medications.  All patient questions answered. Pt voiced understanding. Reviewed health maintenance.  Instructed to continue current medications, diet and exercise.  Patient agreed with treatment plan. Follow up as directed.  Electronically signed by Laurena Slimmer, NP on 10/02/2015 at 10:45 PM

## 2015-10-04 ENCOUNTER — Encounter: Attending: Internal Medicine | Primary: Family Medicine

## 2015-10-10 ENCOUNTER — Telehealth

## 2015-10-10 NOTE — Telephone Encounter (Signed)
Pt called and [facility] Pt Ativan but since Pt is getting this filled in [place], it cannot be a [facility] Signature, but a doctor's. Can you do a script for this ?

## 2015-10-11 ENCOUNTER — Encounter

## 2015-10-11 MED ORDER — LORAZEPAM 1 MG PO TABS
1 | ORAL_TABLET | ORAL | 1 refills | Status: DC | PRN
Start: 2015-10-11 — End: 2019-11-22

## 2015-10-11 NOTE — Telephone Encounter (Signed)
Pt called again asking for the script to be changed, also asking for MRI Results. Writer told Pt  MRI had not been reviewed yet and Dr. Carolee Rotarlop has not redone script.

## 2015-10-11 NOTE — Telephone Encounter (Signed)
Ativan ordered. MRI showed some new changes consistent with inflammation or small vessel ischemia. Not sure if result of prior PRES. Suggest neuro follow up.

## 2015-10-12 ENCOUNTER — Encounter

## 2015-10-12 NOTE — Telephone Encounter (Signed)
Pt is requesting a phone call at your earliest convenience.  Pt is very scared and nervous

## 2015-10-12 NOTE — Telephone Encounter (Signed)
Pt was unavailable, lmovm for pt to call the office back.

## 2015-10-12 NOTE — Telephone Encounter (Signed)
Pt is agreeable to a neuro f/u.  Please start referral

## 2015-10-16 NOTE — Telephone Encounter (Signed)
-----   Message from Laurena Slimmerheryl L Jeffers, NP sent at 10/13/2015  7:10 PM EST -----  Normal gallbladder usn, if cont symptoms can do HIDA scan, does show fatty liver, could/can cause pain at times- advise low fat low chol diet

## 2015-10-16 NOTE — Telephone Encounter (Signed)
Pt would like to go ahead with the HIDA scan because she is still having the sx.  She also wanted to let you know that she has an appt at Dr Marcheta GrammesAuberle's office on Thursday.

## 2015-10-16 NOTE — Telephone Encounter (Signed)
HIDA ordered, ok to send to [facility] or [facility], her preference, she needs [facility] for testing

## 2015-10-17 NOTE — Telephone Encounter (Signed)
Pt would like to go ahead with the HIDA.  She is still having sx.

## 2015-10-17 NOTE — Telephone Encounter (Signed)
I ordered it yesterday (mon)

## 2015-10-18 NOTE — Telephone Encounter (Signed)
Pt aware of order  Order faxed

## 2015-10-18 NOTE — Telephone Encounter (Signed)
pts HIDA scan faxed to [facility]

## 2015-11-02 MED ORDER — PANTOPRAZOLE SODIUM 40 MG PO TBEC
40 MG | ORAL_TABLET | Freq: Two times a day (BID) | ORAL | 3 refills | Status: DC
Start: 2015-11-02 — End: 2020-03-17

## 2015-11-02 MED ORDER — CITALOPRAM HYDROBROMIDE 40 MG PO TABS
40 MG | ORAL_TABLET | Freq: Every day | ORAL | 3 refills | Status: DC
Start: 2015-11-02 — End: 2020-03-16

## 2015-11-10 ENCOUNTER — Ambulatory Visit (HOSPITAL_COMMUNITY)
Admission: RE | Admit: 2015-11-10 | Discharge: 2015-11-10 | Disposition: A | Discharge status: Home / Self Care | Payer: Medicare Other | Source: Ambulatory Visit | Attending: Urology | Admitting: Urology

## 2015-11-10 ENCOUNTER — Other Ambulatory Visit (HOSPITAL_COMMUNITY): Payer: Self-pay | Admitting: Urology

## 2015-11-10 DIAGNOSIS — C649 Malignant neoplasm of unspecified kidney, except renal pelvis: Secondary | ICD-10-CM | POA: Insufficient documentation

## 2015-11-27 MED ORDER — FLUCONAZOLE 100 MG PO TABS
100 MG | ORAL_TABLET | Freq: Every day | ORAL | 0 refills | Status: AC
Start: 2015-11-27 — End: 2015-12-02

## 2015-11-27 NOTE — Telephone Encounter (Signed)
Pt called and said she has an uncomfortable itch in he "private area". Pt wants to know if Diflucan can be called in for her.

## 2015-11-27 NOTE — Telephone Encounter (Signed)
Ok will send diflucan

## 2015-11-27 NOTE — Telephone Encounter (Signed)
Pt notified

## 2015-12-04 ENCOUNTER — Encounter: Attending: Internal Medicine | Primary: Family Medicine

## 2015-12-06 ENCOUNTER — Encounter: Attending: Registered Nurse | Primary: Family Medicine

## 2015-12-06 ENCOUNTER — Encounter: Attending: Internal Medicine | Primary: Family Medicine

## 2015-12-18 ENCOUNTER — Ambulatory Visit: Admit: 2015-12-18 | Discharge: 2015-12-18 | Payer: MEDICARE | Attending: Internal Medicine | Primary: Family Medicine

## 2015-12-18 DIAGNOSIS — H66001 Acute suppurative otitis media without spontaneous rupture of ear drum, right ear: Secondary | ICD-10-CM

## 2015-12-18 MED ORDER — LEVOFLOXACIN 500 MG PO TABS
500 MG | ORAL_TABLET | Freq: Every day | ORAL | 0 refills | Status: AC
Start: 2015-12-18 — End: 2015-12-28

## 2015-12-18 MED ORDER — GUAIFENESIN-CODEINE 100-10 MG/5ML PO SYRP
100-10 MG/5ML | Freq: Two times a day (BID) | ORAL | 0 refills | Status: AC | PRN
Start: 2015-12-18 — End: 2015-12-25

## 2015-12-18 MED ORDER — CIPROFLOXACIN-DEXAMETHASONE 0.3-0.1 % OT SUSP
Freq: Two times a day (BID) | OTIC | 0 refills | Status: DC
Start: 2015-12-18 — End: 2019-11-22

## 2015-12-18 NOTE — Progress Notes (Signed)
[facility]  [address]  [address]  [place] [address]  Dept: [phone]  Dept Fax: [phone]    Brandy [name] is a 70 y.o. female who presents today for her medical conditions/complaints as noted below.  Brandy [name] is c/o of   Chief Complaint   Patient presents with   ??? Pharyngitis   ??? Otalgia     R ear   ??? Depression   ??? Cough     non productive   ??? Headache     Chronic Disease Visit Information    BP Readings from Last 3 Encounters:   12/18/15 110/70   10/02/15 130/72   09/06/15 140/78          Hemoglobin A1C (%)   Date Value   08/28/2015 4.1   02/20/2015 7.2   07/21/2014 7.7     A1c (%)   Date Value   12/14/2010 7.0 (H)     Microalb/Crt. Ratio (mcg/mg creat)   Date Value   05/31/2013 11     LDL Cholesterol (mg/dL)   Date Value   65/78/4696 120     LDL Calculated (mg/dL)   Date Value   29/52/8413 129     HDL (mg/dL)   Date Value   24/40/1027 35     BUN (mg/dL)   Date Value   25/36/6440 10     Creatinine (mg/dL)   Date Value   34/74/2595 0.70     CREATININE (no units)   Date Value   02/24/2015 0.75     Glucose (mg/dL)   Date Value   63/87/5643 158   12/14/2010 165 (H)            Have you changed or started any medications since your last visit including any over-the-counter medicines, vitamins, or herbal medicines? no   Are you having any side effects from any of your medications? -  no  Have you stopped taking any of your medications? Is so, why? -  no    Have you seen any other physician or provider since your last visit? No  Have you had any other diagnostic tests since your last visit? No  Have you been seen in the emergency room and/or had an admission to a hospital since we last saw you? No  Have you had your annual diabetic retinal (eye) exam? Yes - Records Obtained  Have you had your routine dental cleaning in the past 6 months? no    Have you activated your MyChart account? If not, what are your barriers? Yes     Patient Care  Team:  Leanora Ivanoff, DO as PCP - General  Ike Bene, DO as Surgeon (Orthopedic Surgery)  Gena Fray, MD as Consulting Physician (Pulmonology)         Medical History Review  Past Medical, Family, and Social History reviewed and does not contribute to the patient presenting condition    Health Maintenance   Topic Date Due   ??? Colon cancer screen colonoscopy  03/26/1998   ??? Pneumococcal low/med risk (1 of 2 - PCV13) 03/26/2013   ??? Diabetic foot exam  02/01/2015   ??? Diabetic retinal exam  10/29/2015   ??? Hepatitis C screen  05/30/2016 (Originally Jul 11, 1946)   ??? Diabetic hemoglobin A1C test  02/25/2016   ??? Diabetic microalbuminuria test  05/30/2016   ??? Lipid screen  08/27/2016   ??? Breast cancer screen  09/27/2017   ???  Zostavax vaccine  Addressed   ??? DEXA (modify frequency per FRAX score)  Addressed   ??? Flu vaccine  Completed       HPI:     HPI Comments: Started with sore throat, sinus drainage, initially Right ear, now Left ear hurts  Having cough, using tylenol cough and cold    Otalgia    There is pain in both ears. This is a new problem. The current episode started in the past 7 days. The problem occurs every few hours. The problem has been unchanged. There has been no fever. The pain is moderate. Associated symptoms include coughing, headaches, rhinorrhea and a sore throat. Pertinent negatives include no diarrhea or rash. She has tried acetaminophen for the symptoms. The treatment provided mild relief. There is no history of a chronic ear infection.       Hemoglobin A1C (%)   Date Value   08/28/2015 4.1   02/20/2015 7.2   07/21/2014 7.7     A1c (%)   Date Value   12/14/2010 7.0 (H)             ( goal A1C is < 7)   Microalb/Crt. Ratio (mcg/mg creat)   Date Value   05/31/2013 11     LDL Cholesterol (mg/dL)   Date Value   16/10/930 120   07/15/2012 139 (H)   12/14/2010 129 (H)     LDL Calculated (mg/dL)   Date Value   35/57/3220 129   07/21/2014 138       (goal LDL is <100)   AST (U/L)   Date Value    02/24/2015 19     ALT (U/L)   Date Value   02/24/2015 19     BUN (mg/dL)   Date Value   25/42/7062 10     BP Readings from Last 3 Encounters:   12/18/15 110/70   10/02/15 130/72   09/06/15 140/78          (goal 120/80)    Past Medical History   Diagnosis Date   ??? Anxiety    ??? Depression    ??? DM (diabetes mellitus) (HCC)    ??? Hay fever    ??? Headache(784.0)    ??? Hyperlipidemia    ??? Hypertension    ??? Osteoarthritis    ??? Unspecified sleep apnea    ??? Urinary incontinence       Past Surgical History   Procedure Laterality Date   ??? Carpal tunnel release Right 1983   ??? Knee cartilage surgery Right 1973   ??? Cyst removal Right      x2   ??? Hysterectomy  1991   ??? Hernia repair Left 1990     femoral   ??? Neuroma surgery Left 1990     hernia site   ??? Breast lumpectomy Right    ??? Cardiac catheterization  2005,2010   ??? Knee arthroplasty Left    ??? Total knee arthroplasty Left 2013       Family History   Problem Relation Age of Onset   ??? Osteoporosis Mother    ??? Arthritis Mother    ??? Stroke Mother        Social History   Substance Use Topics   ??? Smoking status: Never Smoker   ??? Smokeless tobacco: Never Used   ??? Alcohol use Yes      Current Outpatient Prescriptions   Medication Sig Dispense Refill   ??? gabapentin (NEURONTIN) 300 MG capsule Take 300 mg  by mouth 2 times daily     ??? levofloxacin (LEVAQUIN) 500 MG tablet Take 1 tablet by mouth daily for 10 days 10 tablet 0   ??? ciprofloxacin-dexamethasone (CIPRODEX) 0.3-0.1 % otic suspension Place 4 drops into both ears 2 times daily 1 Bottle 0   ??? guaiFENesin-codeine (TUSSI-ORGANIDIN NR) 100-10 MG/5ML syrup Take 5 mLs by mouth 2 times daily as needed for Cough 50 mL 0   ??? citalopram (CELEXA) 40 MG tablet Take 1 tablet by mouth daily 180 tablet 3   ??? pantoprazole (PROTONIX) 40 MG tablet Take 1 tablet by mouth 2 times daily 60 tablet 3   ??? LORazepam (ATIVAN) 1 MG tablet Take 1 tablet by mouth as needed for Anxiety 60 tablet 1   ??? valsartan (DIOVAN) 80 MG tablet Take 1 tablet by mouth daily  90 tablet 3   ??? pravastatin (PRAVACHOL) 40 MG tablet Take 1 tablet by mouth every evening 90 tablet 3   ??? amLODIPine (NORVASC) 5 MG tablet TAKE 1 TABLET BY MOUTH EVERY DAY 90 tablet 0   ??? sucralfate (CARAFATE) 1 GM tablet Take 1 tablet by mouth 4 times daily 120 tablet 3   ??? dicyclomine (BENTYL) 10 MG capsule Take 1 capsule by mouth 4 times daily (before meals and nightly) 120 capsule 3   ??? levothyroxine (LEVOTHROID) 150 MCG tablet Take 1 tablet by mouth daily 90 tablet 3   ??? gabapentin (NEURONTIN) 100 MG capsule Take 1 capsule by mouth 3 times daily (Patient taking differently: Take 100 mg by mouth daily ) 270 capsule 3   ??? vitamin D (CHOLECALCIFEROL) 1000 UNITS TABS tablet Take 1 tablet by mouth daily (Patient taking differently: Take 3,000 Units by mouth daily ) 30 tablet 5   ??? metFORMIN (GLUCOPHAGE) 500 MG tablet TAKE 1 TABLET BY MOUTH EVERY DAY WITH BREAKFAST 90 tablet 3   ??? HYDROcodone-acetaminophen (NORCO) 5-325 MG per tablet   0   ??? aspirin 81 MG tablet Take 81 mg by mouth daily.       No current facility-administered medications for this visit.      Allergies   Allergen Reactions   ??? Ciprofloxacin Itching     IV cipro only   ??? Demerol Hcl [Meperidine]    ??? Tetanus Toxoids        Health Maintenance   Topic Date Due   ??? Colon cancer screen colonoscopy  03/26/1998   ??? Pneumococcal low/med risk (1 of 2 - PCV13) 03/26/2013   ??? Diabetic foot exam  02/01/2015   ??? Diabetic retinal exam  10/29/2015   ??? Hepatitis C screen  05/30/2016 (Originally [birth year])   ??? Diabetic hemoglobin A1C test  02/25/2016   ??? Diabetic microalbuminuria test  05/30/2016   ??? Lipid screen  08/27/2016   ??? Breast cancer screen  09/27/2017   ??? Zostavax vaccine  Addressed   ??? DEXA (modify frequency per FRAX score)  Addressed   ??? Flu vaccine  Completed       Subjective:      Review of Systems   Constitutional: Negative for fatigue and fever.   HENT: Positive for ear pain, rhinorrhea and sore throat.    Eyes: Negative for photophobia and visual  disturbance.   Respiratory: Positive for cough. Negative for shortness of breath.    Cardiovascular: Negative for chest pain, palpitations and leg swelling.   Gastrointestinal: Negative for constipation and diarrhea.   Endocrine: Negative for polydipsia and polyuria.   Genitourinary: Negative for dysuria and  urgency.   Musculoskeletal: Negative for arthralgias and myalgias.   Skin: Negative for rash and wound.   Allergic/Immunologic: Negative for environmental allergies and immunocompromised state.   Neurological: Positive for headaches. Negative for dizziness and light-headedness.   Hematological: Negative for adenopathy. Does not bruise/bleed easily.   Psychiatric/Behavioral: Negative for sleep disturbance. The patient is not nervous/anxious.        Objective:     Physical Exam   Constitutional: She is oriented to person, place, and time. She appears well-developed and well-nourished.   HENT:   Head: Normocephalic and atraumatic.   Right Ear: Tympanic membrane is injected and bulging.   Left Ear: Tympanic membrane is bulging. Tympanic membrane is not injected.   Nose: Mucosal edema and rhinorrhea present. Right sinus exhibits frontal sinus tenderness. Left sinus exhibits frontal sinus tenderness.   Mouth/Throat: Uvula is midline and mucous membranes are normal. Posterior oropharyngeal edema and posterior oropharyngeal erythema present.   Eyes: Conjunctivae are normal. Pupils are equal, round, and reactive to light.   Neck: Normal range of motion. Neck supple.   Cardiovascular: Normal rate, regular rhythm and normal heart sounds.    No murmur heard.  Pulmonary/Chest: Effort normal and breath sounds normal.   Abdominal: Soft. Bowel sounds are normal.   Musculoskeletal: Normal range of motion. She exhibits no edema.   Lymphadenopathy:        Head (right side): Tonsillar and posterior auricular adenopathy present.        Head (left side): Tonsillar and posterior auricular adenopathy present.     She has no cervical  adenopathy.   Neurological: She is alert and oriented to person, place, and time. No cranial nerve deficit.   Skin: Skin is warm and dry.   Psychiatric: She has a normal mood and affect. Her behavior is normal.   Nursing note and vitals reviewed.    Visit Vitals   ??? BP 110/70   ??? Pulse 78   ??? Resp 14   ??? Ht 5' 6.5" (1.689 m)   ??? Wt 262 lb (118.8 kg)   ??? BMI 41.65 kg/m2       Assessment:      1. Acute suppurative otitis media of right ear without spontaneous rupture of tympanic membrane, recurrence not specified     2. Acute non-recurrent frontal sinusitis     3. Acute bronchitis, unspecified organism               Plan:      Return if symptoms worsen or fail to improve.    No orders of the defined types were placed in this encounter.    Orders Placed This Encounter   Medications   ??? levofloxacin (LEVAQUIN) 500 MG tablet     Sig: Take 1 tablet by mouth daily for 10 days     Dispense:  10 tablet     Refill:  0   ??? ciprofloxacin-dexamethasone (CIPRODEX) 0.3-0.1 % otic suspension     Sig: Place 4 drops into both ears 2 times daily     Dispense:  1 Bottle     Refill:  0   ??? guaiFENesin-codeine (TUSSI-ORGANIDIN NR) 100-10 MG/5ML syrup     Sig: Take 5 mLs by mouth 2 times daily as needed for Cough     Dispense:  50 mL     Refill:  0      levaquin daily x 10 days  ciprodex bid  Guaf/codeine susp  Increased fluids  Call w questions/concerns  F/u prn     Patient given educational materials - see patient instructions.  Discussed use, benefit, and side effects of prescribed medications.  All patient questions answered. Pt voiced understanding. Reviewed health maintenance.  Instructed to continue current medications, diet and exercise.  Patient agreed with treatment plan. Follow up as directed.     Electronically signed by Laurena Slimmer, NP on 12/20/2015 at 8:31 AM

## 2015-12-27 ENCOUNTER — Other Ambulatory Visit: Payer: Self-pay | Admitting: Surgery

## 2015-12-27 DIAGNOSIS — E042 Nontoxic multinodular goiter: Secondary | ICD-10-CM

## 2016-01-01 ENCOUNTER — Ambulatory Visit
Admission: RE | Admit: 2016-01-01 | Discharge: 2016-01-01 | Disposition: A | Discharge status: Home / Self Care | Payer: Medicare Other | Source: Ambulatory Visit | Attending: Surgery | Admitting: Surgery

## 2016-01-01 DIAGNOSIS — E042 Nontoxic multinodular goiter: Secondary | ICD-10-CM

## 2016-01-02 MED ORDER — AMLODIPINE BESYLATE 5 MG PO TABS
5 MG | ORAL_TABLET | ORAL | 0 refills | Status: DC
Start: 2016-01-02 — End: 2016-04-01

## 2016-01-22 ENCOUNTER — Encounter

## 2016-01-23 MED ORDER — METFORMIN HCL 500 MG PO TABS
500 MG | ORAL_TABLET | ORAL | 0 refills | Status: DC
Start: 2016-01-23 — End: 2016-04-22

## 2016-01-23 NOTE — Telephone Encounter (Signed)
Labs ordered, can be done anytime- we can fax to where she would like them sent to, other option if normal labs would be derm eval, sent her message to mychart as well

## 2016-01-23 NOTE — Telephone Encounter (Signed)
From: Brandy CastillaPatricia A [name]  To: Laurena Slimmerheryl L Jeffers, NP  Sent: 01/22/2016 2:00 PM EDT  Subject: Non-Urgent Medical Question    Elnita Maxwellheryl, My hair is falling out again. I am taking 200 units of Vitamin D3 and using Rogaine. I am getting so discussed and I don't know what to do. Would it be my thyroid again? If you want me in the office that is okay too. Have the girls let me know.      Thanks    PJ

## 2016-01-25 LAB — HEMOGLOBIN A1C: Hemoglobin A1C: 6.9 %

## 2016-01-25 LAB — TSH: TSH: 1.44 u[IU]/mL

## 2016-01-25 LAB — IRON AND TIBC
Iron: 63
TIBC: 405

## 2016-01-25 LAB — RETICULOCYTES: Retic: 1.4

## 2016-01-25 LAB — T4, FREE: T4 Free: 1.05

## 2016-01-30 ENCOUNTER — Encounter

## 2016-01-30 NOTE — Telephone Encounter (Signed)
This writer left detailed message regarding results as charted by provider. To call back with any questions.

## 2016-01-30 NOTE — Telephone Encounter (Signed)
-----   Message from Laurena Slimmerheryl L Jeffers, NP sent at 01/30/2016  4:40 PM EDT -----  Normal labs, but a1c is creeping upwards (6.9) no changes at this time, monitor diet

## 2016-02-01 ENCOUNTER — Encounter

## 2016-02-02 NOTE — Telephone Encounter (Signed)
-----   Message from Laurena Slimmerheryl L Jeffers, NP sent at 02/01/2016  8:32 PM EDT -----  Normal vit d level

## 2016-03-01 NOTE — Telephone Encounter (Signed)
Error

## 2016-04-01 MED ORDER — AMLODIPINE BESYLATE 5 MG PO TABS
5 MG | ORAL_TABLET | ORAL | 0 refills | Status: DC
Start: 2016-04-01 — End: 2016-07-08

## 2016-04-01 NOTE — Telephone Encounter (Signed)
Last OV 12/18/2015          Health Maintenance   Topic Date Due   ??? Colon cancer screen colonoscopy  03/26/1998   ??? Pneumococcal low/med risk (1 of 2 - PCV13) 03/26/2013   ??? Diabetic foot exam  02/01/2015   ??? Diabetic retinal exam  10/29/2015   ??? Hepatitis C screen  05/30/2016 (Originally [birth year])   ??? Diabetic microalbuminuria test  05/30/2016   ??? Diabetic hemoglobin A1C test  07/27/2016   ??? Lipid screen  08/27/2016   ??? Breast cancer screen  09/27/2017   ??? Zostavax vaccine  Addressed   ??? DEXA (modify frequency per FRAX score)  Addressed   ??? Flu vaccine  Completed             (applicable per patient's age: Cancer Screenings, Depression Screening, Fall Risk Screening, Immunizations)    Hemoglobin A1C (%)   Date Value   01/25/2016 6.9   08/28/2015 4.1   02/20/2015 7.2     Microalb/Crt. Ratio (mcg/mg creat)   Date Value   05/31/2013 11     LDL Cholesterol (mg/dL)   Date Value   09/81/191408/01/2013 120     LDL Calculated (mg/dL)   Date Value   78/29/562110/31/2016 129     AST (U/L)   Date Value   02/24/2015 19     ALT (U/L)   Date Value   02/24/2015 19     BUN (mg/dL)   Date Value   30/86/578404/29/2016 10      (goal A1C is < 7)   (goal LDL is <100) need 30-50% reduction from baseline     BP Readings from Last 3 Encounters:   12/18/15 110/70   10/02/15 130/72   09/06/15 140/78    (goal BP 120/80)      All Future Testing planned in [facility]:  Lab Frequency Next Occurrence   NM Hepatobiliary Scan With Pharm Agent Once 10/03/2016       Next Visit Date:  Future Appointments  Date Time Provider Department Center   04/09/2016 2:15 PM Laurena Slimmerheryl L Jeffers, NP [id number]            Patient Active Problem List:     Hay fever     DM (diabetes mellitus) (HCC)     Depression     Urinary incontinence     Hyperlipidemia     HTN (hypertension)     Anxiety     Generalized headaches     OA (osteoarthritis)

## 2016-04-09 ENCOUNTER — Ambulatory Visit: Admit: 2016-04-09 | Discharge: 2016-04-09 | Payer: MEDICARE | Attending: Internal Medicine | Primary: Family Medicine

## 2016-04-09 DIAGNOSIS — R071 Chest pain on breathing: Secondary | ICD-10-CM

## 2016-04-09 MED ORDER — BACLOFEN 10 MG PO TABS
10 MG | ORAL_TABLET | ORAL | 0 refills | Status: DC
Start: 2016-04-09 — End: 2019-11-22

## 2016-04-09 MED ORDER — BACLOFEN 10 MG PO TABS
10 MG | ORAL_TABLET | Freq: Every evening | ORAL | 0 refills | Status: DC | PRN
Start: 2016-04-09 — End: 2016-04-09

## 2016-04-09 MED ORDER — VENLAFAXINE HCL ER 37.5 MG PO CP24
37.5 MG | ORAL_CAPSULE | ORAL | 3 refills | Status: DC
Start: 2016-04-09 — End: 2019-11-22

## 2016-04-09 MED ORDER — VENLAFAXINE HCL ER 37.5 MG PO CP24
37.5 MG | ORAL_CAPSULE | Freq: Every day | ORAL | 3 refills | Status: DC
Start: 2016-04-09 — End: 2016-04-09

## 2016-04-09 NOTE — Progress Notes (Signed)
[facility] PHYSICIANS  [facility] INTERNAL MEDICINE  [address]  [address]  [place] [address]  Dept: [phone]  Dept Fax: [phone]    Brandy [name] is a 70 y.o. female who presents today for her medical conditions/complaints as noted below.  [name] is c/o of   Chief Complaint   Patient presents with   ??? Back Pain     upper right side of back x 2-3 months   ??? Chest Congestion     x 2-3 days cough,tired   ??? Chest Pain     yesterday when she got upset      Visit Information    Have you changed or started any medications since your last visit including any over-the-counter medicines, vitamins, or herbal medicines? no   Are you having any side effects from any of your medications? -  no  Have you stopped taking any of your medications? Is so, why? -  no    Have you seen any other physician or provider since your last visit? Yes - Records Obtained  Have you had any other diagnostic tests since your last visit? No  Have you been seen in the emergency room and/or had an admission to a hospital since we last saw you? No  Have you had your routine dental cleaning in the past 6 months? yes - 11/2015    Have you activated your MyChart account? If not, what are your barriers? Yes     Patient Care Team:  Leanora Ivanoff, DO as PCP - General  Ike Bene, DO as Surgeon (Orthopedic Surgery)  Gena Fray, MD as Consulting Physician (Pulmonology)    Medical History Review  Past Medical, Family, and Social History reviewed and does not contribute to the patient presenting condition    Health Maintenance   Topic Date Due   ??? Colon cancer screen colonoscopy  03/26/1998   ??? Diabetic foot exam  02/01/2015   ??? Diabetic retinal exam  10/29/2015   ??? Hepatitis C screen  05/30/2016 (Originally [birth year])   ??? Diabetic microalbuminuria test  05/30/2016   ??? Diabetic hemoglobin A1C test  07/27/2016   ??? Lipid screen  08/27/2016   ??? Pneumococcal low/med risk (2 of 2 - PPSV23) 04/09/2017   ??? Breast cancer screen   09/27/2017   ??? Zostavax vaccine  Addressed   ??? DEXA (modify frequency per FRAX score)  Addressed   ??? Flu vaccine  Completed       HPI:     HPI Comments: Has been having chest pain on/off x 2 weeks  Worse when lying down  Developed sharp mid R thoracic back pain- hx AAA  Some increased stress at home lately with her daughter    Chest Pain    This is a recurrent problem. The current episode started 1 to 4 weeks ago (2 weeks, but worsened last night). The onset quality is sudden. The problem occurs daily (worse when lying flat). The problem has been waxing and waning. The pain is present in the substernal region. The pain is at a severity of 8/10. The pain is moderate. The quality of the pain is described as squeezing, sharp and tightness. The pain radiates to the mid back. Associated symptoms include back pain (sharp mid thoracic area), lower extremity edema, malaise/fatigue and shortness of breath. Pertinent negatives include no abdominal pain, cough, dizziness, fever, headaches, irregular heartbeat, nausea, near-syncope, palpitations or syncope. The pain is aggravated by emotional upset (  worse when lying flat). She has tried nothing for the symptoms. The treatment provided no relief. Risk factors include lack of exercise, obesity and post-menopausal.   Her past medical history is significant for aneurysm, anxiety/panic attacks, hyperlipidemia and hypertension.   Pertinent negatives for past medical history include no aortic dissection, no CAD, no cancer, no COPD and no DVT.       Hemoglobin A1C (%)   Date Value   01/25/2016 6.9   08/28/2015 4.1   02/20/2015 7.2             ( goal A1C is < 7)   Microalb/Crt. Ratio (mcg/mg creat)   Date Value   05/31/2013 11     LDL Cholesterol (mg/dL)   Date Value   30/86/5784 120   07/15/2012 139 (H)   12/14/2010 129 (H)     LDL Calculated (mg/dL)   Date Value   69/62/9528 129   07/21/2014 138       (goal LDL is <100)   AST (U/L)   Date Value   02/24/2015 19     ALT (U/L)   Date  Value   02/24/2015 19     BUN (mg/dL)   Date Value   41/32/4401 10     BP Readings from Last 3 Encounters:   04/09/16 112/72   12/18/15 110/70   10/02/15 130/72          (goal 120/80)    Past Medical History:   Diagnosis Date   ??? Anxiety    ??? Depression    ??? DM (diabetes mellitus) (HCC)    ??? Hay fever    ??? Headache(784.0)    ??? Hyperlipidemia    ??? Hypertension    ??? Osteoarthritis    ??? Unspecified sleep apnea    ??? Urinary incontinence       Past Surgical History:   Procedure Laterality Date   ??? BREAST LUMPECTOMY Right    ??? CARDIAC CATHETERIZATION  2005,2010   ??? CARPAL TUNNEL RELEASE Right 1983   ??? CYST REMOVAL Right     x2   ??? HERNIA REPAIR Left 1990    femoral   ??? HYSTERECTOMY  1991   ??? KNEE ARTHROPLASTY Left    ??? KNEE CARTILAGE SURGERY Right 1973   ??? NEUROMA SURGERY Left 1990    hernia site   ??? TOTAL KNEE ARTHROPLASTY Left 2013       Family History   Problem Relation Age of Onset   ??? Osteoporosis Mother    ??? Arthritis Mother    ??? Stroke Mother        Social History   Substance Use Topics   ??? Smoking status: Never Smoker   ??? Smokeless tobacco: Never Used   ??? Alcohol use Yes      Current Outpatient Prescriptions   Medication Sig Dispense Refill   ??? amLODIPine (NORVASC) 5 MG tablet TAKE 1 TABLET BY MOUTH EVERY DAY 90 tablet 0   ??? metFORMIN (GLUCOPHAGE) 500 MG tablet TAKE 1 TABLET BY MOUTH EVERY DAY WITH BREAKFAST 90 tablet 0   ??? gabapentin (NEURONTIN) 300 MG capsule Take 300 mg by mouth 2 times daily     ??? ciprofloxacin-dexamethasone (CIPRODEX) 0.3-0.1 % otic suspension Place 4 drops into both ears 2 times daily 1 Bottle 0   ??? citalopram (CELEXA) 40 MG tablet Take 1 tablet by mouth daily 180 tablet 3   ??? pantoprazole (PROTONIX) 40 MG tablet Take 1 tablet by mouth 2 times  daily 60 tablet 3   ??? LORazepam (ATIVAN) 1 MG tablet Take 1 tablet by mouth as needed for Anxiety 60 tablet 1   ??? valsartan (DIOVAN) 80 MG tablet Take 1 tablet by mouth daily 90 tablet 3   ??? pravastatin (PRAVACHOL) 40 MG tablet Take 1 tablet by mouth  every evening 90 tablet 3   ??? sucralfate (CARAFATE) 1 GM tablet Take 1 tablet by mouth 4 times daily 120 tablet 3   ??? dicyclomine (BENTYL) 10 MG capsule Take 1 capsule by mouth 4 times daily (before meals and nightly) 120 capsule 3   ??? levothyroxine (LEVOTHROID) 150 MCG tablet Take 1 tablet by mouth daily 90 tablet 3   ??? gabapentin (NEURONTIN) 100 MG capsule Take 1 capsule by mouth 3 times daily (Patient taking differently: Take 100 mg by mouth daily ) 270 capsule 3   ??? vitamin D (CHOLECALCIFEROL) 1000 UNITS TABS tablet Take 1 tablet by mouth daily (Patient taking differently: Take 3,000 Units by mouth daily ) 30 tablet 5   ??? HYDROcodone-acetaminophen (NORCO) 5-325 MG per tablet   0   ??? aspirin 81 MG tablet Take 81 mg by mouth daily.     ??? baclofen (LIORESAL) 10 MG tablet TAKE 1 TABLET BY MOUTH EVERY NIGHT AS NEEDED FOR MUSCLE SPASMS 90 tablet 0   ??? venlafaxine (EFFEXOR XR) 37.5 MG extended release capsule TAKE 1 CAPSULE BY MOUTH DAILY 90 capsule 3     No current facility-administered medications for this visit.      Allergies   Allergen Reactions   ??? Ciprofloxacin Itching     IV cipro only   ??? Demerol Hcl [Meperidine]    ??? Tetanus Toxoids        Health Maintenance   Topic Date Due   ??? Colon cancer screen colonoscopy  03/26/1998   ??? Diabetic foot exam  02/01/2015   ??? Diabetic retinal exam  10/29/2015   ??? Hepatitis C screen  05/30/2016 (Originally 10/17/1946)   ??? Diabetic microalbuminuria test  05/30/2016   ??? Diabetic hemoglobin A1C test  07/27/2016   ??? Lipid screen  08/27/2016   ??? Pneumococcal low/med risk (2 of 2 - PPSV23) 04/09/2017   ??? Breast cancer screen  09/27/2017   ??? Zostavax vaccine  Addressed   ??? DEXA (modify frequency per FRAX score)  Addressed   ??? Flu vaccine  Completed       Subjective:      Review of Systems   Constitutional: Positive for malaise/fatigue. Negative for fatigue and fever.   HENT: Negative for rhinorrhea and sore throat.    Eyes: Negative for photophobia and visual disturbance.    Respiratory: Positive for shortness of breath. Negative for cough.    Cardiovascular: Positive for chest pain. Negative for palpitations, leg swelling, syncope and near-syncope.   Gastrointestinal: Negative for abdominal pain, constipation, diarrhea and nausea.   Endocrine: Negative for polydipsia and polyuria.   Genitourinary: Negative for dysuria and urgency.   Musculoskeletal: Positive for arthralgias and back pain (sharp mid thoracic area). Negative for myalgias.   Skin: Negative for rash and wound.   Allergic/Immunologic: Negative for environmental allergies and immunocompromised state.   Neurological: Negative for dizziness, light-headedness and headaches.   Hematological: Negative for adenopathy. Does not bruise/bleed easily.   Psychiatric/Behavioral: Negative for sleep disturbance. The patient is not nervous/anxious.        Objective:     Physical Exam   Constitutional: She is oriented to person, place, and time.  She appears well-developed and well-nourished. She appears distressed.   HENT:   Head: Normocephalic and atraumatic.   Eyes: Conjunctivae are normal. Pupils are equal, round, and reactive to light.   Neck: Normal range of motion. Neck supple.   Cardiovascular: Normal rate, regular rhythm, normal heart sounds and normal pulses.    No murmur heard.  Pulmonary/Chest: Effort normal and breath sounds normal.   Abdominal: Soft. Bowel sounds are normal.   Musculoskeletal: She exhibits no edema.        Thoracic back: She exhibits decreased range of motion, tenderness, swelling and pain.   Neurological: She is alert and oriented to person, place, and time. No cranial nerve deficit.   Skin: Skin is warm and dry.   Psychiatric: Her behavior is normal. Judgment normal. Her mood appears anxious. She exhibits a depressed mood.   Nursing note and vitals reviewed.    BP 112/72   Pulse 80   Resp 15   Ht 5\' 6"  (1.676 m)   Wt 267 lb 9.6 oz (121.4 kg)   BMI 43.19 kg/m2    Assessment:      1. Chest pain on breathing   EKG 12 Lead    Basic Metabolic Panel    CBC    Brain Natriuretic Peptide    CTA ABDOMINAL W CONTRAST    CTA CHEST WITH CONTRAST    CANCELED: CT CHEST W CONTRAST    CANCELED: CTA CHEST WITH CONTRAST   2. Shortness of breath  Basic Metabolic Panel    CBC    Brain Natriuretic Peptide    CTA CHEST WITH CONTRAST    CANCELED: CT CHEST W CONTRAST    CANCELED: CTA CHEST WITH CONTRAST   3. Acute right-sided thoracic back pain  Basic Metabolic Panel    CBC    CTA ABDOMINAL W CONTRAST    CTA CHEST WITH CONTRAST    CANCELED: CT CHEST W CONTRAST    CANCELED: CT ABDOMEN PELVIS W IV CONTRAST Additional Contrast? Radiologist Recommendation    CANCELED: CTA CHEST WITH CONTRAST   4. Abdominal aneurysm (HCC)  CTA ABDOMINAL W CONTRAST    CTA CHEST WITH CONTRAST    CANCELED: CT ABDOMEN PELVIS W IV CONTRAST Additional Contrast? Radiologist Recommendation    CANCELED: CTA CHEST WITH CONTRAST   5. Need for Streptococcus pneumoniae vaccination  Pneumococcal conjugate vaccine 13-valent   6. Pulmonary nodule  CANCELED: CTA CHEST WITH CONTRAST             Plan:      Return if symptoms worsen or fail to improve.    Orders Placed This Encounter   Procedures   ??? CTA ABDOMINAL W CONTRAST     Standing Status:   Future     Standing Expiration Date:   04/09/2017     Scheduling Instructions:      fh     Order Specific Question:   Reason for exam:     Answer:   abd pain, abd aneurysm   ??? CTA CHEST WITH CONTRAST     Standing Status:   Future     Standing Expiration Date:   04/09/2017     Order Specific Question:   Reason for exam:     Answer:   rule out pe, shortness of breath, back pain   ??? Pneumococcal conjugate vaccine 13-valent   ??? Basic Metabolic Panel     Standing Status:   Future     Standing Expiration Date:   04/09/2017   ???  CBC     Standing Status:   Future     Standing Expiration Date:   04/09/2017   ??? Brain Natriuretic Peptide     Standing Status:   Future     Standing Expiration Date:   04/09/2017   ??? EKG 12 Lead     Order Specific Question:    Reason for Exam?     Answer:   Chest pain     Orders Placed This Encounter   Medications   ??? DISCONTD: baclofen (LIORESAL) 10 MG tablet     Sig: Take 1 tablet by mouth nightly as needed (muscle spasms)     Dispense:  30 tablet     Refill:  0   ??? DISCONTD: venlafaxine (EFFEXOR XR) 37.5 MG extended release capsule     Sig: Take 1 capsule by mouth daily     Dispense:  30 capsule     Refill:  3      ekg now  CTA Chest/abd- concern for shortness of breath/chest pain/ and sharp back pain has prior hx of AAA- R/O PE and ABD aneurysm  CBC, CmP  prevnar today  Yesterday with episode of cp after talking to her daughter- who has been a source of stress for her-  Will add effexor to help with stress  Trial baclofen for back pain  Await CT scan results  Call if worsening symptoms/no improvements  F/u prn     Patient given educational materials - see patient instructions.  Discussed use, benefit, and side effects of prescribed medications.  All patient questions answered. Pt voiced understanding. Reviewed health maintenance.  Instructed to continue current medications, diet and exercise.  Patient agreed with treatment plan. Follow up as directed.     Electronically signed by Laurena Slimmer, NP on 04/09/2016 at 9:37 PM

## 2016-04-09 NOTE — Telephone Encounter (Signed)
Last OV 04/09/2016      Health Maintenance   Topic Date Due   ??? Colon cancer screen colonoscopy  03/26/1998   ??? Diabetic foot exam  02/01/2015   ??? Diabetic retinal exam  10/29/2015   ??? Hepatitis C screen  05/30/2016 (Originally 16-Sep-1946)   ??? Diabetic microalbuminuria test  05/30/2016   ??? Diabetic hemoglobin A1C test  07/27/2016   ??? Lipid screen  08/27/2016   ??? Pneumococcal low/med risk (2 of 2 - PPSV23) 04/09/2017   ??? Breast cancer screen  09/27/2017   ??? Zostavax vaccine  Addressed   ??? DEXA (modify frequency per FRAX score)  Addressed   ??? Flu vaccine  Completed             (applicable per patient's age: Cancer Screenings, Depression Screening, Fall Risk Screening, Immunizations)    Hemoglobin A1C (%)   Date Value   01/25/2016 6.9   08/28/2015 4.1   02/20/2015 7.2     Microalb/Crt. Ratio (mcg/mg creat)   Date Value   05/31/2013 11     LDL Cholesterol (mg/dL)   Date Value   16/10/960408/01/2013 120     LDL Calculated (mg/dL)   Date Value   54/09/811910/31/2016 129     AST (U/L)   Date Value   02/24/2015 19     ALT (U/L)   Date Value   02/24/2015 19     BUN (mg/dL)   Date Value   14/78/295604/29/2016 10      (goal A1C is < 7)   (goal LDL is <100) need 30-50% reduction from baseline     BP Readings from Last 3 Encounters:   04/09/16 112/72   12/18/15 110/70   10/02/15 130/72    (goal BP 120/80)      All Future Testing planned in [facility]:  Lab Frequency Next Occurrence   NM Hepatobiliary Scan With Pharm Agent Once 10/03/2016   Basic Metabolic Panel Once 04/09/2016   CBC Once 04/09/2016   Brain Natriuretic Peptide Once 04/09/2016   CTA ABDOMINAL W CONTRAST Once 04/09/2016   CTA CHEST WITH CONTRAST Once 04/09/2016       Next Visit Date:  No future appointments.         Patient Active Problem List:     Hay fever     DM (diabetes mellitus) (HCC)     Depression     Urinary incontinence     Hyperlipidemia     HTN (hypertension)     Anxiety     Generalized headaches     OA (osteoarthritis)

## 2016-04-10 ENCOUNTER — Telehealth

## 2016-04-10 ENCOUNTER — Encounter

## 2016-04-10 NOTE — Telephone Encounter (Signed)
[facility]  Referral to Dr Kathlen Brunswickodd Russell

## 2016-04-10 NOTE — Telephone Encounter (Signed)
-----   Message from Laurena Slimmerheryl L Jeffers, NP sent at 04/10/2016 12:33 PM EDT -----  Slightly larger infrarenal abd aneurysm 3 cm, from 2.6 cm (has she seen vasc sugery to evaluate this yet?) small poss cyst on L kidney - needs further eval I would like to order MRI .Marland Kitchen.Marland Kitchen. CTA of chest was normal

## 2016-04-10 NOTE — Telephone Encounter (Signed)
Pt would like a vasc surg ref to someone in [facility] and the MRI to go to [facility].  Please advise.

## 2016-04-11 ENCOUNTER — Telehealth

## 2016-04-11 ENCOUNTER — Encounter

## 2016-04-11 NOTE — Telephone Encounter (Signed)
MRI order faxed to [facility] and VS referral faxed to Dr Baldwin [place].Russell.

## 2016-04-11 NOTE — Telephone Encounter (Signed)
[facility] [phone] needing to know if the MRI Abdomen without contrast should be with contrast as usually they are especially if there is a lesion.     Please advise

## 2016-04-11 NOTE — Telephone Encounter (Signed)
Changed order to mri with contrast

## 2016-04-12 NOTE — Telephone Encounter (Signed)
Called [facility] scheduling left message on voicemail

## 2016-04-12 NOTE — Telephone Encounter (Signed)
Call to Pt. Left message on voicemail

## 2016-04-17 NOTE — Telephone Encounter (Signed)
[facility]

## 2016-04-20 ENCOUNTER — Encounter

## 2016-04-20 ENCOUNTER — Inpatient Hospital Stay: Admit: 2016-04-20 | Payer: MEDICARE | Primary: Family Medicine

## 2016-04-20 DIAGNOSIS — I722 Aneurysm of renal artery: Secondary | ICD-10-CM

## 2016-04-20 LAB — CREATININE
Creatinine: 0.67 mg/dL (ref 0.50–0.90)
GFR African American: >60 mL/min (ref >60)
GFR Non-African American: >60 mL/min (ref >60)

## 2016-04-20 LAB — BUN: BUN: 13 mg/dL (ref 8–23)

## 2016-04-20 MED ORDER — GADOPENTETATE DIMEGLUMINE 469.01 MG/ML IV SOLN
469.01 MG/ML | Freq: Once | INTRAVENOUS | Status: AC | PRN
Start: 2016-04-20 — End: 2016-04-20
  Administered 2016-04-20: 17:00:00 20 mL via INTRAVENOUS

## 2016-04-22 NOTE — Telephone Encounter (Signed)
Last OV 04/09/2016        Health Maintenance   Topic Date Due   ??? Colon cancer screen colonoscopy  03/26/1998   ??? Diabetic foot exam  02/01/2015   ??? Diabetic retinal exam  10/29/2015   ??? Hepatitis C screen  05/30/2016 (Originally [birth year])   ??? Diabetic microalbuminuria test  05/30/2016   ??? Diabetic hemoglobin A1C test  07/27/2016   ??? Lipid screen  08/27/2016   ??? Pneumococcal low/med risk (2 of 2 - PPSV23) 04/09/2017   ??? Breast cancer screen  09/27/2017   ??? Zostavax vaccine  Addressed   ??? DEXA (modify frequency per FRAX score)  Addressed   ??? Flu vaccine  Completed             (applicable per patient's age: Cancer Screenings, Depression Screening, Fall Risk Screening, Immunizations)    Hemoglobin A1C (%)   Date Value   01/25/2016 6.9   08/28/2015 4.1   02/20/2015 7.2     Microalb/Crt. Ratio (mcg/mg creat)   Date Value   05/31/2013 11     LDL Cholesterol (mg/dL)   Date Value   16/10/960408/01/2013 120     LDL Calculated (mg/dL)   Date Value   54/09/811910/31/2016 129     AST (U/L)   Date Value   02/24/2015 19     ALT (U/L)   Date Value   02/24/2015 19     BUN (mg/dL)   Date Value   14/78/295606/24/2017 13      (goal A1C is < 7)   (goal LDL is <100) need 30-50% reduction from baseline     BP Readings from Last 3 Encounters:   04/09/16 112/72   12/18/15 110/70   10/02/15 130/72    (goal BP 120/80)      All Future Testing planned in [facility]:  Lab Frequency Next Occurrence   NM Hepatobiliary Scan With Pharm Agent Once 10/03/2016   MRI ABDOMEN WO CONTRAST Once 04/10/2016       Next Visit Date:  No future appointments.         Patient Active Problem List:     Hay fever     DM (diabetes mellitus) (HCC)     Depression     Urinary incontinence     Hyperlipidemia     HTN (hypertension)     Anxiety     Generalized headaches     OA (osteoarthritis)

## 2016-04-22 NOTE — Telephone Encounter (Signed)
-----   Message from Laurena Slimmerheryl L Jeffers, NP sent at 04/21/2016 10:34 AM EDT -----  Initial lesion seen on CT appears to be a cyst per MRI (she has 2, both look benign) and have been stable/unchanged since 2012- no need to re image

## 2016-04-22 NOTE — Telephone Encounter (Signed)
Pt aware of the results

## 2016-04-22 NOTE — Telephone Encounter (Signed)
-----   Message from Laurena Slimmerheryl L Jeffers, NP sent at 04/21/2016 10:32 AM EDT -----  Normal renal function

## 2016-04-23 MED ORDER — METFORMIN HCL 500 MG PO TABS
500 MG | ORAL_TABLET | ORAL | 0 refills | Status: DC
Start: 2016-04-23 — End: 2019-11-22

## 2016-06-19 NOTE — Telephone Encounter (Signed)
Needs CT and MRI mailed to her. Printed and mailed.

## 2016-07-05 MED ORDER — LEVOTHYROXINE SODIUM 150 MCG PO TABS
150 MCG | ORAL_TABLET | ORAL | 0 refills | Status: DC
Start: 2016-07-05 — End: 2020-03-17

## 2016-07-05 NOTE — Telephone Encounter (Signed)
Last OV 04/09/2016      Health Maintenance   Topic Date Due   ??? Hepatitis C screen  1945/12/14   ??? Colon cancer screen colonoscopy  03/26/1998   ??? Diabetic foot exam  02/01/2015   ??? Diabetic retinal exam  10/29/2015   ??? Diabetic microalbuminuria test  05/30/2016   ??? Flu vaccine (1) 06/28/2016   ??? Lipid screen  08/27/2016   ??? Diabetic hemoglobin A1C test  01/24/2017   ??? Pneumococcal low/med risk (2 of 2 - PPSV23) 04/09/2017   ??? Breast cancer screen  09/27/2017   ??? Zostavax vaccine  Addressed   ??? DEXA (modify frequency per FRAX score)  Addressed             (applicable per patient's age: Cancer Screenings, Depression Screening, Fall Risk Screening, Immunizations)    Hemoglobin A1C (%)   Date Value   01/25/2016 6.9   08/28/2015 4.1   02/20/2015 7.2     Microalb/Crt. Ratio (mcg/mg creat)   Date Value   05/31/2013 11     LDL Cholesterol (mg/dL)   Date Value   16/10/960408/01/2013 120     LDL Calculated (mg/dL)   Date Value   54/09/811910/31/2016 129     AST (U/L)   Date Value   02/24/2015 19     ALT (U/L)   Date Value   02/24/2015 19     BUN (mg/dL)   Date Value   14/78/295606/24/2017 13      (goal A1C is < 7)   (goal LDL is <100) need 30-50% reduction from baseline     BP Readings from Last 3 Encounters:   04/09/16 112/72   12/18/15 110/70   10/02/15 130/72    (goal BP 120/80)      All Future Testing planned in [facility]:  Lab Frequency Next Occurrence   NM Hepatobiliary Scan With Pharm Agent Once 10/03/2016   MRI ABDOMEN WO CONTRAST Once 04/10/2016       Next Visit Date:  No future appointments.         Patient Active Problem List:     Hay fever     DM (diabetes mellitus) (HCC)     Depression     Urinary incontinence     Hyperlipidemia     HTN (hypertension)     Anxiety     Generalized headaches     OA (osteoarthritis)

## 2016-07-08 MED ORDER — AMLODIPINE BESYLATE 5 MG PO TABS
5 | ORAL_TABLET | ORAL | 5 refills | Status: DC
Start: 2016-07-08 — End: 2019-11-22

## 2016-07-08 NOTE — Telephone Encounter (Signed)
Last OV 04/09/16  C Jeffers    Health Maintenance   Topic Date Due   ??? Hepatitis C screen  1946/06/23   ??? Colon cancer screen colonoscopy  03/26/1998   ??? Diabetic foot exam  02/01/2015   ??? Diabetic retinal exam  10/29/2015   ??? Diabetic microalbuminuria test  05/30/2016   ??? Flu vaccine (1) 06/28/2016   ??? Lipid screen  08/27/2016   ??? Diabetic hemoglobin A1C test  01/24/2017   ??? Pneumococcal low/med risk (2 of 2 - PPSV23) 04/09/2017   ??? Breast cancer screen  09/27/2017   ??? Zostavax vaccine  Addressed   ??? DEXA (modify frequency per FRAX score)  Addressed             (applicable per patient's age: Cancer Screenings, Depression Screening, Fall Risk Screening, Immunizations)    Hemoglobin A1C (%)   Date Value   01/25/2016 6.9   08/28/2015 4.1   02/20/2015 7.2     Microalb/Crt. Ratio (mcg/mg creat)   Date Value   05/31/2013 11     LDL Cholesterol (mg/dL)   Date Value   57/84/696208/01/2013 120     LDL Calculated (mg/dL)   Date Value   95/28/413210/31/2016 129     AST (U/L)   Date Value   02/24/2015 19     ALT (U/L)   Date Value   02/24/2015 19     BUN (mg/dL)   Date Value   44/01/027206/24/2017 13      (goal A1C is < 7)   (goal LDL is <100) need 30-50% reduction from baseline     BP Readings from Last 3 Encounters:   04/09/16 112/72   12/18/15 110/70   10/02/15 130/72    (goal BP 120/80)      All Future Testing planned in [facility]:  Lab Frequency Next Occurrence   NM Hepatobiliary Scan With Pharm Agent Once 10/03/2016   MRI ABDOMEN WO CONTRAST Once 04/10/2016       Next Visit Date:  No future appointments.         Patient Active Problem List:     Hay fever     DM (diabetes mellitus) (HCC)     Depression     Urinary incontinence     Hyperlipidemia     HTN (hypertension)     Anxiety     Generalized headaches     OA (osteoarthritis)

## 2016-10-08 MED ORDER — PRAVASTATIN SODIUM 40 MG PO TABS
40 | ORAL_TABLET | Freq: Every evening | ORAL | 3 refills | Status: DC
Start: 2016-10-08 — End: 2018-02-10

## 2016-10-08 NOTE — Telephone Encounter (Signed)
Last OV 04/09/16  C Jeffers    Health Maintenance   Topic Date Due   ??? Hepatitis C screen  January 03, 1946   ??? Colon cancer screen colonoscopy  03/26/1998   ??? Diabetic foot exam  02/01/2015   ??? Diabetic retinal exam  10/29/2015   ??? Diabetic microalbuminuria test  05/30/2016   ??? Flu vaccine (1) 06/28/2016   ??? Lipid screen  08/27/2016   ??? Diabetic hemoglobin A1C test  01/24/2017   ??? Pneumococcal low/med risk (2 of 2 - PPSV23) 04/09/2017   ??? Breast cancer screen  09/27/2017   ??? Zostavax vaccine  Addressed   ??? DEXA (modify frequency per FRAX score)  Addressed             (applicable per patient's age: Cancer Screenings, Depression Screening, Fall Risk Screening, Immunizations)    Hemoglobin A1C (%)   Date Value   01/25/2016 6.9   08/28/2015 4.1   02/20/2015 7.2     Microalb/Crt. Ratio (mcg/mg creat)   Date Value   05/31/2013 11     LDL Cholesterol (mg/dL)   Date Value   16/10/960408/01/2013 120     LDL Calculated (mg/dL)   Date Value   54/09/811910/31/2016 129     AST (U/L)   Date Value   02/24/2015 19     ALT (U/L)   Date Value   02/24/2015 19     BUN (mg/dL)   Date Value   14/78/295606/24/2017 13      (goal A1C is < 7)   (goal LDL is <100) need 30-50% reduction from baseline     BP Readings from Last 3 Encounters:   04/09/16 112/72   12/18/15 110/70   10/02/15 130/72    (goal BP 120/80)      All Future Testing planned in [facility]:  Lab Frequency Next Occurrence   NM Hepatobiliary Scan With Pharm Agent Once 10/03/2016   MRI ABDOMEN WO CONTRAST Once 04/10/2016       Next Visit Date:  No future appointments.         Patient Active Problem List:     Hay fever     DM (diabetes mellitus) (HCC)     Depression     Urinary incontinence     Hyperlipidemia     HTN (hypertension)     Anxiety     Generalized headaches     OA (osteoarthritis)      \\

## 2016-11-05 MED ORDER — VALSARTAN 80 MG PO TABS
80 | ORAL_TABLET | Freq: Every day | ORAL | 3 refills | Status: DC
Start: 2016-11-05 — End: 2019-11-22

## 2016-11-05 NOTE — Telephone Encounter (Signed)
Last OV 04/09/16-Jeffers    Health Maintenance   Topic Date Due   ??? Hepatitis C screen  1946/05/12   ??? Colon cancer screen colonoscopy  03/26/1998   ??? Diabetic foot exam  02/01/2015   ??? Diabetic retinal exam  10/29/2015   ??? Diabetic microalbuminuria test  05/30/2016   ??? Flu vaccine (1) 06/28/2016   ??? Lipid screen  08/27/2016   ??? A1C test (Diabetic or Prediabetic)  01/24/2017   ??? Pneumococcal low/med risk (2 of 2 - PPSV23) 04/09/2017   ??? Breast cancer screen  09/27/2017   ??? Zostavax vaccine  Addressed   ??? DEXA (modify frequency per FRAX score)  Addressed             (applicable per patient's age: Cancer Screenings, Depression Screening, Fall Risk Screening, Immunizations)    Hemoglobin A1C (%)   Date Value   01/25/2016 6.9   08/28/2015 4.1   02/20/2015 7.2     Microalb/Crt. Ratio (mcg/mg creat)   Date Value   05/31/2013 11     LDL Cholesterol (mg/dL)   Date Value   96/04/540908/01/2013 120     LDL Calculated (mg/dL)   Date Value   81/19/147810/31/2016 129     AST (U/L)   Date Value   02/24/2015 19     ALT (U/L)   Date Value   02/24/2015 19     BUN (mg/dL)   Date Value   29/56/213006/24/2017 13      (goal A1C is < 7)   (goal LDL is <100) need 30-50% reduction from baseline     BP Readings from Last 3 Encounters:   04/09/16 112/72   12/18/15 110/70   10/02/15 130/72    (goal BP 120/80)      All Future Testing planned in [facility]:  Lab Frequency Next Occurrence   MRI ABDOMEN WO CONTRAST Once 04/10/2016       Next Visit Date:  No future appointments.         Patient Active Problem List:     Hay fever     DM (diabetes mellitus) (HCC)     Depression     Urinary incontinence     Hyperlipidemia     HTN (hypertension)     Anxiety     Generalized headaches     OA (osteoarthritis)

## 2017-01-28 NOTE — Telephone Encounter (Signed)
Patient called asking for EGD/colonoscopy results from 1998 to go to Dr Myrle Sheng, GI @ [facility] for her appt this week with him.  I called patient to verify the correct Brandy [name].  Faxed to Dr Marga Hoots office.

## 2017-04-01 ENCOUNTER — Other Ambulatory Visit: Payer: Self-pay | Admitting: Internal Medicine

## 2017-04-01 DIAGNOSIS — Z1231 Encounter for screening mammogram for malignant neoplasm of breast: Secondary | ICD-10-CM

## 2017-04-15 ENCOUNTER — Ambulatory Visit
Admission: RE | Admit: 2017-04-15 | Discharge: 2017-04-15 | Disposition: A | Discharge status: Home / Self Care | Payer: Medicare Other | Source: Ambulatory Visit | Attending: Internal Medicine | Admitting: Internal Medicine

## 2017-04-15 DIAGNOSIS — Z1231 Encounter for screening mammogram for malignant neoplasm of breast: Secondary | ICD-10-CM

## 2018-02-10 NOTE — Telephone Encounter (Signed)
Last OV 04/09/16    Health Maintenance   Topic Date Due   ??? Hepatitis C screen  02/04/1946   ??? Shingles Vaccine (1 of 2) 03/26/1998   ??? Colon cancer screen colonoscopy  03/26/1998   ??? Diabetic foot exam  02/01/2015   ??? Diabetic retinal exam  10/29/2015   ??? Diabetic microalbuminuria test  05/30/2016   ??? Lipid screen  08/27/2016   ??? A1C test (Diabetic or Prediabetic)  01/24/2017   ??? Pneumococcal 65+ years Vaccine (2 of 2 - PPSV23) 04/09/2017   ??? Potassium monitoring  04/09/2017   ??? Creatinine monitoring  04/20/2017   ??? Breast cancer screen  09/27/2017   ??? Flu vaccine (Season Ended) 06/28/2018   ??? DEXA (modify frequency per FRAX score)  Addressed             (applicable per patient's age: Cancer Screenings, Depression Screening, Fall Risk Screening, Immunizations)    Hemoglobin A1C (%)   Date Value   01/25/2016 6.9   08/28/2015 4.1   02/20/2015 7.2     Microalb/Crt. Ratio (mcg/mg creat)   Date Value   05/31/2013 11     LDL Cholesterol (mg/dL)   Date Value   78/29/562108/01/2013 120     LDL Calculated (mg/dL)   Date Value   30/86/578410/31/2016 129     AST (U/L)   Date Value   02/24/2015 19     ALT (U/L)   Date Value   02/24/2015 19     BUN (mg/dL)   Date Value   69/62/952806/24/2017 13      (goal A1C is < 7)   (goal LDL is <100) need 30-50% reduction from baseline     BP Readings from Last 3 Encounters:   04/09/16 112/72   12/18/15 110/70   10/02/15 130/72    (goal BP 120/80)      All Future Testing planned in [facility]:  Lab Frequency Next Occurrence       Next Visit Date:  No future appointments.         Patient Active Problem List:     Hay fever     DM (diabetes mellitus) (HCC)     Depression     Urinary incontinence     Hyperlipidemia     HTN (hypertension)     Anxiety     Generalized headaches     OA (osteoarthritis)

## 2018-02-11 MED ORDER — PRAVASTATIN SODIUM 40 MG PO TABS
40 MG | ORAL_TABLET | Freq: Every evening | ORAL | 1 refills | Status: DC
Start: 2018-02-11 — End: 2020-06-02

## 2018-07-20 NOTE — Telephone Encounter (Signed)
Please Approve or Refuse.  Send to Pharmacy per Pt's Request:     [facility] [place]     Next Visit Date:  Visit date not found   Last Visit Date: Visit date not found    Hemoglobin A1C (%)   Date Value   01/25/2016 6.9   08/28/2015 4.1   02/20/2015 7.2             ( goal A1C is < 7)   BP Readings from Last 3 Encounters:   04/09/16 112/72   12/18/15 110/70   10/02/15 130/72          (goal 120/80)  BUN   Date Value Ref Range Status   04/20/2016 13 8 - 23 mg/dL Final     Comment:     [facility] [address] [place], [place] [id number]   [phone]       CREATININE   Date Value Ref Range Status   04/20/2016 0.67 0.50 - 0.90 mg/dL Final     Potassium   Date Value Ref Range Status   02/24/2015 4.2 mmol/L Corrected

## 2018-11-11 ENCOUNTER — Encounter: Attending: Infectious Disease | Primary: Family Medicine

## 2019-11-22 ENCOUNTER — Ambulatory Visit: Admit: 2019-11-22 | Discharge: 2019-11-22 | Payer: MEDICARE | Attending: Family Medicine | Primary: Family Medicine

## 2019-11-22 DIAGNOSIS — E119 Type 2 diabetes mellitus without complications: Secondary | ICD-10-CM

## 2019-11-22 MED ORDER — VICTOZA 18 MG/3ML SC SOPN
18 MG/3ML | PEN_INJECTOR | Freq: Every day | SUBCUTANEOUS | 3 refills | Status: DC
Start: 2019-11-22 — End: 2020-03-17

## 2019-11-29 LAB — HEMOGLOBIN A1C: Hemoglobin A1C: 7.1 %

## 2020-03-16 MED ORDER — CITALOPRAM HYDROBROMIDE 40 MG PO TABS
40 MG | ORAL_TABLET | Freq: Every day | ORAL | 0 refills | Status: DC
Start: 2020-03-16 — End: 2020-03-17

## 2020-03-16 NOTE — Telephone Encounter (Signed)
Next appt 03/17/20

## 2020-03-16 NOTE — Telephone Encounter (Signed)
Last appt 11/22/2019

## 2020-03-17 ENCOUNTER — Ambulatory Visit: Admit: 2020-03-17 | Discharge: 2020-03-17 | Payer: MEDICARE | Attending: Family Medicine | Primary: Family Medicine

## 2020-03-17 ENCOUNTER — Telehealth

## 2020-03-17 DIAGNOSIS — I10 Essential (primary) hypertension: Secondary | ICD-10-CM

## 2020-03-17 MED ORDER — VICTOZA 18 MG/3ML SC SOPN
18 MG/3ML | PEN_INJECTOR | Freq: Every day | SUBCUTANEOUS | 3 refills | Status: DC
Start: 2020-03-17 — End: 2020-08-02

## 2020-03-17 MED ORDER — IRBESARTAN 150 MG PO TABS
150 MG | ORAL_TABLET | Freq: Every evening | ORAL | 3 refills | Status: DC
Start: 2020-03-17 — End: 2021-02-19

## 2020-03-17 MED ORDER — LEVOTHYROXINE SODIUM 150 MCG PO TABS
150 MCG | ORAL_TABLET | ORAL | 3 refills | Status: DC
Start: 2020-03-17 — End: 2021-02-19

## 2020-03-17 MED ORDER — CITALOPRAM HYDROBROMIDE 40 MG PO TABS
40 MG | ORAL_TABLET | Freq: Every day | ORAL | 3 refills | Status: DC
Start: 2020-03-17 — End: 2021-02-19

## 2020-03-17 MED ORDER — PANTOPRAZOLE SODIUM 40 MG PO TBEC
40 MG | ORAL_TABLET | Freq: Two times a day (BID) | ORAL | 3 refills | Status: DC
Start: 2020-03-17 — End: 2021-02-19

## 2020-03-17 NOTE — Telephone Encounter (Signed)
[facility] scheduling calling to inform doctor that patient complain of soreness and brushing  above her right nipple, so they need mammogram order need to turn into a diagnostic mam, Clinical research associate pended order.

## 2020-03-22 NOTE — Telephone Encounter (Signed)
Patient has order for screening mammogram.  Did c/o pain just above right nipple which will change this order to a diagnostic mammogram.  [facility] Scheduling calling to have this order changed.

## 2020-03-23 NOTE — Telephone Encounter (Signed)
Please fax a copy of the diagnostic mammogram order to [facility] as per patient request.

## 2020-03-23 NOTE — Telephone Encounter (Signed)
Order faxed over to [facility] Scheduling.

## 2020-04-20 ENCOUNTER — Encounter

## 2020-06-02 ENCOUNTER — Ambulatory Visit: Admit: 2020-06-02 | Discharge: 2020-06-02 | Payer: MEDICARE | Attending: Family Medicine | Primary: Family Medicine

## 2020-06-02 DIAGNOSIS — Z0000 Encounter for general adult medical examination without abnormal findings: Secondary | ICD-10-CM

## 2020-06-02 MED ORDER — FLUCONAZOLE 100 MG PO TABS
100 MG | ORAL_TABLET | Freq: Every day | ORAL | 1 refills | Status: AC
Start: 2020-06-02 — End: 2020-06-08

## 2020-06-02 MED ORDER — PRAVASTATIN SODIUM 40 MG PO TABS
40 MG | ORAL_TABLET | Freq: Every evening | ORAL | 3 refills | Status: DC
Start: 2020-06-02 — End: 2021-02-19

## 2020-06-02 NOTE — Telephone Encounter (Signed)
Patient here for her annual wellness visit asking for Diflucan for a yeast infection she got from sitting in a wet bathing suit

## 2020-06-06 ENCOUNTER — Telehealth

## 2020-06-06 NOTE — Telephone Encounter (Signed)
Patient due for Colon cancer screening    Pending: Cologuard or GI referral

## 2020-06-07 NOTE — Telephone Encounter (Signed)
Thank-you  !    Stephenia Vogan, DO

## 2020-06-07 NOTE — Telephone Encounter (Signed)
Patient calling states she was instructed to take Diflucan 1 daily x 6 days but only 3 tablets were called in.  Please advise

## 2020-06-07 NOTE — Telephone Encounter (Signed)
That's the correct prescription. Please have her schedule with me if she feels the condition is not resolved.

## 2020-06-09 NOTE — Telephone Encounter (Signed)
Patient notified that prescription is correct.

## 2020-06-20 ENCOUNTER — Encounter

## 2020-06-26 ENCOUNTER — Institutional Professional Consult (permissible substitution): Payer: Medicare Other | Admitting: Neurology

## 2020-07-26 NOTE — Telephone Encounter (Signed)
Bentyl pending for refill     Health Maintenance   Topic Date Due   ??? Colon cancer screen colonoscopy  Never done   ??? Diabetic foot exam  02/01/2015   ??? Diabetic retinal exam  10/29/2015   ??? Diabetic microalbuminuria test  05/30/2016   ??? Lipid screen  08/27/2016   ??? Pneumococcal 65+ years Vaccine (2 of 2 - PPSV23) 04/09/2017   ??? Potassium monitoring  04/09/2017   ??? Creatinine monitoring  04/20/2017   ??? COVID-19 Vaccine (2 - Moderna 2-dose series) 06/21/2020   ??? Flu vaccine (1) 06/28/2020   ??? Shingles Vaccine (1 of 2) 11/21/2020 (Originally 03/26/1998)   ??? Hepatitis C screen  11/21/2020 (Originally 1945/12/24)   ??? A1C test (Diabetic or Prediabetic)  11/28/2020   ??? Annual Wellness Visit (AWV)  06/03/2021   ??? Breast cancer screen  03/30/2022   ??? DEXA (modify frequency per FRAX score)  Addressed   ??? Hepatitis A vaccine  Aged Out   ??? Hib vaccine  Aged Out   ??? Meningococcal (ACWY) vaccine  Aged Out             (applicable per patient's age: Cancer Screenings, Depression Screening, Fall Risk Screening, Immunizations)    Hemoglobin A1C (%)   Date Value   11/29/2019 7.1   01/25/2016 6.9   08/28/2015 4.1     Microalb/Crt. Ratio (mcg/mg creat)   Date Value   05/31/2013 11     LDL Cholesterol (mg/dL)   Date Value   76/16/0737 120     LDL Calculated (mg/dL)   Date Value   10/62/6948 129     AST (U/L)   Date Value   02/24/2015 19     ALT (U/L)   Date Value   02/24/2015 19     BUN (mg/dL)   Date Value   54/62/7035 13      (goal A1C is < 7)   (goal LDL is <100) need 30-50% reduction from baseline     BP Readings from Last 3 Encounters:   06/02/20 118/68   03/17/20 (!) 145/75   11/22/19 118/68    (goal BP 120/80)      All Future Testing planned in [facility]:  Lab Frequency Next Occurrence   TSH With Reflex Ft4 Once 11/22/2019   Basic Metabolic Panel Once 11/22/2019   Hemoglobin A1C Once 11/22/2019   MAM DIGITAL SCREEN SELF REFERRAL W OR WO CAD BILATERAL Once 11/22/2019   XR HIP RIGHT (2-3 VIEWS) Once 03/17/2021   MRI HIP RIGHT WO  CONTRAST Once 03/17/2020   Cologuard Once 06/07/2020       Next Visit Date:  No future appointments.         Patient Active Problem List:     Hay fever     DM (diabetes mellitus) (HCC)     Depression     Urinary incontinence     Hyperlipidemia     HTN (hypertension)     Anxiety     Generalized headaches     OA (osteoarthritis)

## 2020-07-28 ENCOUNTER — Encounter

## 2020-08-02 MED ORDER — VICTOZA 18 MG/3ML SC SOPN
18 MG/3ML | SUBCUTANEOUS | 2 refills | Status: DC
Start: 2020-08-02 — End: 2020-12-22

## 2020-08-08 ENCOUNTER — Telehealth

## 2020-08-08 NOTE — Telephone Encounter (Signed)
Patient calling for a refill on BENTYL     Unable to find medication in chart please review and advise

## 2020-08-10 MED ORDER — DICYCLOMINE HCL 10 MG PO CAPS
10 MG | ORAL_CAPSULE | Freq: Four times a day (QID) | ORAL | 0 refills | Status: DC
Start: 2020-08-10 — End: 2021-02-19

## 2020-11-15 NOTE — Telephone Encounter (Signed)
Please return a call to the patient - offer an appointment with first available PGY Resident at the [address] office ([place]).    Thank-you,  Genae Strine, DO

## 2020-11-15 NOTE — Telephone Encounter (Signed)
Patient called office asking for a Z-Pak to be sent to [facility] in [place]. She has sinus cold with pressure. No other symptoms.

## 2020-11-15 NOTE — Telephone Encounter (Signed)
Patient scheduled for a phone visit with Dr. Mason Jim tomorrow morning.

## 2020-11-16 ENCOUNTER — Telehealth
Admit: 2020-11-16 | Discharge: 2020-11-16 | Payer: MEDICARE | Attending: Student in an Organized Health Care Education/Training Program | Primary: Family Medicine

## 2020-11-16 DIAGNOSIS — E119 Type 2 diabetes mellitus without complications: Secondary | ICD-10-CM

## 2020-11-16 MED ORDER — AZITHROMYCIN 500 MG PO TABS
500 MG | ORAL_TABLET | Freq: Every day | ORAL | 0 refills | Status: AC
Start: 2020-11-16 — End: 2020-11-19

## 2020-11-16 NOTE — Progress Notes (Signed)
Attending Physician Statement  I have discussed the care of Brandy [name], 75 y.o. female,including pertinent history and exam findings,  with the resident Dr. Sissy Hoff, MD.  History:  Chief Complaint   Patient presents with   ??? Sinusitis     sinus infection - needs help to clear head congestion, started sunday      Patient underwent a phone visit for complaints of sinus pressure, headache and purulent draingage.   I have reviewed the key elements of the encounter with the resident. Examination was done by resident as documented in residents note.  BP Readings from Last 3 Encounters:   06/02/20 118/68   03/17/20 (!) 145/75   11/22/19 118/68     There were no vitals taken for this visit.  Lab Results   Component Value Date    WBC 7.9 02/24/2015    HGB 14.1 02/24/2015    HCT 42.4 02/24/2015    PLT 283 02/24/2015    CHOL 215 08/28/2015    TRIG 256 08/28/2015    HDL 35 08/28/2015    ALT 19 02/24/2015    AST 19 02/24/2015    NA 137 02/24/2015    K 4.2 02/24/2015    CL 97 02/24/2015    CREATININE 0.67 04/20/2016    BUN 13 04/20/2016    CO2 30 02/24/2015    TSH 1.44 01/25/2016    LABA1C 7.1 11/29/2019    LABMICR 11 05/31/2013     Lab Results   Component Value Date    CALCIUM 9.3 02/24/2015     Lab Results   Component Value Date    LDLCALC 129 08/28/2015    LDLCHOLESTEROL 120 05/31/2013     I agree with the assessment, plan and diagnosis of    Diagnosis Orders   1. Type 2 diabetes mellitus without complication, without long-term current use of insulin (HCC)  Hemoglobin A1C   2. Acute rhinosinusitis  azithromycin (ZITHROMAX) 500 MG tablet     I agree with  orders as documented by the resident.  Recommendations:   Clinical presentation is consistent with acute sinusitis, patient counseled and started on Zithromax and supportive treatment.   Return if symptoms worsen or fail to improve.   (GE Modifier ) Dr. Geronimo Running, MD

## 2020-11-16 NOTE — Progress Notes (Signed)
[facility]    Family Medicine Residency Program - Virtual Visit        Brandy [name] is a 75 y.o. female evaluated via telephone on 11/16/2020.      Consent:  She and/or health care decision maker is aware that that she may receive a bill for this telephone service, depending on her insurance coverage, and has provided verbal consent to proceed: Yes      Documentation:  I communicated with the patient and/or health care decision maker about patient has been having headache and sinus pain and pressure as well as nasal drainage.  History of sinus infections, patient states azithromycin has worked in the past.  Some chills but no fevers, no dyspnea no cough.  Patient understands if she has dyspnea or worsening symptoms to call back or be seen in the emergency department.    Details of this discussion including any medical advice provided:   ASSESSMENT/PLAN:    1. Type 2 diabetes mellitus without complication, without long-term current use of insulin (HCC)  - Hemoglobin A1C; Future    2. Acute rhinosinusitis  - azithromycin (ZITHROMAX) 500 MG tablet; Take 1 tablet by mouth daily for 3 days  Dispense: 3 tablet; Refill: 0          Requested Prescriptions     Signed Prescriptions Disp Refills   ??? azithromycin (ZITHROMAX) 500 MG tablet 3 tablet 0     Sig: Take 1 tablet by mouth daily for 3 days       There are no discontinued medications.    Return if symptoms worsen or fail to improve.    I affirm this is a Patient Initiated Episode with a Patient who has not had a related appointment within my department in the past 7 days or scheduled within the next 24 hours.    Patient identification was verified at the start of the visit: Yes    Total Time: minutes: 5-10 minutes    Note: not billable if this call serves to triage the patient into an appointment for the relevant concern      Jawanna Dykman A. Mason Jim, MD  Family Medicine PGY-3  11/16/20 at 10:59 AM

## 2020-12-21 ENCOUNTER — Encounter

## 2020-12-22 MED ORDER — VICTOZA 18 MG/3ML SC SOPN
18 MG/3ML | SUBCUTANEOUS | 2 refills | Status: DC
Start: 2020-12-22 — End: 2021-04-04

## 2021-02-19 ENCOUNTER — Ambulatory Visit: Admit: 2021-02-19 | Discharge: 2021-02-19 | Payer: MEDICARE | Attending: Family Medicine | Primary: Family Medicine

## 2021-02-19 DIAGNOSIS — F329 Major depressive disorder, single episode, unspecified: Secondary | ICD-10-CM

## 2021-02-19 MED ORDER — DICYCLOMINE HCL 10 MG PO CAPS
10 MG | ORAL_CAPSULE | Freq: Four times a day (QID) | ORAL | 5 refills | Status: DC
Start: 2021-02-19 — End: 2021-05-07

## 2021-02-19 MED ORDER — LEVOTHYROXINE SODIUM 150 MCG PO TABS
150 MCG | ORAL_TABLET | ORAL | 3 refills | Status: DC
Start: 2021-02-19 — End: 2022-03-18

## 2021-02-19 MED ORDER — PRAVASTATIN SODIUM 40 MG PO TABS
40 MG | ORAL_TABLET | Freq: Every evening | ORAL | 3 refills | Status: DC
Start: 2021-02-19 — End: 2022-03-01

## 2021-02-19 MED ORDER — IRBESARTAN 150 MG PO TABS
150 MG | ORAL_TABLET | Freq: Every evening | ORAL | 3 refills | Status: DC
Start: 2021-02-19 — End: 2021-04-09

## 2021-02-19 MED ORDER — FLUOXETINE HCL 20 MG PO CAPS
20 MG | ORAL_CAPSULE | Freq: Every day | ORAL | 3 refills | Status: DC
Start: 2021-02-19 — End: 2022-01-28

## 2021-02-19 MED ORDER — PANTOPRAZOLE SODIUM 40 MG PO TBEC
40 MG | ORAL_TABLET | Freq: Two times a day (BID) | ORAL | 3 refills | Status: DC
Start: 2021-02-19 — End: 2021-04-19

## 2021-02-19 NOTE — Progress Notes (Signed)
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  Mood - terrible lately, not suicidal    Refills - see orders    Negative for:     Worry / mood complaints  Headache  Dizziness  Visual Disturbance  Hearing Changes  Nasal / sinus Symptoms  Mouth / tooth symptom, pain  Throat pain  Difficulty swallowing  Neck pain  Chest discomfort  Cough  SOB  N/V/D/C  Pelvic area discomfort  Bladder / voiding discomfort  Bowel complaints  MS complaints   Numbness/tingling/abnormal sensations   Edema / Leg swelling  Dizziness  Fatigue  Bleeding   Skin    Pertinent Pos: See HPI - moods poor    Vitals:    02/19/21 1515   BP: 134/74   Pulse: 76   Temp: 96.9 ??F (36.1 ??C)       Alert and oriented to PPT  NAD    HEENT - neg  Neck - no bruits, no lymphadenopathy  Chest  HRRR w/o murmer  LCTAB no wheezes / rhonchi  Extremities - 1+ PTE    Gait / Station - stable, no dysequilibrium, uniform pace, no assist device, cane.     Diagnosis Orders   1. Reactive depression  FLUoxetine (PROZAC) 20 MG capsule   2. Urge incontinence of urine  dicyclomine (BENTYL) 10 MG capsule   3. Gastroesophageal reflux disease with esophagitis without hemorrhage  pantoprazole (PROTONIX) 40 MG tablet   4. Hypothyroidism, unspecified type  levothyroxine (SYNTHROID) 150 MCG tablet   5. Essential hypertension  irbesartan (AVAPRO) 150 MG tablet   6. Mixed hyperlipidemia  pravastatin (PRAVACHOL) 40 MG tablet       Plan:  1.)  Refills above  2.)  Reck 6-9 mos

## 2021-02-19 NOTE — Patient Instructions (Signed)
Thank you for letting us take care of you today. We hope all your questions were addressed. If a question was overlooked or something else comes to mind after you return home, please contact a member of your Care Team listed below.      Your Care Team at [facility] is Team #2  Nicholas Espinoza, DO (Faculty)  Enas Kanama,MD (Faculty)  Tarnjit Khakh, MD (Resident)  Ahmad Alawneh, MD (Resident)  Arvind Datt, MD (Resident)  Meina Missak, MD (Resident)  Edwing Figley S., RMA  Tasha J.,  MA  Sharon S, Brenda M., Kristi S. (Front office)  Kira Ricard, RMA (Clinical Practice Manager)  Lisa McIntyre, RPH (Clinical Pharmacist)     Office phone number: [phone]    If you need to get in right away due to illness, please be advised we have "Same Day" appointments available Monday-Friday. Please call us at [phone] option #3 to schedule your "Same Day" appointment.

## 2021-02-19 NOTE — Progress Notes (Signed)
Visit Information    Have you changed or started any medications since your last visit including any over-the-counter medicines, vitamins, or herbal medicines? no   Have you stopped taking any of your medications? Is so, why? -  no  Are you having any side effects from any of your medications? - no    Have you seen any other physician or provider since your last visit?  no   Have you had any other diagnostic tests since your last visit?  no   Have you been seen in the emergency room and/or had an admission in a hospital since we last saw you?  no   Have you had your routine dental cleaning in the past 6 months?  no     Do you have an active MyChart account? If no, what is the barrier?  Yes    Patient Care Team:  Nolon Stalls, DO as PCP - General (Family Medicine)  Nolon Stalls, DO as PCP - [facility] Empaneled Provider  Ike Bene, DO as Surgeon (Orthopedic Surgery)  Gena Fray, MD as Consulting Physician (Pulmonology)    Medical History Review  Past Medical, Family, and Social History reviewed and does not contribute to the patient presenting condition    Health Maintenance   Topic Date Due   ??? Hepatitis C screen  Never done   ??? Colorectal Cancer Screen  Never done   ??? Shingles Vaccine (1 of 2) Never done   ??? Diabetic foot exam  02/01/2015   ??? Diabetic retinal exam  10/29/2015   ??? Diabetic microalbuminuria test  05/30/2016   ??? Lipids  08/27/2016   ??? Pneumococcal 65+ years Vaccine (2 - PPSV23 or PCV20) 04/09/2017   ??? COVID-19 Vaccine (2 - Moderna 3-dose series) 06/21/2020   ??? Potassium  11/28/2020   ??? Creatinine  11/28/2020   ??? Depression Monitoring  06/02/2021   ??? Annual Wellness Visit (AWV)  06/03/2021   ??? Flu vaccine (Season Ended) 06/28/2021   ??? A1C test (Diabetic or Prediabetic)  11/28/2021   ??? Breast cancer screen  03/30/2022   ??? DEXA (modify frequency per FRAX score)  Addressed   ??? Hepatitis A vaccine  Aged Out   ??? Hib vaccine  Aged Out   ??? Meningococcal (ACWY) vaccine  Aged Out

## 2021-03-16 NOTE — Telephone Encounter (Signed)
VM left for patient with the correct date of her appointment which is 04/10/21 at 9 am.

## 2021-03-16 NOTE — Telephone Encounter (Signed)
-----   Message from Demetrius Charity sent at 03/16/2021 11:27 AM EDT -----  Subject: Message to Provider    QUESTIONS  Information for Provider? Patient states that she has written down an   appointment at 1:30 on Memorial Day 5.30.2022 for an A1C check. Patient is   wanting to know if the practice is open on 5.30.2022 for her to have this   done. Patient has an appointment scheduled for June 14th at [facility] for an A1C   check and patient states that she does not do mornings and was unaware   this appointment was scheduled. Patient is confused about what PCP would   like for her to do so please reach out and discuss A1C checks with   patient.   ---------------------------------------------------------------------------  --------------  CALL BACK INFO  What is the best way for the office to contact you? OK to leave message on   voicemail  Preferred Call Back Phone Number? [phone]  ---------------------------------------------------------------------------  --------------  SCRIPT ANSWERS  Relationship to Patient? Self

## 2021-03-20 ENCOUNTER — Encounter

## 2021-04-04 ENCOUNTER — Encounter

## 2021-04-04 MED ORDER — VICTOZA 18 MG/3ML SC SOPN
18 MG/3ML | SUBCUTANEOUS | 5 refills | Status: AC
Start: 2021-04-04 — End: 2022-02-07

## 2021-04-09 ENCOUNTER — Encounter

## 2021-04-09 MED ORDER — IRBESARTAN 150 MG PO TABS
150 MG | ORAL_TABLET | ORAL | 3 refills | Status: AC
Start: 2021-04-09 — End: 2022-06-27

## 2021-04-09 NOTE — Telephone Encounter (Signed)
Last seen: 4/25/822  Last Prescribed:02/19/21    Next appt: not scheduled.

## 2021-04-10 ENCOUNTER — Encounter: Primary: Family Medicine

## 2021-04-16 ENCOUNTER — Ambulatory Visit: Admit: 2021-04-16 | Discharge: 2021-04-16 | Payer: MEDICARE | Primary: Family Medicine

## 2021-04-16 DIAGNOSIS — E119 Type 2 diabetes mellitus without complications: Secondary | ICD-10-CM

## 2021-04-16 LAB — POCT GLYCOSYLATED HEMOGLOBIN (HGB A1C): Hemoglobin A1C: 6.5 %

## 2021-04-16 NOTE — Progress Notes (Signed)
Patient was seen in office today for Hemoglobin A1C: results 6.5%

## 2021-04-19 ENCOUNTER — Encounter

## 2021-04-19 MED ORDER — PANTOPRAZOLE SODIUM 40 MG PO TBEC
40 MG | ORAL_TABLET | ORAL | 3 refills | Status: AC
Start: 2021-04-19 — End: 2022-06-27

## 2021-04-19 NOTE — Telephone Encounter (Signed)
Please Approve or Refuse.  Send to Pharmacy per Pt's Request:      Next Visit Date:  Visit date not found   Last Visit Date: 03/17/2020    Hemoglobin A1C (%)   Date Value   04/16/2021 6.5   11/29/2019 7.1   01/25/2016 6.9             ( goal A1C is < 7)   BP Readings from Last 3 Encounters:   02/19/21 134/74   06/02/20 118/68   03/17/20 (!) 145/75          (goal 120/80)  BUN   Date Value Ref Range Status   04/20/2016 13 8 - 23 mg/dL Final     Comment:     Performed at [facility] [address] [place], [place] [address]   [phone]       CREATININE   Date Value Ref Range Status   04/20/2016 0.67 0.50 - 0.90 mg/dL Final     Potassium   Date Value Ref Range Status   02/24/2015 4.2 mmol/L Corrected

## 2021-05-04 ENCOUNTER — Encounter

## 2021-05-07 MED ORDER — DICYCLOMINE HCL 10 MG PO CAPS
10 MG | ORAL_CAPSULE | ORAL | 5 refills | Status: DC
Start: 2021-05-07 — End: 2022-05-03

## 2021-05-07 MED ORDER — BD PEN NEEDLE SHORT U/F 31G X 8 MM MISC
3 refills | Status: AC
Start: 2021-05-07 — End: 2023-01-23

## 2021-05-07 NOTE — Telephone Encounter (Signed)
Last visit: 02/19/2021  Last Med refill:   Does patient have enough medication for 72 hours: No:     Next Visit Date:  No future appointments.    Health Maintenance   Topic Date Due   ??? Hepatitis C screen  Never done   ??? Colorectal Cancer Screen  Never done   ??? Shingles vaccine (1 of 2) Never done   ??? Diabetic foot exam  02/01/2015   ??? Diabetic retinal exam  10/29/2015   ??? Diabetic microalbuminuria test  05/30/2016   ??? Lipids  08/27/2016   ??? Pneumococcal 65+ years Vaccine (2 - PPSV23 or PCV20) 04/09/2017   ??? COVID-19 Vaccine (2 - Moderna 3-dose series) 06/21/2020   ??? Annual Wellness Visit (AWV)  06/03/2021   ??? Flu vaccine (1) 06/28/2021   ??? Depression Monitoring  02/19/2022   ??? Breast cancer screen  03/30/2022   ??? A1C test (Diabetic or Prediabetic)  04/16/2022   ??? DEXA (modify frequency per FRAX score)  Addressed   ??? Hepatitis A vaccine  Aged Out   ??? Hib vaccine  Aged Out   ??? Meningococcal (ACWY) vaccine  Aged Out       Hemoglobin A1C (%)   Date Value   04/16/2021 6.5   11/29/2019 7.1   01/25/2016 6.9             ( goal A1C is < 7)   Microalb/Crt. Ratio (mcg/mg creat)   Date Value   05/31/2013 11     LDL Cholesterol (mg/dL)   Date Value   25/95/6387 120   07/15/2012 139 (H)     LDL Calculated (mg/dL)   Date Value   56/43/3295 129   07/21/2014 138       (goal LDL is <100)   AST (U/L)   Date Value   02/24/2015 19     ALT (U/L)   Date Value   02/24/2015 19     BUN (mg/dL)   Date Value   18/84/1660 13     BP Readings from Last 3 Encounters:   02/19/21 134/74   06/02/20 118/68   03/17/20 (!) 145/75          (goal 120/80)    All Future Testing planned in [facility]  Lab Frequency Next Occurrence   Cologuard Once 06/07/2020   Hemoglobin A1C Once 11/16/2021               Patient Active Problem List:     Hay fever     DM (diabetes mellitus) (HCC)     Depression     Urinary incontinence     Hyperlipidemia     HTN (hypertension)     Anxiety     Generalized headaches     OA (osteoarthritis)

## 2021-05-22 ENCOUNTER — Other Ambulatory Visit: Payer: Self-pay | Admitting: Internal Medicine

## 2021-05-22 DIAGNOSIS — Z1231 Encounter for screening mammogram for malignant neoplasm of breast: Secondary | ICD-10-CM

## 2021-06-26 ENCOUNTER — Encounter: Primary: Family Medicine

## 2021-07-12 ENCOUNTER — Other Ambulatory Visit: Payer: Self-pay

## 2021-07-12 ENCOUNTER — Ambulatory Visit
Admission: RE | Admit: 2021-07-12 | Discharge: 2021-07-12 | Disposition: A | Discharge status: Home / Self Care | Payer: Medicare Other | Source: Ambulatory Visit | Attending: Internal Medicine | Admitting: Internal Medicine

## 2021-07-12 DIAGNOSIS — Z1231 Encounter for screening mammogram for malignant neoplasm of breast: Secondary | ICD-10-CM

## 2021-09-19 NOTE — Telephone Encounter (Signed)
Called patient to get her scheduled for her annual AWV, patient refuses to schedule stating this visit is not necessary, she will call office when she needs to see her PCP.

## 2022-01-04 ENCOUNTER — Encounter: Payer: MEDICARE | Attending: Family Medicine | Primary: Family Medicine

## 2022-01-04 NOTE — Telephone Encounter (Signed)
-----   Message from Claudette Cue sent at 01/04/2022  8:10 AM EST -----  Subject: Refill Request    QUESTIONS  Name of Medication? Other - Ativan    Patient-reported dosage and instructions? 1mg , when needed  How many days do you have left? 10  Preferred Pharmacy? [facility] [phone]  Pharmacy phone number (if available)? [phone]  ---------------------------------------------------------------------------  --------------  Altamese Ates INFO  What is the best way for the office to contact you? OK to leave message on   voicemail  Preferred Call Back Phone Number? [phone]  ---------------------------------------------------------------------------  --------------  SCRIPT ANSWERS  Relationship to Patient? Self

## 2022-01-26 ENCOUNTER — Encounter

## 2022-01-28 MED ORDER — FLUOXETINE HCL 20 MG PO CAPS
20 MG | ORAL_CAPSULE | Freq: Every day | ORAL | 3 refills | Status: AC
Start: 2022-01-28 — End: 2022-10-25

## 2022-01-28 NOTE — Telephone Encounter (Signed)
Last visit: 02/19/21  Last Med refill: 02/19/21  Does patient have enough medication for 72 hours: No: PATIENT HAS APPOINTMENT 03/18/22    Next Visit Date:  Future Appointments   Date Time Provider Department Center   03/18/2022  3:30 PM Nolon Stalls, DO [place] [facility]       Health Maintenance   Topic Date Due    Hepatitis C screen  Never done    Colorectal Cancer Screen  Never done    Shingles vaccine (1 of 2) Never done    Diabetic foot exam  02/01/2015    Diabetic retinal exam  10/29/2015    Diabetic Alb to Cr ratio (uACR) test  05/30/2016    Pneumococcal 65+ years Vaccine (2 - PPSV23 if available, else PCV20) 06/04/2016    Lipids  08/27/2016    GFR test (Diabetes, CKD 3-4, OR last GFR 15-59)  04/20/2017    COVID-19 Vaccine (2 - Moderna series) 06/21/2020    Annual Wellness Visit (AWV)  06/03/2021    Depression Monitoring  02/19/2022    Breast cancer screen  03/30/2022    A1C test (Diabetic or Prediabetic)  04/16/2022    Flu vaccine (Season Ended) 05/28/2022    DEXA (modify frequency per FRAX score)  Addressed    Hepatitis A vaccine  Aged Out    Hib vaccine  Aged Out    Meningococcal (ACWY) vaccine  Aged Out       Hemoglobin A1C (%)   Date Value   04/16/2021 6.5   11/29/2019 7.1   01/25/2016 6.9             ( goal A1C is < 7)   Microalb/Crt. Ratio (mcg/mg creat)   Date Value   05/31/2013 11     LDL Cholesterol (mg/dL)   Date Value   42/59/5638 120   07/15/2012 139 (H)     LDL Calculated (mg/dL)   Date Value   75/64/3329 129   07/21/2014 138       (goal LDL is <100)   AST (U/L)   Date Value   02/24/2015 19     ALT (U/L)   Date Value   02/24/2015 19     BUN (mg/dL)   Date Value   51/88/4166 13     BP Readings from Last 3 Encounters:   02/19/21 134/74   06/02/20 118/68   03/17/20 (!) 145/75          (goal 120/80)    All Future Testing planned in [facility]  Lab Frequency Next Occurrence               Patient Active Problem List:     Hay fever     DM (diabetes mellitus) (HCC)     Depression     Urinary  incontinence     Hyperlipidemia     HTN (hypertension)     Anxiety     Generalized headaches     OA (osteoarthritis)

## 2022-02-07 ENCOUNTER — Encounter

## 2022-02-07 MED ORDER — VICTOZA 18 MG/3ML SC SOPN
18 MG/3ML | SUBCUTANEOUS | 5 refills | Status: AC
Start: 2022-02-07 — End: 2023-01-23

## 2022-02-07 NOTE — Telephone Encounter (Signed)
Last Visit Date: 02/19/2021   Next Visit Date: 03/18/2022

## 2022-03-01 ENCOUNTER — Encounter

## 2022-03-01 MED ORDER — PRAVASTATIN SODIUM 40 MG PO TABS
40 MG | ORAL_TABLET | Freq: Every evening | ORAL | 3 refills | Status: AC
Start: 2022-03-01 — End: 2023-03-03

## 2022-03-01 NOTE — Telephone Encounter (Signed)
Last visit:   Last Med refill:   Does patient have enough medication for 72 hours: No:     Next Visit Date:  Future Appointments   Date Time Provider Department Center   03/18/2022  3:30 PM Nolon Stalls, DO [place] [facility]       Health Maintenance   Topic Date Due    Hepatitis C screen  Never done    Colorectal Cancer Screen  Never done    Shingles vaccine (1 of 2) Never done    Diabetic foot exam  02/01/2015    Diabetic retinal exam  10/29/2015    Diabetic Alb to Cr ratio (uACR) test  05/30/2016    Pneumococcal 65+ years Vaccine (2 - PPSV23 if available, else PCV20) 06/04/2016    Lipids  08/27/2016    GFR test (Diabetes, CKD 3-4, OR last GFR 15-59)  04/20/2017    COVID-19 Vaccine (2 - Moderna series) 06/21/2020    Annual Wellness Visit (AWV)  06/03/2021    Depression Monitoring  02/19/2022    Breast cancer screen  03/22/2022    A1C test (Diabetic or Prediabetic)  04/16/2022    Flu vaccine (Season Ended) 05/28/2022    DEXA (modify frequency per FRAX score)  Addressed    Hepatitis A vaccine  Aged Out    Hib vaccine  Aged Out    Meningococcal (ACWY) vaccine  Aged Out       Hemoglobin A1C (%)   Date Value   04/16/2021 6.5   11/29/2019 7.1   01/25/2016 6.9             ( goal A1C is < 7)   Microalb/Crt. Ratio (mcg/mg creat)   Date Value   05/31/2013 11     LDL Cholesterol (mg/dL)   Date Value   73/22/0254 120   07/15/2012 139 (H)     LDL Calculated (mg/dL)   Date Value   27/03/2375 129   07/21/2014 138       (goal LDL is <100)   AST (U/L)   Date Value   02/24/2015 19     ALT (U/L)   Date Value   02/24/2015 19     BUN (mg/dL)   Date Value   28/31/5176 13     BP Readings from Last 3 Encounters:   02/19/21 134/74   06/02/20 118/68   03/17/20 (!) 145/75          (goal 120/80)    All Future Testing planned in [facility]  Lab Frequency Next Occurrence               Patient Active Problem List:     Hay fever     DM (diabetes mellitus) (HCC)     Depression     Urinary incontinence     Hyperlipidemia     HTN (hypertension)      Anxiety     Generalized headaches     OA (osteoarthritis)           Please address the medication refill and close the encounter.  If I can be of assistance, please route to the applicable pool.      Thank you.

## 2022-03-18 ENCOUNTER — Ambulatory Visit
Admit: 2022-03-18 | Discharge: 2022-03-18 | Payer: MEDICARE | Attending: Student in an Organized Health Care Education/Training Program | Primary: Family Medicine

## 2022-03-18 DIAGNOSIS — E039 Hypothyroidism, unspecified: Secondary | ICD-10-CM

## 2022-03-18 DIAGNOSIS — F419 Anxiety disorder, unspecified: Secondary | ICD-10-CM

## 2022-03-18 LAB — POCT GLYCOSYLATED HEMOGLOBIN (HGB A1C): Hemoglobin A1C: 6.3 %

## 2022-03-18 MED ORDER — LEVOTHYROXINE SODIUM 150 MCG PO TABS
150 MCG | ORAL_TABLET | ORAL | 3 refills | Status: AC
Start: 2022-03-18 — End: 2023-05-06

## 2022-03-18 MED ORDER — LORAZEPAM 1 MG PO TABS
1 MG | ORAL_TABLET | Freq: Three times a day (TID) | ORAL | 0 refills | Status: DC | PRN
Start: 2022-03-18 — End: 2022-06-17

## 2022-03-18 NOTE — Progress Notes (Unsigned)
[facility]    Family Medicine Residency Program - Outpatient Note      Subjective:      Brandy [name] is a 76 y.o. female  presented to the office on 03/18/22 with complaints of: Medication refill    This is a 76 year old female with a history of hypothyroidism, anxiety diabetes is coming in today for medication refill.    Patient tried Celexa and Prozac for her anxiety/panic attacks, patient said that her symptoms are somewhat controlled but she still have struggling with insomnia.  Patient do have history of obstructive sleep apnea and was supposed to get a CPAP but since she is claustrophobic she is not using a CPAP machine, patient lost follow-up with the pulmonologist.  Patient currently do mention that there are days where she feels exhausted without any energy to do anything.  Patient is not suicidal or homicidal as of this time.  Patient has not established with a psychiatrist, patient is going using Ativan as needed.      Right arm pain:  Patient is a right-hand-dominant is experiencing some trouble with the right shoulder, patient do state that when she ever tried to sleep on the right side it to worsen the symptoms, patient was evaluated and she was told that she might have enlarged lymph node.        Review of systems (Except what is mentioned in the HPI)  CONSTITUTIONAL: Negative  RESPIRATORY: Negative  CARDIOVASCULAR: Negative  GASTROINTESTINAL: Negative  GENITOURINARY: Negative   MUSCULOSKELETAL: Negative  NEUROLOGICAL: Negative  BEHAVIOR/PSYCH: Negative      Objective:      Vitals:    03/18/22 1521   BP: (!) 141/78   Pulse: 60       General Appearance - Alert and oriented x 3  HEENT - No obvious deformity  Lungs - Bilateral good air entry , no wheezes or rales  Cardiovascular - Regular rate and rhythm. No murmur  Abdomen - Soft and nontender  Neurologic - No new focal motor or sensory deficits  Skin - No bruising or bleeding on exposed skin area  MSK -range of motion and muscle  strength is intact, no axillary lymph nodes appreciated, no supraclavicular lymphadenopathy  Psych - normal affect       Assessment:        ICD-10-CM    1. Type 2 diabetes mellitus without complication, without long-term current use of insulin (HCC)  E11.9 POCT glycosylated hemoglobin (Hb A1C)      2. Hypothyroidism, unspecified type  E03.9 levothyroxine (SYNTHROID) 150 MCG tablet     TSH With Reflex Ft4     Lipid Panel     CBC with Auto Differential     Comprehensive Metabolic Panel      3. Anxiety  F41.9 LORazepam (ATIVAN) 1 MG tablet          Plan:    Fatigue/exhaustion multifactorial due to noncompliance of CPAP machine, hypothyroidism, untreated anxiety/panic attacks.  We will do basic CMP/CBC to recheck the thyroid function.  Discussed with the patient in detail that she should speak with the pulmonologist about claustrophobia and CPAP machine mask, patient is currently taking Ativan which can also contribute towards current symptoms.  We will I will refill refill Ativan for 7 days, will route the patient next refill to the PCP.  Reviewed PDMP.  Will defer increasing Prozac or tapering/discontinue Prozac to the primary care physician.  Did discuss with the patient that benzodiazepines will not  be a good option for her insomnia considering CPAP, patient is elevated but is not open to the idea of trying any other medication including trazodone.  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  Could not find any lymphadenopathy or any swelling as of this time.      Return in about 3 months (around 06/18/2022) for Lab Work, Consult F/U.       Requested Prescriptions     Signed Prescriptions Disp Refills    levothyroxine (SYNTHROID) 150 MCG tablet 90 tablet 3     Sig: TAKE 1 TABLET BY MOUTH DAILY    LORazepam (ATIVAN) 1 MG tablet 60 tablet 0     Sig: Take 1 tablet by mouth every 8  hours as needed for Anxiety (for anxiety) for up to 7 days. Max Daily Amount: 3 mg       Medications Discontinued During This Encounter   Medication Reason    levothyroxine (SYNTHROID) 150 MCG tablet REORDER    LORazepam (ATIVAN) 1 MG tablet REORDER       Chiquetta received counseling on the following healthy behaviors: nutrition, exercise and medication adherence    Discussed use, benefit, and side effects of prescribed medications.  Barriers to medication compliance addressed.      All patient questions answered.  Pt voiced understanding.       Disclaimer: Some oral of this note was transcribed using voice-recognition software.This may cause typographical errors occasionally. Although all effort is made to fix these errors, please do not hesitate to contact our office if there isany concern with the understanding of this note.    Connie Lasater Oletta Cohn  PGY-3  Family Medicine     03/18/2022  4:19 PM

## 2022-03-18 NOTE — Progress Notes (Signed)
Visit Information    Have you changed or started any medications since your last visit including any over-the-counter medicines, vitamins, or herbal medicines? no   Have you stopped taking any of your medications? Is so, why? -  no  Are you having any side effects from any of your medications? - yes - Prozac causing tiredness    Have you seen any other physician or provider since your last visit?  yes - [facility] 12/25/21   Have you had any other diagnostic tests since your last visit?  yes - XR/Dexa Scan 01/14/22   Have you been seen in the emergency room and/or had an admission in a hospital since we last saw you?  no   Have you had your routine dental cleaning in the past 6 months?  no     Do you have an active MyChart account? If no, what is the barrier?  Yes    Patient Care Team:  Nolon Stalls, DO as PCP - General (Family Medicine)  Nolon Stalls, DO as PCP - Empaneled Provider  Ike Bene, DO as Surgeon (Orthopedic Surgery)  Gena Fray, MD as Consulting Physician (Pulmonology)    Medical History Review  Past Medical, Family, and Social History reviewed and does contribute to the patient presenting condition    Health Maintenance   Topic Date Due    Hepatitis C screen  Never done    Colorectal Cancer Screen  Never done    Shingles vaccine (1 of 2) Never done    Diabetic foot exam  02/01/2015    Diabetic retinal exam  10/29/2015    Diabetic Alb to Cr ratio (uACR) test  05/30/2016    Pneumococcal 65+ years Vaccine (2 - PPSV23 if available, else PCV20) 06/04/2016    Lipids  08/27/2016    GFR test (Diabetes, CKD 3-4, OR last GFR 15-59)  04/20/2017    COVID-19 Vaccine (2 - Moderna series) 06/21/2020    Annual Wellness Visit (AWV)  06/03/2021    Depression Monitoring  02/19/2022    Breast cancer screen  03/22/2022    A1C test (Diabetic or Prediabetic)  04/16/2022    Flu vaccine (Season Ended) 05/28/2022    DEXA (modify frequency per FRAX score)  Addressed    Hepatitis A vaccine   Aged Out    Hib vaccine  Aged Out    Meningococcal (ACWY) vaccine  Aged Out

## 2022-03-18 NOTE — Progress Notes (Unsigned)
Attending Physician Statement  I  have discussed the care of Brandy [name] including pertinent history and exam findings with the resident. I agree with the assessment, plan and orders as documented by the resident.      BP (!) 141/78 (Site: Right Upper Arm, Position: Sitting, Cuff Size: Large Adult)   Pulse 60   Ht 5\' 6"  (1.676 m)   Wt 261 lb (118.4 kg)   BMI 42.13 kg/m    BP Readings from Last 3 Encounters:   03/18/22 (!) 141/78   02/19/21 134/74   06/02/20 118/68     Wt Readings from Last 3 Encounters:   03/18/22 261 lb (118.4 kg)   02/19/21 262 lb 9.6 oz (119.1 kg)   06/02/20 249 lb 4.8 oz (113.1 kg)          Diagnosis Orders   1. Type 2 diabetes mellitus without complication, without long-term current use of insulin (HCC)  POCT glycosylated hemoglobin (Hb A1C)      2. Hypothyroidism, unspecified type  levothyroxine (SYNTHROID) 150 MCG tablet    TSH With Reflex Ft4    Lipid Panel    CBC with Auto Differential    Comprehensive Metabolic Panel      3. Anxiety  LORazepam (ATIVAN) 1 MG tablet          08/02/20, MD 03/18/2022 4:56 PM

## 2022-04-09 ENCOUNTER — Telehealth

## 2022-04-09 NOTE — Telephone Encounter (Signed)
PC from pt stating that she hasn't gotten her 7 day script of Ativan yet from her visit with Dr. Ninfa Linden on 03/18/22. Writer confirmed that we did get a receipt confirmation from the pharmacy on 03/18/22 that they did receive the e-scribe. Pt then stated that she just got off the phone with the pharmacy and they don't have it and haven't gotten the Prior Auth for it. Writer let pt know that according to the note in the chart it did not need one as it stated: "Closed - PA not required for patient/medication"   Disp Refills Start End    LORazepam (ATIVAN) 1 MG tablet 60 tablet 0 03/18/2022 03/25/2022    Sig - Route: Take 1 tablet by mouth every 8 hours as needed for Anxiety (for anxiety) for up to 7 days. Max Daily Amount: 3 mg - Oral    Sent to pharmacy as: LORazepam 1 MG Oral Tablet (ATIVAN)    E-Prescribing Status: Receipt confirmed by pharmacy (03/18/2022  4:18 PM EDT)    Prior authorization: Closed - Prior Authorization not required for patient/medication    Writer did note that script was sent by a resident physician so it may be a problem with PEC[employer] certification and we can get that resent over by an attending provider to alleviate that problem. Caller stated that was fine but would like a conversation with "Britt Boozer" if she could as she was really unsatisfied with her visit with the resident and does not ever want to see him again as he made her feel like an addict and that's not at all what she is, they just don't understand her treatment plan and medical history. Pt also stated she was unhappy by the students recommendation that she see a therapist or psychiatrist as she is not crazy and never been crazy so that was just out of line. Writer apologized for any miscommunication there but not being in the room at that point he couldn't state one way or another how that was intended but he is sorry if it came across in a negative manner. Writer let pt know he would route this message to Dr. Melba Coon and mark not to be  seen by Dr. Ninfa Linden in the future as well as see if we can get a new script signed under the attending signature, pharmacy and phone number confirmed. Pt expressed agreement to the plan as well as gratitude for writer's time and understanding to the plan of care from here forward so far.

## 2022-04-10 NOTE — Telephone Encounter (Signed)
PA received for Ativan through CoverMyMeds around 5pm on 04/09/22. Writer started process before realizing script was written for 60 tabs to be taken "one tab every 8 hours as needed for anxiety for 7 days" so that may be the problem with them not signing the script as the sig and quantity do not align as it would make for more than a 7 days supply. Writer saved PA information into form but did not submit to insurance yet, Clinical research associate recommends sending over a new script with appropriate quantity for the 7 days supply and seeing if that will be filled for pt. Pharmacy was confirmed in prior call and medication is pended. Please review and advise on your preferred course of action.

## 2022-04-11 NOTE — Telephone Encounter (Signed)
I discussed with the patient that I will provide limited supply of Ativan as I donot agree with the care plan and will let the PCP know about the medication,   PCP is under the impression that patient ativan is managed by psychiatrist which does not seems to be the case.     Reviewed PDMP and no refill or prescription is seen recently but patient mentioned that she is on the medication for years and PCP is managing it, on further review in Epic system previous refills were provided in 2014, 2016 and one ativan say historical provider which is not accessed through our system which is were the limitation in our EMR accessing other records.   Since patient was not happy with my plan, I have nothing else to add, there is discrepancy between what patient is sharing and EMR, I will let the PCP discuss it in details again.   I will only sign 7 day supply but  and then she can discuss further management with PCP.

## 2022-04-12 NOTE — Telephone Encounter (Signed)
PC to pt's pharmacy ([facility] in [place]) as requested, Pharmacist states that they have never filled Ativan for the patient and confirmed that she did pick up her 90 day supply of Prozac on April 3rd. Writer also reached out to [facility] as it is the only other one listed in the patient chart and they have never filled any prescriptions for the pt.     Would you like [place] to schedule a f/u with the PCP for further evaluation and a more proper continuation of care?

## 2022-04-23 NOTE — Telephone Encounter (Signed)
error 

## 2022-04-23 NOTE — Telephone Encounter (Signed)
Patient called back stating she still hasn't received her medication. She said the script was sent to the pharmacy incorrectly. Writer reviewed previous messages with patient, and she said it's lies. She said [facility] in [place] has filled this medication and that she stopped taking the Prozac. Patient said whatever, tell Dr. Lupita Shallen Luedke says thanks.

## 2022-04-29 NOTE — Telephone Encounter (Signed)
-----   Message from [name] sent at 04/29/2022  2:08 PM EDT -----  Regarding: Me  Contact: [phone]  The last time I was in I saw this other Dr. and he made me feel like and addict and said I should get my Ativan from my Psych.  I am not an addict or am I crazy Weston Brass.   I was told I was seeing you and no one told me otherwise.   I only take this med. when I am anxious and if have been living my life the last  8 months you would understand.  It has been hell.  Cal had a stroke in November and spent 1 week in [facility] and 2 weeks in rehab.  He is doing better now but I need to talk to you about things, not some guy that made me feel not so happy.  I know you precept but if I can't see you then I guess I won't come.  I ask Joe if he would ask you to call me but he must have forgot, oh well    PJ

## 2022-05-02 ENCOUNTER — Encounter

## 2022-05-02 NOTE — Telephone Encounter (Signed)
E-scribe request for med refills. Please review and e-scribe if applicable.     Last Visit Date:  03/18/22  Next Visit Date:  06/07/2022    Hemoglobin A1C (%)   Date Value   03/18/2022 6.3   04/16/2021 6.5   11/29/2019 7.1             ( goal A1C is < 7)   Microalb/Crt. Ratio (mcg/mg creat)   Date Value   05/31/2013 11     LDL Cholesterol (mg/dL)   Date Value   68/34/1962 120     LDL Calculated (mg/dL)   Date Value   22/97/9892 129       (goal LDL is <100)   AST (U/L)   Date Value   02/24/2015 19     ALT (U/L)   Date Value   02/24/2015 19     BUN (mg/dL)   Date Value   11/94/1740 13     BP Readings from Last 3 Encounters:   03/18/22 (!) 141/78   02/19/21 134/74   06/02/20 118/68          (goal 120/80)        Patient Active Problem List:     Hay fever     DM (diabetes mellitus) (HCC)     Depression     Urinary incontinence     Hyperlipidemia     HTN (hypertension)     Anxiety     Generalized headaches     OA (osteoarthritis)      ----Brandy [name]

## 2022-05-03 MED ORDER — DICYCLOMINE HCL 10 MG PO CAPS
10 MG | ORAL_CAPSULE | ORAL | 5 refills | Status: AC
Start: 2022-05-03 — End: 2023-05-08

## 2022-06-07 ENCOUNTER — Ambulatory Visit: Admit: 2022-06-07 | Discharge: 2022-06-07 | Payer: MEDICARE | Attending: Family Medicine | Primary: Family Medicine

## 2022-06-07 DIAGNOSIS — G44019 Episodic cluster headache, not intractable: Secondary | ICD-10-CM

## 2022-06-07 NOTE — Progress Notes (Signed)
Visit Information    Have you changed or started any medications since your last visit including any over-the-counter medicines, vitamins, or herbal medicines? no   Are you having any side effects from any of your medications? -  no  Have you stopped taking any of your medications? Is so, why? -  no    Have you seen any other physician or provider since your last visit? No  Have you had any other diagnostic tests since your last visit? No  Have you been seen in the emergency room and/or had an admission to a hospital since we last saw you? No  Have you had your routine dental cleaning in the past 6 months? no    Have you activated your MyChart account? If not, what are your barriers? Yes     Patient Care Team:  Nolon Stalls, DO as PCP - General (Family Medicine)  Nolon Stalls, DO as PCP - Empaneled Provider  Ike Bene, DO as Surgeon (Orthopedic Surgery)  Gena Fray, MD as Consulting Physician (Pulmonology)    Medical History Review  Past Medical, Family, and Social History reviewed and does not contribute to the patient presenting condition    Health Maintenance   Topic Date Due    Hepatitis C screen  Never done    Colorectal Cancer Screen  Never done    Shingles vaccine (1 of 2) Never done    Diabetic foot exam  02/01/2015    Diabetic retinal exam  10/29/2015    Diabetic Alb to Cr ratio (uACR) test  05/30/2016    Pneumococcal 65+ years Vaccine (2 - PPSV23 if available, else PCV20) 06/04/2016    Lipids  08/27/2016    GFR test (Diabetes, CKD 3-4, OR last GFR 15-59)  04/20/2017    COVID-19 Vaccine (2 - Booster for Moderna series) 07/19/2020    Annual Wellness Visit (AWV)  06/03/2021    Breast cancer screen  03/22/2022    Flu vaccine (1) 05/28/2022    A1C test (Diabetic or Prediabetic)  03/19/2023    Depression Monitoring  03/19/2023    DEXA (modify frequency per FRAX score)  Addressed    Hepatitis A vaccine  Aged Out    Hib vaccine  Aged Out    Meningococcal (ACWY) vaccine  Aged Out

## 2022-06-07 NOTE — Patient Instructions (Signed)
Thank you for letting us take care of you today. We hope all your questions were addressed. If a question was overlooked or something else comes to mind after you return home, please contact a member of your Care Team listed below.      Your Care Team at [facility] is Team #  Nicholas Espinoza, (Faculty)  Farah Khan (Resident)  Mashrur Moyeed (Resident)  Farah Sharieh (Resident)   Menia Missak (Resident)  Ahmad Javed (Resident)  Brittany Posce., RMA  Jeanette Wells., RMA  Dawn Sutton, RMA  Michelle Studer, CMA  Joseph Fadell, RMA  Julie Thomas, CMA  Jessica Gonzales, CMA  Parthiv Mucci, RMA  LaJoyce (LJ) Hutton, RMA (Clinical Practice Manager)  Lisa McIntyre, RPH (Clinical Pharmacist)     Office phone number: [phone]    If you need to get in right away due to illness, please be advised we have "Same Day" appointments available Monday-Friday. Please call us at [phone] option #3 to schedule your "Same Day" appointment.

## 2022-06-07 NOTE — Progress Notes (Signed)
Chk up  Unhappy with Dr Lonell Grandchild - called patient an "addict" for requesting a short refill of Ativan.  Patient states she does not depend on Ativan but needs it periodically for situational anxiety such as getting an MRI which she is due for soon.      Problem  DM-using Victoza (A1c improved significantly over 2 years)  HTN-stable  Anxiety-periodic  Headaches - Dr Benna Dunks (gabapentin, Topamax)      Negative for:     Worry / mood complaints  Headache  Dizziness  Visual Disturbance  Hearing Changes  Nasal / sinus Symptoms  Mouth / tooth symptom, pain  Throat pain  Difficulty swallowing  Neck pain  Chest discomfort  Cough  SOB  N/V/D/C  Pelvic area discomfort  Bladder / voiding discomfort  Bowel complaints  MSk complaints   Numbness/tingling/abnormal sensations   Edema / Leg swelling  Dizziness  Fatigue  Bleeding   Skin    Pertinent Pos: See HPI -right axilla-tenderness, previously reported and Dr. Ninfa Linden evaluated.    Vitals:    06/07/22 1048   BP: 106/65   Pulse: 71       Alert and oriented to PPT  NAD    HEENT - neg  Neck - no bruits, no lymphadenopathy    Chest  HRRR w/o murmer  LCTAB no wheezes / rhonchi  Extremities - 0+ PTE    Gait / Station - stable, no dysequilibrium, uniform pace, no assist device, cane.     Diagnosis Orders   1. Episodic cluster headache, not intractable  External Referral To Neurology      2. Encounter for screening mammogram for malignant neoplasm of breast  MAM DIGITAL SCREEN W OR WO CAD BILATERAL      3. 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 Screening for colorectal cancer  Fecal DNA Colorectal cancer screening (Cologuard)          Plan:  1.)  a1c, microalbumin  2.)  Mammogram, Cologuard  3.)  Patient declined Pneumovax 23  4.)  Neurology referral-patient request [facility] as needed

## 2022-06-14 ENCOUNTER — Encounter

## 2022-06-14 NOTE — Telephone Encounter (Signed)
Last visit: 06/07/22  Last Med refill:   Does patient have enough medication for 72 hours: No:     Next Visit Date:  Future Appointments   Date Time Provider Department Center   08/09/2022 10:30 AM Nolon Stalls, DO [id number]       Health Maintenance   Topic Date Due    Hepatitis C screen  Never done    Colorectal Cancer Screen  Never done    Shingles vaccine (1 of 2) Never done    Diabetic foot exam  02/01/2015    Diabetic retinal exam  10/29/2015    Diabetic Alb to Cr ratio (uACR) test  05/30/2016    Pneumococcal 65+ years Vaccine (2 - PPSV23 if available, else PCV20) 06/04/2016    Lipids  08/27/2016    GFR test (Diabetes, CKD 3-4, OR last GFR 15-59)  04/20/2017    COVID-19 Vaccine (2 - Booster for Moderna series) 07/19/2020    Annual Wellness Visit (AWV)  06/03/2021    Breast cancer screen  03/22/2022    Flu vaccine (1) 05/28/2022    A1C test (Diabetic or Prediabetic)  03/19/2023    Depression Monitoring  06/08/2023    DEXA (modify frequency per FRAX score)  Addressed    Hepatitis A vaccine  Aged Out    Hib vaccine  Aged Out    Meningococcal (ACWY) vaccine  Aged Out       Hemoglobin A1C (%)   Date Value   03/18/2022 6.3   04/16/2021 6.5   11/29/2019 7.1             ( goal A1C is < 7)   No components found for: LABMICR  LDL Cholesterol (mg/dL)   Date Value   82/95/6213 120   07/15/2012 139 (H)     LDL Calculated (mg/dL)   Date Value   08/65/7846 129   07/21/2014 138       (goal LDL is <100)   AST (U/L)   Date Value   02/24/2015 19     ALT (U/L)   Date Value   02/24/2015 19     BUN (mg/dL)   Date Value   96/29/5284 13     BP Readings from Last 3 Encounters:   06/07/22 106/65   03/18/22 (!) 141/78   02/19/21 134/74          (goal 120/80)    All Future Testing planned in [facility]  Lab Frequency Next Occurrence   TSH With Reflex Ft4 Once 03/18/2022   Lipid Panel Once 03/18/2022   CBC with Auto Differential Once 03/18/2022   Comprehensive Metabolic Panel Once 03/18/2022   MAM DIGITAL SCREEN W OR WO CAD  BILATERAL Once 06/07/2022   Hemoglobin A1C Once 06/07/2022   Microalbumin, Ur Once 06/07/2022               Patient Active Problem List:     Hay fever     DM (diabetes mellitus) (HCC)     Depression     Urinary incontinence     Hyperlipidemia     HTN (hypertension)     Anxiety     Generalized headaches     OA (osteoarthritis)

## 2022-06-17 MED ORDER — LORAZEPAM 1 MG PO TABS
1 MG | ORAL_TABLET | Freq: Three times a day (TID) | ORAL | 0 refills | Status: AC | PRN
Start: 2022-06-17 — End: 2022-06-24

## 2022-06-17 NOTE — Telephone Encounter (Signed)
Recent face-to-face office visit on June 07, 2022.  Patient and I had discussion regarding request for lorazepam based on her request with previous physician Dr. Lonell Grandchild, MD.    Patient states she periodically uses it and has not abused it in the past.  Has situational anxiety particularly when it comes to getting procedures done such as MRI etc.    PDMP reviewed today-shows no misuse or abuse of controlled substance medication.    Short supply approved.  Patient will follow-up with me in 3 to 6 months.

## 2022-06-24 LAB — MICROALBUMIN, UR
Creatinine, Ur: 108.63 mg/dL
Microalb, Ur: 0.7

## 2022-06-24 LAB — HEMOGLOBIN A1C
Estimated Avg Glucose: 140
Hemoglobin A1C: 6.5 %

## 2022-06-27 ENCOUNTER — Encounter

## 2022-06-27 MED ORDER — IRBESARTAN 150 MG PO TABS
150 MG | ORAL_TABLET | ORAL | 3 refills | Status: AC
Start: 2022-06-27 — End: ?

## 2022-06-27 MED ORDER — PANTOPRAZOLE SODIUM 40 MG PO TBEC
40 MG | ORAL_TABLET | ORAL | 3 refills | Status: AC
Start: 2022-06-27 — End: ?

## 2022-06-27 NOTE — Telephone Encounter (Signed)
Last Visit Date: 06/07/2022   Next Visit Date: 08/09/2022

## 2022-07-04 LAB — FECAL DNA COLORECTAL CANCER SCREENING (COLOGUARD): FIT-DNA (Cologuard): NEGATIVE

## 2022-07-08 NOTE — Telephone Encounter (Signed)
Writer called patient for reminder called on her mammo to get done patien states she has appt at [facility] center.

## 2022-07-25 NOTE — Telephone Encounter (Signed)
Patient contacted office in regards to referral for neurology patient stated office Dr. Fulton Mole office are not accepting patient at this time. Patient would like to go to [facility] on [address]. Fax number is [phone]. Writer faxed over referral, demographics, and last office note. Writer informed patient to contact neurology office to schedule appointment in about a hour. Patient voiced understanding.

## 2022-08-09 ENCOUNTER — Encounter: Payer: MEDICARE | Attending: Family Medicine | Primary: Family Medicine

## 2022-09-27 ENCOUNTER — Telehealth

## 2022-09-27 NOTE — Telephone Encounter (Signed)
Patient in stating that she has finished her diagnostic mammogram this morning

## 2022-10-09 NOTE — Progress Notes (Signed)
Summary: Post-biopsy Discharge Instructions    Formatting of this note might be different from the original.  14:30 Met with patient and her husband following stereotactic breast biopsy to review post-biopsy care instructions.  Reviewed written instructions and answered related questions.  Encouraged to call if any concerns related to biopsy site/breast should occur.  Provided contact numbers and office hours.  Written copy of care instructions provided to patient.  Voices understanding of all information reviewed.  Electronically signed by Sharlot Gowda, RN at 10/09/2022  3:08 PM EST

## 2022-10-14 NOTE — Telephone Encounter (Signed)
Formatting of this note might be different from the original.  Call placed to patient to check status following recent breast biopsy.  Patient reports some bruising and tenderness noted. She was encouraged to contact the [facility] if she develops any new problems at biopsy site. Biopsy results and recommendations reviewed with the patient, she voiced understanding.  Electronically signed by Wayland Denis, RN at 10/14/2022  1:27 PM EST

## 2022-10-25 ENCOUNTER — Ambulatory Visit: Admit: 2022-10-25 | Discharge: 2022-10-25 | Payer: MEDICARE | Attending: Family Medicine | Primary: Family Medicine

## 2022-10-25 DIAGNOSIS — Z0000 Encounter for general adult medical examination without abnormal findings: Secondary | ICD-10-CM

## 2022-10-25 NOTE — Patient Instructions (Addendum)
Thank you for letting us take care of you today. We hope all your questions were addressed. If a question was overlooked or something else comes to mind after you return home, please contact a member of your Care Team listed below.      Your Care Team at [place] is Team #  Karyl Kinnier, (Faculty)  Placido Sou (Resident)  Lily Lovings (Resident)  Audrie Gallus (Resident)   Yates Decamp (Resident)  Jasper Loser (Resident)  Wilford Corner., RMA  Miguel Dibble., [place], [place], [place], RMA  Estil Daft, CMA  Lowella Curb, CMA  Dalia Heading, RMA  LaJoyce Roni Bread) [place], [place] (Clinical Practice Manager)  Eileen Stanford, [facility] (Clinical Pharmacist)     Office phone number: [phone]    If you need to get in right away due to illness, please be advised we have "Same Day" appointments available Monday-Friday. Please call us at [phone] option #3 to schedule your "Same Day" appointment.          Preventing Falls: Care Instructions  Injuries and health problems such as trouble walking or poor eyesight can increase your risk of falling. So can some medicines. But there are things you can do to help prevent falls. You can exercise to get stronger. You can also arrange your home to make it safer.    Talk to your doctor about the medicines you take. Ask if any of them increase the risk of falls and whether they can be changed or stopped.   Try to exercise regularly. It can help improve your strength and balance. This can help lower your risk of falling.     Practice fall safety and prevention.    Wear low-heeled shoes that fit well and give your feet good support. Talk to your doctor if you have foot problems that make this hard.  Carry a cellphone or wear a medical alert device that you can use to call for help.  Use stepladders instead of chairs to reach high objects. Don't climb if you're at risk for falls. Ask for help, if needed.  Wear the correct eyeglasses, if  you need them.    Make your home safer.    Remove rugs, cords, clutter, and furniture from walkways.  Keep your house well lit. Use night-lights in hallways and bathrooms.  Install and use sturdy handrails on stairways.  Wear nonskid footwear, even inside. Don't walk barefoot or in socks without shoes.    Be safe outside.    Use handrails, curb cuts, and ramps whenever possible.  Keep your hands free by using a shoulder bag or backpack.  Try to walk in well-lit areas. Watch out for uneven ground, changes in pavement, and debris.  Be careful in the winter. Walk on the grass or gravel when sidewalks are slippery. Use de-icer on steps and walkways. Add non-slip devices to shoes.    Put grab bars and nonskid mats in your shower or tub and near the toilet. Try to use a shower chair or bath bench when bathing.   Get into a tub or shower by putting in your weaker leg first. Get out with your strong side first. Have a phone or medical alert device in the bathroom with you.   Where can you learn more?  Go to https://www.bennett.info/ and enter G117 to learn more about "Preventing Falls: Care Instructions."  Current as of: July 18, 2023Content Version: 13.9   2006-2023 Healthwise, Incorporated.  Care instructions adapted under license by [facility]. If you have questions about a medical condition or this instruction, always ask your healthcare professional. [place] any warranty or liability for your use of this information.           Learning About Stress  What is stress?     Stress is your body's response to a hard situation. Your body can have a physical, emotional, or mental response. Stress is a fact of life for most people, and it affects everyone differently. What causes stress for you may not be stressful for someone else.  A lot of things can cause stress. You may feel stress when you go on a job interview, take a test, or run a race. This kind of short-term  stress is normal and even useful. It can help you if you need to work hard or react quickly. For example, stress can help you finish an important job on time.  Long-term stress is caused by ongoing stressful situations or events. Examples of long-term stress include long-term health problems, ongoing problems at work, or conflicts in your family. Long-term stress can harm your health.  How does stress affect your health?  When you are stressed, your body responds as though you are in danger. It makes hormones that speed up your heart, make you breathe faster, and give you a burst of energy. This is called the fight-or-flight stress response. If the stress is over quickly, your body goes back to normal and no harm is done.  But if stress happens too often or lasts too long, it can have bad effects. Long-term stress can make you more likely to get sick, and it can make symptoms of some diseases worse. If you tense up when you are stressed, you may develop neck, shoulder, or low back pain. Stress is linked to high blood pressure and heart disease.  Stress also harms your emotional health. It can make you moody, tense, or depressed. Your relationships may suffer, and you may not do well at work or school.  What can you do to manage stress?  You can try these things to help manage stress:   Do something active. Exercise or activity can help reduce stress. Walking is a great way to get started. Even everyday activities such as housecleaning or yard work can help.  Try yoga or tai chi. These techniques combine exercise and meditation. You may need some training at first to learn them.  Do something you enjoy. For example, listen to music or go to a movie. Practice your hobby or do volunteer work.  Meditate. This can help you relax, because you are not worrying about what happened before or what may happen in the future.  Do guided imagery. Imagine yourself in any setting that helps you feel calm. You can use online videos,  books, or a teacher to guide you.  Do breathing exercises. For example:  From a standing position, bend forward from the waist with your knees slightly bent. Let your arms dangle close to the floor.  Breathe in slowly and deeply as you return to a standing position. Roll up slowly and lift your head last.  Hold your breath for just a few seconds in the standing position.  Breathe out slowly and bend forward from the waist.  Let your feelings out. Talk, laugh, cry, and express anger when you need to. Talking with supportive friends or family, a Social worker, or a faith leader about your feelings  is a healthy way to relieve stress. Avoid discussing your feelings with people who make you feel worse.  Write. It may help to write about things that are bothering you. This helps you find out how much stress you feel and what is causing it. When you know this, you can find better ways to cope.  What can you do to prevent stress?  You might try some of these things to help prevent stress:  Manage your time. This helps you find time to do the things you want and need to do.  Get enough sleep. Your body recovers from the stresses of the day while you are sleeping.  Get support. Your family, friends, and community can make a difference in how you experience stress.  Limit your news feed. Avoid or limit time on social media or news that may make you feel stressed.  Do something active. Exercise or activity can help reduce stress. Walking is a great way to get started.  Where can you learn more?  Go to https://www.bennett.info/ and enter N032 to learn more about "Learning About Stress."  Current as of: February 26, 2023Content Version: 13.9   2006-2023 Healthwise, Incorporated.   Care instructions adapted under license by [facility]. If you have questions about a medical condition or this instruction, always ask your healthcare professional. [place] any warranty or liability  for your use of this information.           Starting a Weight Loss Plan: Care Instructions  Overview     If you're thinking about losing weight, it can be hard to know where to start. Your doctor can help you set up a weight loss plan that best meets your needs. You may want to take a class on nutrition or exercise, or you could join a weight loss support group. If you have questions about how to make changes to your eating or exercise habits, ask your doctor about seeing a registered dietitian or an exercise specialist.  It can be a big challenge to lose weight. But you don't have to make huge changes at once. Make small changes, and stick with them. When those changes become habit, add a few more changes.  If you don't think you're ready to make changes right now, try to pick a date in the future. Make an appointment to see your doctor to discuss whether the time is right for you to start a plan.  Follow-up care is a key part of your treatment and safety. Be sure to make and go to all appointments, and call your doctor if you are having problems. It's also a good idea to know your test results and keep a list of the medicines you take.  How can you care for yourself at home?  Set realistic goals. Many people expect to lose much more weight than is likely. A weight loss of 5% to 10% of your body weight may be enough to improve your health.  Get family and friends involved to provide support. Talk to them about why you are trying to lose weight, and ask them to help. They can help by participating in exercise and having meals with you, even if they may be eating something different.  Find what works best for you. If you do not have time or do not like to cook, a program that offers meal replacement bars or shakes may be better for you. Or if you like to prepare meals, finding a plan  that includes daily menus and recipes may be best.  Ask your doctor about other health professionals who can help you achieve your  weight loss goals.  A dietitian can help you make healthy changes in your diet.  An exercise specialist or personal trainer can help you develop a safe and effective exercise program.  A counselor or psychiatrist can help you cope with issues such as depression, anxiety, or family problems that can make it hard to focus on weight loss.  Consider joining a support group for people who are trying to lose weight. Your doctor can suggest groups in your area.  Where can you learn more?  Go to https://www.bennett.info/ and enter U357 to learn more about "Starting a Weight Loss Plan: Care Instructions."  Current as of: September 20, 2023Content Version: 13.9   2006-2023 Healthwise, Incorporated.   Care instructions adapted under license by [facility]. If you have questions about a medical condition or this instruction, always ask your healthcare professional. [place] any warranty or liability for your use of this information.           A Healthy Heart: Care Instructions  Your Care Instructions     Coronary artery disease, also called heart disease, occurs when a substance called plaque builds up in the vessels that supply oxygen-rich blood to your heart muscle. This can narrow the blood vessels and reduce blood flow. A heart attack happens when blood flow is completely blocked. A high-fat diet, smoking, and other factors increase the risk of heart disease.  Your doctor has found that you have a chance of having heart disease. You can do lots of things to keep your heart healthy. It may not be easy, but you can change your diet, exercise more, and quit smoking. These steps really work to lower your chance of heart disease.  Follow-up care is a key part of your treatment and safety. Be sure to make and go to all appointments, and call your doctor if you are having problems. It's also a good idea to know your test results and keep a list of the medicines you take.  How  can you care for yourself at home?  Diet   Use less salt when you cook and eat. This helps lower your blood pressure. Taste food before salting. Add only a little salt when you think you need it. With time, your taste buds will adjust to less salt.    Eat fewer snack items, fast foods, canned soups, and other high-salt, high-fat, processed foods.    Read food labels and try to avoid saturated and trans fats. They increase your risk of heart disease by raising cholesterol levels.    Limit the amount of solid fat-butter, margarine, and shortening-you eat. Use olive, peanut, or canola oil when you cook. Bake, broil, and steam foods instead of frying them.    Eat a variety of fruit and vegetables every day. Dark green, deep orange, red, or yellow fruits and vegetables are especially good for you. Examples include spinach, carrots, peaches, and berries.    Foods high in fiber can reduce your cholesterol and provide important vitamins and minerals. High-fiber foods include whole-grain cereals and breads, oatmeal, beans, brown rice, citrus fruits, and apples.    Eat lean proteins. Heart-healthy proteins include seafood, lean meats and poultry, eggs, beans, peas, nuts, seeds, and soy products.    Limit drinks and foods with added sugar. These include candy, desserts, and soda pop.   Lifestyle  changes   If your doctor recommends it, get more exercise. Walking is a good choice. Bit by bit, increase the amount you walk every day. Try for at least 30 minutes on most days of the week. You also may want to swim, bike, or do other activities.    Do not smoke. If you need help quitting, talk to your doctor about stop-smoking programs and medicines. These can increase your chances of quitting for good. Quitting smoking may be the most important step you can take to protect your heart. It is never too late to quit.    Limit alcohol to 2 drinks a day for men and 1 drink a day for women. Too much alcohol can cause health  problems.    Manage other health problems such as diabetes, high blood pressure, and high cholesterol. If you think you may have a problem with alcohol or drug use, talk to your doctor.   Medicines   Take your medicines exactly as prescribed. Call your doctor if you think you are having a problem with your medicine.    If your doctor recommends aspirin, take the amount directed each day. Make sure you take aspirin and not another kind of pain reliever, such as acetaminophen (Tylenol).   When should you call for help?   Call 911 if you have symptoms of a heart attack. These may include:   Chest pain or pressure, or a strange feeling in the chest.    Sweating.    Shortness of breath.    Pain, pressure, or a strange feeling in the back, neck, jaw, or upper belly or in one or both shoulders or arms.    Lightheadedness or sudden weakness.    A fast or irregular heartbeat.   After you call 911, the operator may tell you to chew 1 adult-strength or 2 to 4 low-dose aspirin. Wait for an ambulance. Do not try to drive yourself.  Watch closely for changes in your health, and be sure to contact your doctor if you have any problems.  Where can you learn more?  Go to https://www.bennett.info/ and enter F075 to learn more about "A Healthy Heart: Care Instructions."  Current as of: June 25, 2023Content Version: 13.9   2006-2023 Healthwise, Incorporated.   Care instructions adapted under license by [facility]. If you have questions about a medical condition or this instruction, always ask your healthcare professional. [place] any warranty or liability for your use of this information.      Personalized Preventive Plan for Brandy [name] - 10/25/2022  Medicare offers a range of preventive health benefits. Some of the tests and screenings are paid in full while other may be subject to a deductible, co-insurance, and/or copay.    Some of these benefits include a  comprehensive review of your medical history including lifestyle, illnesses that may run in your family, and various assessments and screenings as appropriate.    After reviewing your medical record and screening and assessments performed today your provider may have ordered immunizations, labs, imaging, and/or referrals for you.  A list of these orders (if applicable) as well as your Preventive Care list are included within your After Visit Summary for your review.    Other Preventive Recommendations:    A preventive eye exam performed by an eye specialist is recommended every 1-2 years to screen for glaucoma; cataracts, macular degeneration, and other eye disorders.  A preventive dental visit is recommended every 6 months.  Try  to get at least 150 minutes of exercise per week or 10,000 steps per day on a pedometer .  Order or download the FREE "Exercise & Physical Activity: Your Everyday Guide" from [employer]. Call 670-158-0262 or search [employer] online.  You need 1200-1500 mg of calcium and 1000-2000 IU of vitamin D per day. It is possible to meet your calcium requirement with diet alone, but a vitamin D supplement is usually necessary to meet this goal.  When exposed to the sun, use a sunscreen that protects against both UVA and UVB radiation with an SPF of 30 or greater. Reapply every 2 to 3 hours or after sweating, drying off with a towel, or swimming.  Always wear a seat belt when traveling in a car. Always wear a helmet when riding a bicycle or motorcycle.

## 2022-10-25 NOTE — Progress Notes (Signed)
AWV  Not needing prozac - stopped using  Cal - 74    Needs right knee replacement (Rothhaas retired)    Sees [place], [place] ([facility])    Gabapentin 600 TID (neurologist for neuropathy)  Medicare Annual Wellness Visit    Brandy [name] is here for Medicare AWV (AWV)    Assessment & Plan   Medicare annual wellness visit, subsequent  Recommendations for Preventive Services Due: see orders and patient instructions/AVS.  Recommended screening schedule for the next 5-10 years is provided to the patient in written form: see Patient Instructions/AVS.     No follow-ups on file.     Subjective       Patient's complete Health Risk Assessment and screening values have been reviewed and are found in Flowsheets. The following problems were reviewed today and where indicated follow up appointments were made and/or referrals ordered.    Positive Risk Factor Screenings with Interventions:    Fall Risk:  Do you feel unsteady or are you worried about falling? : (!) yes  2 or more falls in past year?: no  Fall with injury in past year?: no     Interventions:    walker     Depression:  Depression Unable to Assess: Urgent/emergent situation  PHQ-2 Score: 2  PHQ-9 Total Score: 4    Interpretation:   1-4 = minimal  5-9 = mild  10-14 = moderate  15-19 = moderately severe  20-27 = severe          General HRA Questions:  Select all that apply: (!) Stress    Stress Interventions:  OVs       Weight and Activity:  Physical Activity: Unknown (10/25/2022)    Exercise Vital Sign     Days of Exercise per Week: 0 days     Minutes of Exercise per Session: Patient declined     On average, how many days per week do you engage in moderate to strenuous exercise (like a brisk walk)?: 0 days  Have you lost any weight without trying in the past 3 months?: No  Body mass index is 42.76 kg/m. (!) Abnormal  Obesity Interventions:  discussed             Safety:  Do you have working smoke detectors?: (!) No  Do you always fasten your  seatbelt when you are in a car?: (!) No  Interventions:  reviewed                     Objective   Vitals:    10/25/22 1348   BP: 119/67   Site: Left Upper Arm   Position: Sitting   Cuff Size: Large Adult   Pulse: 73   Weight: 120.1 kg (264 lb 12.8 oz)   Height: 1.676 m (5' 5.98")      Body mass index is 42.76 kg/m.               Allergies   Allergen Reactions    Cipro  [Ciprofloxacin Hcl] Other (See Comments)    Ciprofloxacin Itching     IV cipro only    Meperidine      Other reaction(s): Hallucinations    Meperidine Hcl      Other reaction(s): Unknown    Sulfacetamide     Tetanus Antitoxin      Other reaction(s): Other allergic reaction    Tetanus Toxoids      Prior to Visit Medications  Medication Sig Taking? Authorizing Provider   pantoprazole (PROTONIX) 40 MG tablet TAKE 1 TABLET BY MOUTH TWICE DAILY Yes Nolon Stalls, DO   irbesartan (AVAPRO) 150 MG tablet TAKE 1 TABLET BY MOUTH EVERY NIGHT Yes Nolon Stalls, DO   dicyclomine (BENTYL) 10 MG capsule TAKE 1 CAPSULE BY MOUTH FOUR TIMES DAILY Yes Nolon Stalls, DO   levothyroxine (SYNTHROID) 150 MCG tablet TAKE 1 TABLET BY MOUTH DAILY Yes Ahmad, Muhaddis E, MD   pravastatin (PRAVACHOL) 40 MG tablet TAKE 1 TABLET BY MOUTH EVERY EVENING Yes Nolon Stalls, DO   VICTOZA 18 MG/3ML SOPN SC injection ADMINISTER 1.2 MG UNDER THE SKIN DAILY Yes Melba Coon, Alcus Bradly G, DO   B-D ULTRAFINE III SHORT PEN 31G X 8 MM MISC USE ONCE DAILY AS DIRECTED Yes Nolon Stalls, DO   topiramate (TOPAMAX) 25 MG tablet Take 1 tablet by mouth 2 times daily Yes [provider]   gabapentin (NEURONTIN) 300 MG capsule Take 2 capsules by mouth 3 times daily. Yes [provider]   aspirin 81 MG tablet Take 1 tablet by mouth daily Yes [provider]   gabapentin (NEURONTIN) 100 MG capsule Take 1 capsule by mouth 3 times daily  Patient not taking: Reported on 10/25/2022  Laurena Slimmer, APRN - CNP       CareTeam (Including outside  providers/suppliers regularly involved in providing care):   Patient Care Team:  Nolon Stalls, DO as PCP - General (Family Medicine)  Nolon Stalls, DO as PCP - Empaneled Provider  Rothhaas, Ezra Sites, DO as Surgeon (Orthopedic Surgery)  Gena Fray, MD as Consulting Physician (Pulmonology)     Reviewed and updated this visit:  Tobacco  Allergies  Meds  Med Hx  Surg Hx  Soc Hx  Fam Hx

## 2022-10-25 NOTE — Progress Notes (Signed)
Visit Information    Have you changed or started any medications since your last visit including any over-the-counter medicines, vitamins, or herbal medicines? no   Have you stopped taking any of your medications? Is so, why? -  no  Are you having any side effects from any of your medications? - no    Have you seen any other physician or provider since your last visit?  yes - Neurology   Have you had any other diagnostic tests since your last visit?  yes - Labs, Mammo   Have you been seen in the emergency room and/or had an admission in a hospital since we last saw you?  no   Have you had your routine dental cleaning in the past 6 months?  no     Do you have an active MyChart account? If no, what is the barrier?  Yes    Patient Care Team:  Hetty Ely, DO as PCP - General (Family Medicine)  Hetty Ely, DO as PCP - Empaneled Provider  Rothhaas, Toribio Harbour, DO as Surgeon (Orthopedic Surgery)  Peterson Ao, MD as Consulting Physician (Pulmonology)    Medical History Review  Past Medical, Family, and Social History reviewed and does not contribute to the patient presenting condition    Health Maintenance   Topic Date Due    Hepatitis C screen  Never done    Shingles vaccine (1 of 2) Never done    Respiratory Syncytial Virus (RSV) Pregnant or age 74 yrs+ (62 - 1-dose 60+ series) Never done    Diabetic foot exam  02/01/2015    Diabetic Alb to Cr ratio (uACR) test  02/24/2015    Diabetic retinal exam  10/29/2015    Pneumococcal 65+ years Vaccine (2 - PPSV23 or PCV20) 06/04/2016    Lipids  08/27/2016    GFR test (Diabetes, CKD 3-4, OR last GFR 15-59)  04/20/2017    Flu vaccine (1) 05/28/2022    COVID-19 Vaccine (2 - 2023-24 season) 06/28/2022    A1C test (Diabetic or Prediabetic)  03/19/2023    Depression Monitoring  06/08/2023    Breast cancer screen  10/09/2024    Colorectal Cancer Screen  06/26/2025    DEXA (modify frequency per FRAX score)  Completed    Hepatitis A vaccine  Aged Out    Hepatitis B  vaccine  Aged Out    Hib vaccine  Aged Out    Polio vaccine  Aged Out    Meningococcal (ACWY) vaccine  Aged Out

## 2022-12-20 NOTE — Telephone Encounter (Signed)
PC to patient to make patient aware that Dr Nita Sells is leaving the [facility] location and will be seeing patients in [place] - unable to reach patient LVM to contact office.     Will also send a letter with information for both offices.

## 2023-01-16 ENCOUNTER — Encounter: Payer: MEDICARE | Attending: Family Medicine | Primary: Family Medicine

## 2023-01-20 NOTE — Telephone Encounter (Signed)
Neha pharmacist from [facility] called, states Victoza is on [employer] back order and asking if you would like to prescribe other alternative.  Patient's last fill was 2/19 for 30 day supply so should be out.  Writer advised Neha that patient has appt 3/27 and PCP can discuss with patient then.  [employer] will call day or so after appt to see what was decided.

## 2023-01-22 ENCOUNTER — Encounter

## 2023-01-23 ENCOUNTER — Ambulatory Visit: Admit: 2023-01-23 | Discharge: 2023-01-23 | Payer: MEDICARE | Attending: Family Medicine | Primary: Family Medicine

## 2023-01-23 DIAGNOSIS — E119 Type 2 diabetes mellitus without complications: Secondary | ICD-10-CM

## 2023-01-23 MED ORDER — OZEMPIC (0.25 OR 0.5 MG/DOSE) 2 MG/3ML SC SOPN
23 MG/3ML | SUBCUTANEOUS | 2 refills | Status: AC
Start: 2023-01-23 — End: 2023-05-06

## 2023-01-23 MED ORDER — BD PEN NEEDLE SHORT U/F 31G X 8 MM MISC
3 refills | Status: AC
Start: 2023-01-23 — End: ?

## 2023-01-23 NOTE — Progress Notes (Signed)
Patient presents today for routine checkup.    Last seen at the end of December, 2023 for annual wellness visit.    Patient states she is unable to get Victoza and would like to switch to an alternative.  Chart review shows that she has had elevated A1c in the past approximately 7.2 with obesity.  Discussed option of trying Ozempic.  Patient is agreement and appears to have indication.        Negative for:     Worry / mood complaints  Headache  Dizziness  Visual Disturbance  Hearing Changes  Nasal / sinus Symptoms  Mouth / tooth symptom, pain  Throat pain  Difficulty swallowing  Neck pain  Chest discomfort  Cough  SOB  N/V/D/C  Pelvic area discomfort  Bladder / voiding discomfort  Bowel complaints  MSk complaints   Numbness/tingling/abnormal sensations   Edema / Leg swelling  Dizziness  Fatigue  Bleeding   Skin    Pertinent Pos: See HPI -as above.    Vitals:    01/23/23 1358   BP: 138/82   Pulse: 77   SpO2: 97%       Alert and oriented to PPT  NAD    HEENT - neg  Neck - no bruits, no lymphadenopathy  Chest  HRRR w/o murmer  LCTAB no wheezes / rhonchi  Extremities -0+ PTE    Gait / Station - stable, no dysequilibrium, uniform pace, no assist device, cane.     Diagnosis Orders   1. Type 2 diabetes mellitus without complication, without long-term current use of insulin (HCC)  POCT glycosylated hemoglobin (Hb A1C)          Plan:  1.)  POCT-A1c-6.9  2.)  Right at Christmas (home health service for respite options)  3.)  Change medication-noted above-Ozempic (discontinue Victoza)  4.)  Recheck in 3 to 6 months.

## 2023-03-03 ENCOUNTER — Encounter

## 2023-03-03 MED ORDER — PRAVASTATIN SODIUM 40 MG PO TABS
40 MG | ORAL_TABLET | Freq: Every evening | ORAL | 3 refills | Status: AC
Start: 2023-03-03 — End: 2023-12-09

## 2023-03-03 NOTE — Telephone Encounter (Signed)
Last visit: 10/25/2022  Last Med refill: 03/01/2022  Does patient have enough medication for 72 hours: No:     Next Visit Date:  Future Appointments   Date Time Provider Department Center   07/31/2023 11:30 AM Nolon Stalls, DO [facility]       Health Maintenance   Topic Date Due    Hepatitis C screen  Never done    Shingles vaccine (1 of 2) Never done    Respiratory Syncytial Virus (RSV) Pregnant or age 75 yrs+ (1 - 1-dose 60+ series) Never done    Diabetic foot exam  02/01/2015    Diabetic Alb to Cr ratio (uACR) test  02/24/2015    Diabetic retinal exam  10/29/2015    Pneumococcal 65+ years Vaccine (2 of 2 - PPSV23 or PCV20) 06/04/2016    Lipids  08/27/2016    GFR test (Diabetes, CKD 3-4, OR last GFR 15-59)  04/20/2017    COVID-19 Vaccine (2 - 2023-24 season) 06/28/2022    Annual Wellness Visit (Medicare Advantage)  10/28/2022    Flu vaccine (Season Ended) 05/29/2023    A1C test (Diabetic or Prediabetic)  06/25/2023    Depression Monitoring  01/13/2024    Breast cancer screen  10/09/2024    Colorectal Cancer Screen  06/26/2025    DEXA (modify frequency per FRAX score)  Completed    Hepatitis A vaccine  Aged Out    Hepatitis B vaccine  Aged Out    Hib vaccine  Aged Out    Polio vaccine  Aged Out    Meningococcal (ACWY) vaccine  Aged Out       Hemoglobin A1C (%)   Date Value   06/24/2022 6.5   03/18/2022 6.3   04/16/2021 6.5             ( goal A1C is < 7)   No components found for: "LABMICR"  No components found for: "LDLCHOLESTEROL", "LDLCALC"    (goal LDL is <100)   AST (U/L)   Date Value   02/24/2015 19     ALT (U/L)   Date Value   02/24/2015 19     BUN (mg/dL)   Date Value   54/06/8118 13     BP Readings from Last 3 Encounters:   01/23/23 138/82   10/25/22 119/67   06/07/22 106/65          (goal 120/80)    All Future Testing planned in [facility]  Lab Frequency Next Occurrence   TSH With Reflex Ft4 Once 03/18/2022   Lipid Panel Once 03/18/2022   CBC with Auto Differential Once 03/18/2022    Comprehensive Metabolic Panel Once 03/18/2022   MAM DIGITAL SCREEN W OR WO CAD BILATERAL Once 06/07/2022   MAM STEREO BREAST BX W LOC DEVICE 1ST LESION RIGHT Once 09/27/2022               Patient Active Problem List:     Hay fever     DM (diabetes mellitus) (HCC)     Depression     Urinary incontinence     Hyperlipidemia     HTN (hypertension)     Anxiety     Generalized headaches     OA (osteoarthritis)

## 2023-05-06 ENCOUNTER — Ambulatory Visit: Admit: 2023-05-06 | Discharge: 2023-05-06 | Payer: MEDICARE | Attending: Family Medicine | Primary: Family Medicine

## 2023-05-06 VITALS — BP 124/80 | HR 62 | Resp 18 | Wt 270.4 lb

## 2023-05-06 DIAGNOSIS — E119 Type 2 diabetes mellitus without complications: Principal | ICD-10-CM

## 2023-05-06 MED ORDER — LEVOTHYROXINE SODIUM 150 MCG PO TABS
150 MCG | ORAL_TABLET | ORAL | 3 refills | Status: DC
Start: 2023-05-06 — End: 2024-02-09

## 2023-05-06 MED ORDER — OZEMPIC (1 MG/DOSE) 4 MG/3ML SC SOPN
4 MG/3ML | SUBCUTANEOUS | 5 refills | Status: DC
Start: 2023-05-06 — End: 2023-11-10

## 2023-05-06 MED ORDER — LORAZEPAM 1 MG PO TABS
1 MG | ORAL_TABLET | Freq: Four times a day (QID) | ORAL | 0 refills | Status: AC | PRN
Start: 2023-05-06 — End: 2023-05-13

## 2023-05-06 NOTE — Progress Notes (Signed)
Recheck    Problems:  Upset regarding weight gain  Discussed diabetic history-not monitoring at home  Marked anxiety due to debilitating husband at home (status post stroke)  History of hyperlipidemia/hypothyroid  Depressed mood    Negative for:  Headache  Dizziness  Visual Disturbance  Hearing Changes  Nasal / sinus Symptoms  Mouth / tooth symptom, pain  Throat pain  Difficulty swallowing  Neck pain  Chest discomfort  Cough  SOB  N/V/D/C  Pelvic area discomfort  Bladder / voiding discomfort  Bowel complaints  MSk complaints   Numbness/tingling/abnormal sensations   Edema / Leg swelling  Dizziness  Fatigue  Bleeding   Skin    Pertinent Pos: See HPI -   Worry / mood complaints    Vitals:    05/06/23 0852   BP: 124/80   Pulse: 62   Resp: 18   SpO2: 98%       Alert and oriented to PPT  NAD    HEENT - neg  Neck - no bruits, no lymphadenopathy  Chest  HRRR w/o murmer  LCTAB no wheezes / rhonchi  Gait / Station - stable, no dysequilibrium, uniform pace, no assist device, cane.     Diagnosis Orders   1. Type 2 diabetes mellitus without complication, without long-term current use of insulin (HCC)  Basic Metabolic Panel    Hemoglobin A1C      2. Anxiety  LORazepam (ATIVAN) 1 MG tablet      3. Hypothyroidism, unspecified type  levothyroxine (SYNTHROID) 150 MCG tablet    TSH    T4, Free      4. 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  PDMP shows last filled approximately 1 year ago.-Discussed controlled substance roles and will fill only 15 tablets at this time, refill 0.  2.)  Discussed surveillance of metabolic issues labs given-recheck A1c and thyroid levels  3.)  Increase Ozempic dosage from 0.25 to 1.0 mg weekly  4.)  Repeat labs and will monitor-follow-up in 3 months.

## 2023-05-07 ENCOUNTER — Encounter

## 2023-05-07 NOTE — Telephone Encounter (Signed)
Last visit: 10/25/2022  Last Med refill: 05/03/2022  Does patient have enough medication for 72 hours: No:     Next Visit Date:  Future Appointments   Date Time Provider Department Center   07/31/2023 11:30 AM Nolon Stalls, DO [facility]   08/05/2023 11:00 AM Melba Coon, Levada Dy, DO [facility]       Health Maintenance   Topic Date Due    Hepatitis C screen  Never done    Shingles vaccine (1 of 2) Never done    Respiratory Syncytial Virus (RSV) Pregnant or age 75 yrs+ (1 - 1-dose 60+ series) Never done    Diabetic foot exam  02/01/2015    Diabetic retinal exam  10/29/2015    Pneumococcal 65+ years Vaccine (2 of 2 - PPSV23 or PCV20) 06/04/2016    Lipids  08/27/2016    COVID-19 Vaccine (2 - 2023-24 season) 06/28/2022    Annual Wellness Visit (Medicare Advantage)  10/28/2022    Flu vaccine (1) 05/29/2023    A1C test (Diabetic or Prediabetic)  06/25/2023    Depression Monitoring  01/13/2024    Colorectal Cancer Screen  06/26/2025    DEXA (modify frequency per FRAX score)  Completed    Hepatitis A vaccine  Aged Out    Hepatitis B vaccine  Aged Out    Hib vaccine  Aged Out    Polio vaccine  Aged Out    Meningococcal (ACWY) vaccine  Aged Out    Diabetic Alb to Cr ratio (uACR) test  Discontinued    GFR test (Diabetes, CKD 3-4, OR last GFR 15-59)  Discontinued    Breast cancer screen  Discontinued       Hemoglobin A1C (%)   Date Value   06/24/2022 6.5   03/18/2022 6.3   04/16/2021 6.5             ( goal A1C is < 7)   No components found for: "LABMICR"  No components found for: "LDLCHOLESTEROL", "LDLCALC"    (goal LDL is <100)   AST (U/L)   Date Value   02/24/2015 19     ALT (U/L)   Date Value   02/24/2015 19     BUN (mg/dL)   Date Value   21/30/8657 13     BP Readings from Last 3 Encounters:   05/06/23 124/80   01/23/23 138/82   10/25/22 119/67          (goal 120/80)    All Future Testing planned in [facility]  Lab Frequency Next Occurrence   MAM DIGITAL SCREEN W OR WO CAD BILATERAL Once 06/07/2022   MAM  STEREO BREAST BX W LOC DEVICE 1ST LESION RIGHT Once 09/27/2022   Basic Metabolic Panel Once 05/06/2023   Hemoglobin A1C Once 05/06/2023   TSH Once 05/06/2023   T4, Free Once 05/06/2023   Lipid, Fasting Once 05/06/2023               Patient Active Problem List:     Hay fever     DM (diabetes mellitus) (HCC)     Depression     Urinary incontinence     Hyperlipidemia     HTN (hypertension)     Anxiety     Generalized headaches     OA (osteoarthritis)

## 2023-05-08 MED ORDER — DICYCLOMINE HCL 10 MG PO CAPS
10 | ORAL_CAPSULE | ORAL | 2 refills | 18.50000 days | Status: DC
Start: 2023-05-08 — End: 2024-07-26

## 2023-05-12 LAB — LIPID, FASTING
Cholesterol, Fasting: 140
HDL: 29 mg/dL — AB (ref 35–70)
LDL Cholesterol: 69
Triglyceride, Fasting: 212

## 2023-05-12 LAB — HEMOGLOBIN A1C
Estimated Avg Glucose: 169
Hemoglobin A1C: 7.5 %

## 2023-05-12 LAB — BASIC METABOLIC PANEL
BUN: 14 mg/dL
CO2: 29 mmol/L
Calcium: 9.1 mg/dL
Chloride: 102 mmol/L
Creatinine: 0.83
Est, Glom Filt Rate: 73
Glucose: 176 mg/dL
Potassium: 4.7 mmol/L
Sodium: 139 mmol/L

## 2023-05-12 LAB — TSH: TSH: 2.61 u[IU]/mL

## 2023-05-12 LAB — T4, FREE: T4 Free: 0.96

## 2023-05-13 ENCOUNTER — Encounter

## 2023-07-03 ENCOUNTER — Encounter

## 2023-07-03 MED ORDER — IRBESARTAN 150 MG PO TABS
150 | ORAL_TABLET | ORAL | 3 refills | Status: DC
Start: 2023-07-03 — End: 2023-08-12

## 2023-07-03 MED ORDER — PANTOPRAZOLE SODIUM 40 MG PO TBEC
40 | ORAL_TABLET | ORAL | 3 refills | Status: DC
Start: 2023-07-03 — End: 2024-11-22

## 2023-07-03 NOTE — Telephone Encounter (Signed)
 Last Visit Date: 05/06/2023   Next Visit Date: 07/31/2023

## 2023-07-31 ENCOUNTER — Ambulatory Visit: Admit: 2023-07-31 | Discharge: 2023-07-31 | Payer: MEDICARE | Attending: Family Medicine | Primary: Family Medicine

## 2023-07-31 DIAGNOSIS — Z0000 Encounter for general adult medical examination without abnormal findings: Secondary | ICD-10-CM

## 2023-07-31 LAB — POCT GLYCOSYLATED HEMOGLOBIN (HGB A1C): Hemoglobin A1C: 7.5 %

## 2023-07-31 NOTE — Progress Notes (Signed)
 July visit last seen  Right knee - Dr Lorence - scheduled TKA in January, 2025 - tyreating with Mobic   Previously with Dr Sabra  Retinologist - retired    Dr Vertell - leaving [facility]    Stressed out lately, husband still recovering.    Negative for:     Worry / mood complaints  Headache  Dizziness  Visual Disturbance  Hearing Changes  Nasal / sinus Symptoms  Mouth / tooth symptom, pain  Throat pain  Difficulty swallowing  Neck pain  Chest discomfort  Cough  SOB  N/V/D/C  Pelvic area discomfort  Bladder / voiding discomfort  Bowel complaints  MSk complaints   Numbness/tingling/abnormal sensations   Edema / Leg swelling  Dizziness  Fatigue  Bleeding   Skin    Pertinent Pos: See HPI - AWV    Vitals:    07/31/23 1125   BP: 124/66   Pulse: 87   Resp: 18   SpO2: 97%        Diagnosis Orders   1. Encounter for annual wellness visit (AWV) in Medicare patient        2. Type 2 diabetes mellitus without complication, without long-term current use of insulin (HCC)  POCT glycosylated hemoglobin (Hb A1C)        POCT - 7.5       Plan:  1.)  reck in 3-6 m

## 2023-08-12 ENCOUNTER — Encounter

## 2023-08-12 MED ORDER — IRBESARTAN 150 MG PO TABS
150 | ORAL_TABLET | Freq: Every evening | ORAL | 3 refills | 90.00000 days | Status: DC
Start: 2023-08-12 — End: 2024-09-14

## 2023-08-12 NOTE — Telephone Encounter (Signed)
Last Visit Date: 07/31/2023   Next Visit Date: Visit date not found

## 2023-10-15 ENCOUNTER — Encounter: Admit: 2023-10-15 | Admitting: Family Medicine

## 2023-10-15 DIAGNOSIS — F419 Anxiety disorder, unspecified: Secondary | ICD-10-CM

## 2023-10-15 NOTE — Telephone Encounter (Signed)
 Last Visit Date: 07/31/2023   Next Visit Date: Visit date not found   Pharmacy: [facility] [place]    Pt requested a refill stating she his having knee replacement surgery in Feb but in the meantime has been having difficulty sleeping.    Please advise

## 2023-10-16 NOTE — Telephone Encounter (Signed)
 Left voicemail for pt to return call to schedule an appt

## 2023-11-09 ENCOUNTER — Encounter

## 2023-11-10 MED ORDER — OZEMPIC (1 MG/DOSE) 4 MG/3ML SC SOPN
4 | SUBCUTANEOUS | 5 refills | Status: DC
Start: 2023-11-10 — End: 2024-05-18

## 2023-11-10 NOTE — Telephone Encounter (Signed)
Last Visit Date: 07/31/2023   Next Visit Date: 11/11/2023

## 2023-11-11 ENCOUNTER — Ambulatory Visit: Admit: 2023-11-11 | Discharge: 2023-11-11 | Payer: MEDICARE | Attending: Family Medicine | Primary: Family Medicine

## 2023-11-11 VITALS — BP 124/62 | HR 82 | Ht 65.0 in | Wt 260.0 lb

## 2023-11-11 DIAGNOSIS — R490 Dysphonia: Secondary | ICD-10-CM

## 2023-11-11 MED ORDER — LORAZEPAM 1 MG PO TABS
1 | ORAL_TABLET | Freq: Four times a day (QID) | ORAL | 1 refills | 22.50000 days | Status: DC | PRN
Start: 2023-11-11 — End: 2024-05-27

## 2023-11-11 NOTE — Progress Notes (Signed)
Face to face refill request for ativan renewal.  Per usual, reminded her that I am willing to do refills but only on a face-to-face basis never over the phone or by message.  She does report using Ativan sparingly - still helps her quite a bit with her ongoing day-to-day stresses.    PDMP review shows only myself prescribing and the last time she had it filled was in July, 2024.    Local hoarseness - worsening, patient would like referral to Dr. Bonnita Levan.  Has seen him in the past.  Denies any symptoms of dysphagia or shortness of breath.    Negative for:     Worry / mood complaints  Headache  Dizziness  Visual Disturbance  Hearing Changes  Nasal / sinus Symptoms  Mouth / tooth symptom, pain  Throat pain  Difficulty swallowing  Neck pain  Chest discomfort  Cough  SOB  N/V/D/C  Pelvic area discomfort  Bladder / voiding discomfort  Bowel complaints  MSk complaints   Numbness/tingling/abnormal sensations   Edema / Leg swelling  Dizziness  Fatigue  Bleeding   Skin    Pertinent Pos: See HPI - as above, anxiety, situational.    Vitals:    11/11/23 1024   BP: 124/62   Pulse: 82   SpO2: 98%       Alert and oriented to PPT  NAD    HEENT - neg  Neck - no bruits, no lymphadenopathy  Chest  HRRR w/o murmer  LCTAB no wheezes / rhonchi    Gait / Station - stable, no dysequilibrium, uniform pace, no assist device, cane.     Diagnosis Orders   1. Hoarseness of voice  AFL - Bonnita Levan, MD, Otolaryngology, [place]      2. Anxiety  LORazepam (ATIVAN) 1 MG tablet          Plan:  1.)  PDMP reviewed-documented no signs of abuse or misuse.  2.)  Refill Ativan-added refill for convenience, patient scheduled for TKA with Dr. Alysia Penna at [facility] orthopedics on December 19, 2023  3.)  Recheck in 3 months.

## 2023-12-05 NOTE — H&P (Signed)
PRE-OPERATIVE HISTORY AND PHYSICAL    Exam Date: 12/05/23  Surgery Date: 12/15/2023    PCP: Nolon Stalls, DO   Surgeon: Kizzie Fantasia, MD      CC:  Primary osteoarthritis of right knee     HPI:  Brandy [name] is a 78 y.o. female who presents for pre-op H&P for planned Replacement Total Joint Right Knee. She has had issues with the right knee for the last year though has progressively worsened over the last six months. She admits to pain, limited range of motion. She denies instability. She rates her pain as a 3 to 10 on a scale of 0 to 10. Pain is aggravated with certain positions, as well as standing for long periods. She did have previous cortisone injections twice without much improvement in her symptoms. Right knee xray completed on 07/08/2023 revealed near complete loss of joint space within the medial compartment suggestive of severe osteoarthritis.  Slight varus alignment. She does have a 2 front wheel walker at home.     Allergies   Allergen Reactions   . Meperidine Hallucinations   . Tetanus Antitoxin Swelling     Other reaction(s): Other allergic reaction  arm swells      Prior to Admission medications    Medication Sig Start Date End Date Taking? Authorizing Provider   acetaminophen (TYLENOL EXTRA STRENGTH) 500 mg tablet Take 2 tablets (1,000 mg total) by mouth every 6 (six) hours as needed for pain.    Not In System Ref Prov   ADULT LOW DOSE ASPIRIN ORAL Take 81 mg by mouth daily.         Not In System Ref Prov   capsaicin (CAPZASIN-HP) 0.1 % cream Apply 1 Application topically 3 (three) times a day. 05/27/23   Tamera Punt, APRN-CNP   dicyclomine (BENTYL) 10 mg capsule Take 1 capsule (10 mg total) by mouth 4 (four) times a day. 11/02/19   Bill Salinas, MD   FLUoxetine (PROzac) 20 mg capsule Take 1 capsule (20 mg total) by mouth in the morning.  Patient not taking: Reported on 11/25/2022 07/30/21   Not In System Ref Prov   ibuprofen (MOTRIN) 800 mg tablet Take 1 tablet (800 mg total) by mouth 3  (three) times a day.  Patient not taking: Reported on 07/08/2023 05/28/23   Reather Littler, MD   irbesartan (AVAPRO) 150 mg tablet Take 1 tablet (150 mg total) by mouth nightly. 06/17/19   Bill Salinas, MD   levothyroxine (SYNTHROID, LEVOTHROID) 150 MCG tablet Take 1 tablet (150 mcg total) by mouth daily Indications: a condition with low thyroid hormone levels. 11/02/19   Bill Salinas, MD   meloxicam (MOBIC) 15 mg tablet Take 1 tablet (15 mg total) by mouth every morning.  TAKE 1 TABLET(15 MG) BY MOUTH IN THE MORNING 11/04/23   Reather Littler, MD   OZEMPIC 1 mg/dose (4 mg/3 mL) pen injector INJECT 1 MG INTO THE SKIN EVERY 7 DAYS 07/01/23   Not In System Ref Prov   pantoprazole (PROTONIX) 40 mg EC tablet Take 1 tablet (40 mg total) by mouth 2 (two) times a day. 06/17/19   Bill Salinas, MD   pravastatin (PRAVACHOL) 40 mg tablet Take 1 tablet (40 mg total) by mouth daily Indications: excessive fat in the blood. 06/17/19   Bill Salinas, MD   pregabalin (LYRICA) 150 mg capsule Take 1 capsule (150 mg total) by mouth in the morning and 1 capsule (150  mg total) before bedtime. 10/16/23   Anabel Halon, MD   traMADoL (ULTRAM) 50 mg tablet TAKE 1 TABLET(50 MG) BY MOUTH EVERY 8 HOURS AS NEEDED FOR PAIN 10/26/23   Reather Littler, MD   VICTOZA 2-PAK 0.6 mg/0.1 mL (18 mg/3 mL) pen injector ADMINISTER 1.2 MG UNDER THE SKIN DAILY  Patient not taking: Reported on 08/28/2023 05/07/21   Not In System Ref Prov     History :  Past Medical History:   Diagnosis Date   . AAA (abdominal aortic aneurysm) (CMS-HCC)     2.6 cm per scan in 2018   . Acid reflux    . Allergic    . Anxiety    . Arthritis    . Back pain    . Breast disorder     benign biopsy RT   . Cataract     bilat; removed   . Dental disease     crowns, missing tooth   . Depression    . Diabetes mellitus type 2, controlled (CMS-HCC)    . DOE (dyspnea on exertion)    . Encephalitis 2014   . Glaucoma    . High cholesterol    . Hypertension    . Hypothyroidism    . IBS (irritable bowel  syndrome)    . Neuropathy    . Neuropathy 12/04/2023   . Obesity    . Plantar fasciitis     left  foot   . Pneumonia    . Primary osteoarthritis of right knee 11/2023   . Sleep apnea     does not use a machine   . Urinary urgency    . Varicose vein of leg    . Venous insufficiency    . Visual impairment        Past Surgical History:   Procedure Laterality Date   . BREAST BIOPSY  December 2023   . BREAST SURGERY      Lumpectomy   . CARDIAC CATHETERIZATION  2010    x 2   . CARPAL TUNNEL RELEASE Right 1983   . CATARACT EXTRACTION      bilat   around 2015   . COLONOSCOPY     . EYE SURGERY  April 24, 2021   . FOOT SURGERY Right 2004   . HERNIA REPAIR Left 1990    inguinal   . HYSTERECTOMY  1991   . JOINT REPLACEMENT Left 2013    knee   . KNEE ARTHROSCOPY Bilateral 2007,  2010   . LEFT SHOULDER MANIPULATION Left 03/17/2018    Performed by Ike Bene, DO at [facility] SUR[facility]   . SPINE SURGERY  2019       Family History   Problem Relation Age of Onset   . Stroke Mother    . Atrial fibrillation Mother    . COPD Father    . Diabetes Father    . Heart disease Father    . Prostate cancer Father    . Alcohol abuse Sister         accidental   . Other Problem (accidental) Sister    . Alcohol abuse Daughter    . COPD Son    . Colon cancer Maternal Uncle    . Diabetes Paternal Grandmother    . Bleeding Disorder Neg Hx    . Clotting disorder Neg Hx    . Anesthesia problems Neg Hx    . Breast cancer Neg Hx    . Ovarian cancer Neg  Hx        Social History     Socioeconomic History   . Marital status: Married     Spouse name: Not on file   . Number of children: Not on file   . Years of education: Not on file   . Highest education level: Not on file   Occupational History   . Not on file   Tobacco Use   . Smoking status: Never   . Smokeless tobacco: Never   Vaping Use   . Vaping status: Never Used   Substance and Sexual Activity   . Alcohol use: Yes     Alcohol/week: 4.0 standard drinks of alcohol     Types: 4 Drinks containing 0.5 oz  of alcohol per week     Comment: socially   . Drug use: No   . Sexual activity: Not Currently     Partners: Male   Other Topics Concern   . Not on file   Social History Narrative   . Not on file     Social Drivers of Health     Financial Resource Strain: Not on file   Food Insecurity: No Food Insecurity (12/05/2023)    Hunger Screening    . Food Insecurity - Worry: Never True    . Food Insecurity - Inability: Never True   Transportation Needs: Not on file   Physical Activity: Not on file   Stress: Not on file   Social Connections: Not on file   Interpersonal Safety: Not on file   Housing Instability: Not on file       Review of Systems  Review of Systems   Constitutional:  Negative for fever, chills, diaphoresis, appetite change, fatigue and unexpected weight change.   HENT:  Negative for congestion, ear discharge, ear pain, hearing loss, nosebleeds, tinnitus and trouble swallowing.    Eyes:  Negative for pain, discharge and visual disturbance.   Respiratory:  Positive for apnea (does not use CPAP) and shortness of breath. Negative for cough and wheezing.    Cardiovascular:  Negative for chest pain, palpitations and leg swelling.   Gastrointestinal:  Negative for nausea, vomiting, abdominal pain, diarrhea, constipation, blood in stool, anal bleeding and black tarry stool.   Endocrine: Negative for polydipsia, polyphagia and polyuria.   Genitourinary:  Negative for bladder incontinence, dysuria, urgency, frequency and hematuria.   Musculoskeletal:  Positive for myalgias and arthralgias. Negative for joint swelling.   Skin:  Negative for rash and wound.   Neurological:  Positive for headaches. Negative for dizziness, seizures, syncope, speech difficulty, weakness, light-headedness, numbness and dysesthesia.   Hematological:  Does not bruise/bleed easily.   Psychiatric/Behavioral:  Negative for dysphoric mood, sleep disturbance and suicidal ideas. The patient is not nervous/anxious.      Vital Signs  BP 134/62   Pulse  66   Temp 36.1 C (96.9 F) (Temporal)   Resp 18   Ht 167.6 cm (5\' 6" )   Wt 116.4 kg (256 lb 9.9 oz)   LMP 03/13/1990   SpO2 97%   BMI 41.42 kg/m     Labs/Diagnostics:   No results found for this or any previous visit (from the past 24 hours).    Physical Exam  Physical Exam   Constitutional She is oriented to person, place, and time. She appears well-developed and well-nourished.   HENT   Head Normocephalic and atraumatic.   Ears   Right Ear: External ear normal.   Left Ear: External ear normal.  Nose Nose normal.     Mouth/Throat   Throat:   Oropharynx: oropharynx clear and moist  Eyes: Conjunctivae are normal.   Neck  Normal range of motion. Neck supple. No tracheal deviation present.   Negative for thyromegaly.  Cardiovascular:  Normal rate and regular rhythm.   No murmur heard.  Pulses:   intact distal pulses  Heart Sounds: normal heart sounds.   no JVDno friction rub  Pulmonary/Chest: Effort normal and breath sounds normal. She has no wheezes. She has no rales. No respiratory distress.   Abdominal: Bowel sounds are normal. She exhibits no distension. Soft. There is no abdominal tenderness. Musculoskeletal:         General: No edema. Normal range of motion.      Cervical back: Normal range of motion and neck supple.      Right knee: Tenderness present over the medial joint line.     Neurological  She is alert and oriented to person, place, and time.   Skin: Skin is warm and dry.   Psychiatric:   She has a normal mood and affect. Her behavior is normal. Judgment and thought content normal.     Assessment  Primary osteoarthritis of right knee     Plan  Replacement Total Joint Right Knee   Labs- BMP, hgbA1c, Vit D completed at visit, see EPIC for results           Jonna Clark, APRN-CNP  12/05/23 1555

## 2023-12-06 ENCOUNTER — Encounter

## 2023-12-08 NOTE — Telephone Encounter (Signed)
 Last visit: 11/11/23  Last Med refill: 03/03/23  Does patient have enough medication for 72 hours: no    Next Visit Date:12/09/23  Future Appointments   Date Time Provider Department Center   12/09/2023 10:00 AM Nolon Stalls, DO [facility] E[facility] DEP   02/10/2024 11:30 AM Nolon Stalls, DO [facility] DEP       Health Maintenance   Topic Date Due    Hepatitis C screen  Never done    Shingles vaccine (1 of 2) Never done    Diabetic foot exam  02/01/2015    Diabetic Alb to Cr ratio (uACR) test  02/24/2015    Diabetic retinal exam  10/29/2015    Pneumococcal 50+ years Vaccine (2 of 2 - PPSV23) 06/04/2016    Respiratory Syncytial Virus (RSV) Pregnant or age 52 yrs+ (1 - 1-dose 75+ series) Never done    Flu vaccine (1) 05/29/2023    COVID-19 Vaccine (2 - 2024-25 season) 06/29/2023    Annual Wellness Visit (Medicare Advantage)  10/29/2023    Lipids  05/11/2024    GFR test (Diabetes, CKD 3-4, OR last GFR 15-59)  05/11/2024    A1C test (Diabetic or Prediabetic)  07/30/2024    Depression Monitoring  11/08/2024    Colorectal Cancer Screen  06/26/2025    DEXA (modify frequency per FRAX score)  Completed    Hepatitis A vaccine  Aged Out    Hepatitis B vaccine  Aged Out    Hib vaccine  Aged Out    Polio vaccine  Aged Out    Meningococcal (ACWY) vaccine  Aged Out    Breast cancer screen  Discontinued       Hemoglobin A1C (%)   Date Value   07/31/2023 7.5   05/12/2023 7.5   06/24/2022 6.5             ( goal A1C is < 7)   No components found for: "LABMICR"  No components found for: "LDLCHOLESTEROL", "LDLCALC"    (goal LDL is <100)   AST (U/L)   Date Value   02/24/2015 19     ALT (U/L)   Date Value   02/24/2015 19     BUN (mg/dL)   Date Value   16/07/9603 14     BP Readings from Last 3 Encounters:   11/11/23 124/62   07/31/23 124/66   05/06/23 124/80          (goal 120/80)    All Future Testing planned in [facility]  Lab Frequency Next Occurrence               Patient Active Problem List:     Hay fever     DM (diabetes  mellitus) (HCC)     Depression     Urinary incontinence     Hyperlipidemia     HTN (hypertension)     Anxiety     Generalized headaches     OA (osteoarthritis)

## 2023-12-09 ENCOUNTER — Ambulatory Visit: Admit: 2023-12-09 | Discharge: 2023-12-09 | Payer: MEDICARE | Attending: Family Medicine | Primary: Family Medicine

## 2023-12-09 VITALS — BP 126/86 | HR 86 | Wt 256.0 lb

## 2023-12-09 DIAGNOSIS — Z01818 Encounter for other preprocedural examination: Secondary | ICD-10-CM

## 2023-12-09 MED ORDER — PRAVASTATIN SODIUM 40 MG PO TABS
40 MG | ORAL_TABLET | Freq: Every evening | ORAL | 3 refills | Status: DC
Start: 2023-12-09 — End: 2024-04-08

## 2023-12-09 NOTE — Progress Notes (Signed)
Pre op clearance as requested per her orthopedic surgeon.    Planned surgery-right knee replacement  No use of anticoagulants, stopped low-dose aspirin as per prior recommendations.      Prior note reflects similar status for preoperative clearance.    Denies cardiopulmonary symptoms no change in interval status since last seen 1 month ago.  Negative for:     Worry / mood complaints  Headache  Dizziness  Visual Disturbance  Hearing Changes  Nasal / sinus Symptoms  Mouth / tooth symptom, pain  Throat pain  Difficulty swallowing  Neck pain  Chest discomfort  Cough  SOB  N/V/D/C  Pelvic area discomfort  Bladder / voiding discomfort  Bowel complaints  MSk complaints   Numbness/tingling/abnormal sensations   Edema / Leg swelling  Dizziness  Fatigue  Bleeding   Skin    Pertinent Pos: See HPI -negative    Vitals:    12/09/23 0955   BP: 126/86   Pulse: 86   SpO2: 98%       Alert and oriented to PPT  NAD    HEENT - neg  Neck - no bruits, no lymphadenopathy  Chest  HRRR w/o murmer  LCTAB no wheezes / rhonchi  Abdomen - soft, non-tender  Extremities -0+ PTE    Gait / Station - stable, no dysequilibrium, uniform pace, no assist device, cane.     Diagnosis Orders   1. Preoperative clearance          REVISED CARDIAC RISK INDEX (RCRI)      HIGH RISK SURGERY (Intraperitoneal, intrathoracic, suprainguinal vascular) - no    HISTORY OF ISCHEMIC HEART DISEASE - no    HISTORY OF CHF  - no    HISTORY OF CVD (CEREBROVASCULAR DISEASE) - no     PRE-OPERATIVE INSULIN TREATMENT - no    PRE-OPERATIVE CREATININE >2 mg/dL - no      Thyroid about the    1 - Class II  0.9%  2 - Class III 6.6 %  3 - 6 Class IV   11%      Plan:  1.)  May proceed to surgery  2.)  Note to Dr. Alysia Penna

## 2024-01-18 ENCOUNTER — Ambulatory Visit
Admission: EM | Admit: 2024-01-18 | Discharge: 2024-01-18 | Disposition: A | Discharge status: Home / Self Care | Attending: Internal Medicine | Admitting: Internal Medicine

## 2024-01-18 ENCOUNTER — Other Ambulatory Visit: Payer: Self-pay

## 2024-01-18 ENCOUNTER — Ambulatory Visit (INDEPENDENT_AMBULATORY_CARE_PROVIDER_SITE_OTHER)

## 2024-01-18 ENCOUNTER — Encounter: Payer: Self-pay | Admitting: *Deleted

## 2024-01-18 DIAGNOSIS — J069 Acute upper respiratory infection, unspecified: Secondary | ICD-10-CM | POA: Diagnosis not present

## 2024-01-18 DIAGNOSIS — R051 Acute cough: Secondary | ICD-10-CM | POA: Diagnosis not present

## 2024-01-18 MED ORDER — PREDNISONE 20 MG PO TABS: 40.0000 mg | ORAL_TABLET | Freq: Every day | ORAL | 0 refills | Status: AC

## 2024-01-18 MED ORDER — DOXYCYCLINE HYCLATE 100 MG PO CAPS: 100.0000 mg | ORAL_CAPSULE | Freq: Two times a day (BID) | ORAL | 0 refills | Status: AC

## 2024-01-18 MED ORDER — ALBUTEROL SULFATE HFA 108 (90 BASE) MCG/ACT IN AERS: 1.0000 | INHALATION_SPRAY | Freq: Four times a day (QID) | RESPIRATORY_TRACT | 0 refills | Status: AC | PRN

## 2024-01-18 MED ORDER — BENZONATATE 100 MG PO CAPS: 100.0000 mg | ORAL_CAPSULE | Freq: Three times a day (TID) | ORAL | 0 refills | Status: AC | PRN

## 2024-01-18 NOTE — Discharge Instructions (Signed)
 I will call if x-ray is abnormal.  I have prescribed you four medications for symptoms.  Take with food to avoid stomach upset.  Follow-up if any symptoms persist or worsen.

## 2024-01-18 NOTE — ED Provider Notes (Addendum)
 [facility]    CSN: [id number] Arrival date & time: 01/18/24  1054      History   Chief Complaint Chief Complaint  Patient presents with   Cough    HPI Kara Buchanan is a 78 y.o. female.   Patient presents with cough and nasal congestion that started about 7 days ago.  Reports that she also has shortness of breath with exertion.  Reports known sick contacts in the household.  Denies any associated fever.  Has taken Robitussin over the counter.  Denies history of asthma or COPD.  Patient denies cigarette smoking.   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      Home Medications    Prior to Admission medications   Medication Sig Start Date End Date Taking? Authorizing Provider  metoprolol tartrate (LOPRESSOR) 25 MG tablet Take 25 mg by mouth 2 (two) times daily.     Yes [provider]  simvastatin (ZOCOR) 40 MG tablet Take 40 mg by mouth every evening.     Yes [provider]  albuterol (VENTOLIN HFA) 108 (90 Base) MCG/ACT inhaler Inhale 1-2 puffs into the lungs every 6 (six) hours as needed for wheezing or shortness of breath. 01/18/24  Yes Ervin Knack E, FNP  aspirin EC 325 MG tablet Take 1 tablet (325 mg  total) by mouth daily. Patient not taking: Reported on 01/18/2024 10/04/12   Marshia Ly, PA-C  benzonatate (TESSALON) 100 MG capsule Take 1 capsule (100 mg total) by mouth every 8 (eight) hours as needed for cough. 01/18/24  Yes Tilda Samudio, Rolly Salter E, FNP  doxycycline (VIBRAMYCIN) 100 MG capsule Take 1 capsule (100 mg total) by mouth 2 (two) times daily for 7 days. 01/18/24 01/25/24 Yes Makinley Muscato, Acie Fredrickson, FNP  FLUoxetine (PROZAC) 20 MG capsule Take 20 mg by mouth daily. Patient not taking: Reported on 01/18/2024    [provider]  predniSONE (DELTASONE) 20 MG tablet Take 2 tablets (40 mg total) by mouth daily for 5 days. 01/18/24 01/23/24 Yes Gustavus Bryant, FNP    Family History Family History  Problem Relation Age of Onset   Cancer Mother        cervical   Cancer Father        lung   Heart disease Father    Cervical cancer Unknown    Hematuria Unknown    Lung cancer Unknown    Nephrolithiasis Unknown     Social History Social History   Tobacco Use   Smoking status: Never   Smokeless tobacco: Never  Vaping Use   Vaping status: Never Used  Substance Use Topics   Alcohol use: No  Drug use: No     Allergies   Difluprednate   Review of Systems Review of Systems Per HPI  Physical Exam Triage Vital Signs ED Triage Vitals  Encounter Vitals Group     BP 01/18/24 1158 (!) 160/82     Systolic BP Percentile --      Diastolic BP Percentile --      Pulse Rate 01/18/24 1158 87     Resp 01/18/24 1158 (!) 24     Temp 01/18/24 1158 98.5 F (36.9 C)     Temp Source 01/18/24 1158 Oral     SpO2 01/18/24 1158 96 %     Weight --      Height --      Head Circumference --      Peak Flow --      Pain Score 01/18/24 1157 0     Pain Loc --      Pain Education --      Exclude from Growth Chart --    No data found.  Updated Vital Signs BP (!) 160/82 (BP Location: Right Arm)   Pulse 87   Temp 98.5 F (36.9 C) (Oral)   Resp (!) 24   SpO2 96%   Visual Acuity Right Eye  Distance:   Left Eye Distance:   Bilateral Distance:    Right Eye Near:   Left Eye Near:    Bilateral Near:     Physical Exam Constitutional:      General: She is not in acute distress.    Appearance: Normal appearance. She is not toxic-appearing or diaphoretic.  HENT:     Head: Normocephalic and atraumatic.     Right Ear: Tympanic membrane and ear canal normal.     Left Ear: Tympanic membrane and ear canal normal.     Nose: Congestion present.     Mouth/Throat:     Mouth: Mucous membranes are moist.     Pharynx: No posterior oropharyngeal erythema.  Eyes:     Extraocular Movements: Extraocular movements intact.     Conjunctiva/sclera: Conjunctivae normal.     Pupils: Pupils are equal, round, and reactive to light.  Cardiovascular:     Rate and Rhythm: Normal rate and regular rhythm.     Pulses: Normal pulses.     Heart sounds: Normal heart sounds.  Pulmonary:     Effort: Pulmonary effort is normal. No respiratory distress.     Breath sounds: Normal breath sounds. No stridor. No wheezing, rhonchi or rales.  Musculoskeletal:        General: Normal range of motion.     Cervical back: Normal range of motion.  Skin:    General: Skin is warm and dry.  Neurological:     General: No focal deficit present.     Mental Status: She is alert and oriented to person, place, and time. Mental status is at baseline.  Psychiatric:        Mood and Affect: Mood normal.        Behavior: Behavior normal.      UC Treatments / Results  Labs (all labs ordered are listed, but only abnormal results are displayed) Labs Reviewed - No data to display  EKG   Radiology DG Chest 2 View Result Date: 01/18/2024 CLINICAL DATA:  Cough.  Congestion. EXAM: CHEST - 2 VIEW COMPARISON:  Two-view chest x-ray 11/10/2015 FINDINGS: The heart is mildly enlarged. Mild pulmonary vascular congestion is present without frank edema. No effusions are present. No focal airspace  disease is present. Exaggerated  thoracic kyphosis is again noted. IMPRESSION: Mild cardiomegaly and pulmonary vascular congestion without frank edema. Electronically Signed   By: Marin Roberts M.D.   On: 01/18/2024 13:08    Procedures Procedures (including critical care time)  Medications Ordered in UC Medications - No data to display  Initial Impression / Assessment and Plan / UC Course  I have reviewed the triage vital signs and the nursing notes.  Pertinent labs & imaging results that were available during my care of the patient were reviewed by me and considered in my medical decision making (see chart for details).     Suspect symptoms started off as viral but given duration of symptoms, will treat with doxycycline antibiotic to cover for secondary bacterial infection.  Chest x-ray negative for any bronchitis versus pneumonia.  It does show mild cardiomegaly and pulmonary vascular congestion which is not noted on previous images.  Discussed these findings with patient and the importance of following up with PCP for this.  She was also given strict ER precautions regarding current symptoms and x-ray findings.  She voiced understanding of this.  Oxygen is normal and patient is not tachypneic so patient is safe for discharge.  Also prescribed albuterol inhaler to take as needed for shortness of breath as well as benzonatate for cough.  Patient also prescribed short course of prednisone to see if this will help decrease inflammation in chest and help with shortness of breath.  She reports she has taken prednisone previously and tolerated well.  I do think that a short course of steroids would be safe as benefits also outweigh risks to help with shortness of breath. Viral testing deferred given duration of symptoms as it would not change treatment.  Advised strict follow-up precautions.  Patient verbalized understanding and was agreeable with plan. Final Clinical Impressions(s) / UC Diagnoses   Final diagnoses:  Acute  cough  Acute upper respiratory infection     Discharge Instructions      I will call if x-ray is abnormal.  I have prescribed you four medications for symptoms.  Take with food to avoid stomach upset.  Follow-up if any symptoms persist or worsen.    ED Prescriptions     Medication Sig Dispense Auth. Provider   doxycycline (VIBRAMYCIN) 100 MG capsule Take 1 capsule (100 mg total) by mouth 2 (two) times daily for 7 days. 14 capsule [place], [place] E, [place]   benzonatate (TESSALON) 100 MG capsule Take 1 capsule (100 mg total) by mouth every 8 (eight) hours as needed for cough. 21 capsule [place], [place] E, [place]   albuterol (VENTOLIN HFA) 108 (90 Base) MCG/ACT inhaler Inhale 1-2 puffs into the lungs every 6 (six) hours as needed for wheezing or shortness of breath. 1 each [place], Rolly Salter E, [place]   predniSONE (DELTASONE) 20 MG tablet Take 2 tablets (40 mg total) by mouth daily for 5 days. 10 tablet Gustavus Bryant, [place]      PDMP not reviewed this encounter.   Gustavus Bryant, [place] 01/18/24 1330    Gustavus Bryant, [place] 01/18/24 1332

## 2024-01-18 NOTE — ED Triage Notes (Signed)
 Pt reports congested cough x 1 week. States "I lose my breath sometimes and I get real winded  if I try to do anything". States she has been taken robitussin with no relief. Family members have had similar symptoms and her daughter is being treated for pneumonia

## 2024-02-09 ENCOUNTER — Encounter

## 2024-02-09 MED ORDER — LEVOTHYROXINE SODIUM 150 MCG PO TABS
150 | ORAL_TABLET | Freq: Every day | ORAL | 3 refills | Status: DC
Start: 2024-02-09 — End: 2024-05-27

## 2024-02-10 ENCOUNTER — Ambulatory Visit
Admit: 2024-02-10 | Discharge: 2024-02-10 | Payer: Medicare (Managed Care) | Attending: Family Medicine | Primary: Family Medicine

## 2024-02-10 VITALS — BP 120/70 | Ht 68.9 in | Wt 243.6 lb

## 2024-02-10 DIAGNOSIS — Z0000 Encounter for general adult medical examination without abnormal findings: Secondary | ICD-10-CM

## 2024-02-10 MED ORDER — FUROSEMIDE 20 MG PO TABS
20 MG | ORAL_TABLET | Freq: Every day | ORAL | 1 refills | Status: DC
Start: 2024-02-10 — End: 2024-04-12

## 2024-02-10 NOTE — Patient Instructions (Signed)
 Learning About Being Active as an Older Adult  Why is being active important as you get older?     Being active is one of the best things you can do for your health. And it's never too late to start. Being active--or getting active, if you aren't already--has definite benefits. It can:  Give you more energy,  Keep your mind sharp.  Improve balance to reduce your risk of falls.  Help you manage chronic illness with fewer medicines.  No matter how old you are, how fit you are, or what health problems you have, there is a form of activity that will work for you. And the more physical activity you can do, the better your overall health will be.  What kinds of activity can help you stay healthy?  Being more active will make your daily activities easier. Physical activity includes planned exercise and things you do in daily life. There are four types of activity:  Aerobic.  Doing aerobic activity makes your heart and lungs strong.  Includes walking, dancing, and gardening.  Aim for at least 2 hours spread throughout the week.  It improves your energy and can help you sleep better.  Muscle-strengthening.  This type of activity can help maintain muscle and strengthen bones.  Includes climbing stairs, using resistance bands, and lifting or carrying heavy loads.  Aim for at least twice a week.  It can help protect the knees and other joints.  Stretching.  Stretching gives you better range of motion in joints and muscles.  Includes upper arm stretches, calf stretches, and gentle yoga.  Aim for at least twice a week, preferably after your muscles are warmed up from other activities.  It can help you function better in daily life.  Balancing.  This helps you stay coordinated and have good posture.  Includes heel-to-toe walking, tai chi, and certain types of yoga.  Aim for at least 3 days a week.  It can reduce your risk of falling.  Even if you have a hard time meeting the recommendations, it's better to be more active  than less active. All activity done in each category counts toward your weekly total. You'd be surprised how daily things like carrying groceries, keeping up with grandchildren, and taking the stairs can add up.  What keeps you from being active?  If you've had a hard time being more active, you're not alone. Maybe you remember being able to do more. Or maybe you've never thought of yourself as being active. It's frustrating when you can't do the things you want. Being more active can help. What's holding you back?  Getting started.  Have a goal, but break it into easy tasks. Small steps build into big accomplishments.  Staying motivated.  If you feel like skipping your activity, remember your goal. Maybe you want to move better and stay independent. Every activity gets you one step closer.  Not feeling your best.  Start with 5 minutes of an activity you enjoy. Prove to yourself you can do it. As you get comfortable, increase your time.  You may not be where you want to be. But you're in the process of getting there. Everyone starts somewhere.  How can you find safe ways to stay active?  Talk with your doctor about any physical challenges you're facing. Make a plan with your doctor if you have a health problem or aren't sure how to get started with activity.  If you're already active, ask your doctor if  there is anything you should change to stay safe as your body and health change.  If you tend to feel dizzy after you take medicine, avoid activity at that time. Try being active before you take your medicine. This will reduce your risk of falls.  If you plan to be active at home, make sure to clear your space before you get started. Remove things like TV cords, coffee tables, and throw rugs. It's safest to have plenty of space to move freely.  The key to getting more active is to take it slow and steady. Try to improve only a little bit at a time. Pick just one area to improve on at first. And if an activity hurts,  stop and talk to your doctor.  Where can you learn more?  Go to [employer] and enter P600 to learn more about "Learning About Being Active as an Older Adult."  Current as of: May 28, 2023  Content Version: 14.4   2024-2025 [place], [place].   Care instructions adapted under license by [facility]. If you have questions about a medical condition or this instruction, always ask your healthcare professional. Laren Player, [facility], disclaims any warranty or liability for your use of this information.         Starting a Weight-Loss Plan: Care Instructions  Overview    It can be a challenge to lose weight. But your doctor can help you make a weight-loss plan that meets your needs.  You don't have to make a lot of big changes at once. A better idea might be to focus on small changes and stick with them. When those changes become habit, you can add a few more changes.  Some people find it helpful to take an exercise or nutrition class. If you have questions, ask your doctor about seeing a registered dietitian or an exercise specialist. You might also think about joining a weight-loss support group.  If you're not ready to make changes right now, try to pick a date in the future. Then make an appointment with your doctor to talk about when and how you'll get started with a plan.  Follow-up care is a key part of your treatment and safety. Be sure to make and go to all appointments, and call your doctor if you are having problems. It's also a good idea to know your test results and keep a list of the medicines you take.  How can you care for yourself as you start a weight-loss plan?  Set realistic goals. Many people expect to lose much more weight than is likely. A weight loss of 5% to 10% of your body weight may be enough to improve your health.  Get family and friends involved to provide support. Talk to them about why you are trying to lose weight, and ask them to help. They can help by  participating in exercise and having meals with you, even if they may be eating something different.  Find what works best for you. If you do not have time or do not like to cook, a program that offers meal replacement bars or shakes may be better for you. Or if you like to prepare meals, finding a plan that includes daily menus and recipes may be best.  Ask your doctor about other health professionals who can help you achieve your weight-loss goals.  A dietitian can help you make healthy changes in your diet.  An exercise specialist or personal trainer can help you develop  a safe and effective exercise program.  A counselor or psychiatrist can help you cope with issues such as depression, anxiety, or family problems that can make it hard to focus on weight loss.  Consider joining a support group for people who are trying to lose weight. Your doctor can suggest groups in your area.  Where can you learn more?  Go to [employer] and enter U357 to learn more about "Starting a Weight-Loss Plan: Care Instructions."  Current as of: February 25, 2023  Content Version: 14.4   2024-2025 [place], [place].   Care instructions adapted under license by [facility]. If you have questions about a medical condition or this instruction, always ask your healthcare professional. Larene Beach, [facility], disclaims any warranty or liability for your use of this information.         A Healthy Heart: Care Instructions  Overview     Coronary artery disease, also called heart disease, occurs when a substance called plaque builds up in the vessels that supply oxygen-rich blood to your heart muscle. This can narrow the blood vessels and reduce blood flow. A heart attack happens when blood flow is completely blocked. A high-fat diet, smoking, and other factors increase the risk of heart disease.  Your doctor has found that you have a chance of having heart disease. A heart-healthy lifestyle can help keep your heart  healthy and prevent heart disease. This lifestyle includes eating healthy, being active, staying at a weight that's healthy for you, and not smoking or using tobacco. It also includes taking medicines as directed, managing other health conditions, and trying to get a healthy amount of sleep.  Follow-up care is a key part of your treatment and safety. Be sure to make and go to all appointments, and call your doctor if you are having problems. It's also a good idea to know your test results and keep a list of the medicines you take.  How can you care for yourself at home?  Diet    Use less salt when you cook and eat. This helps lower your blood pressure. Taste food before salting. Add only a little salt when you think you need it. With time, your taste buds will adjust to less salt.     Eat fewer snack items, fast foods, canned soups, and other high-salt, high-fat, processed foods.     Read food labels and try to avoid saturated and trans fats. They increase your risk of heart disease by raising cholesterol levels.     Limit the amount of solid fat--butter, margarine, and shortening--you eat. Use olive, peanut, or canola oil when you cook. Bake, broil, and steam foods instead of frying them.     Eat a variety of fruit and vegetables every day. Dark green, deep orange, red, or yellow fruits and vegetables are especially good for you. Examples include spinach, carrots, peaches, and berries.     Foods high in fiber can reduce your cholesterol and provide important vitamins and minerals. High-fiber foods include whole-grain cereals and breads, oatmeal, beans, brown rice, citrus fruits, and apples.     Eat lean proteins. Heart-healthy proteins include seafood, lean meats and poultry, eggs, beans, peas, nuts, seeds, and soy products.     Limit drinks and foods with added sugar. These include candy, desserts, and soda pop.   Heart-healthy lifestyle    If your doctor recommends it, get more exercise. For many people, walking  is a good choice. Or you may want to swim,  bike, or do other activities. Bit by bit, increase the time you're active every day. Try for at least 30 minutes on most days of the week.     Try to quit or cut back on using tobacco and other nicotine products. This includes smoking and vaping. If you need help quitting, talk to your doctor about stop-smoking programs and medicines. These can increase your chances of quitting for good. Quitting is one of the most important things you can do to protect your heart. It is never too late to quit. Try to avoid secondhand smoke too.     Stay at a weight that's healthy for you. Talk to your doctor if you need help losing weight.     Try to get 7 to 9 hours of sleep each night.     Limit alcohol to 2 drinks a day for men and 1 drink a day for women. Too much alcohol can cause health problems.     Manage other health problems such as diabetes, high blood pressure, and high cholesterol. If you think you may have a problem with alcohol or drug use, talk to your doctor.   Medicines    Take your medicines exactly as prescribed. Call your doctor if you think you are having a problem with your medicine.     If your doctor recommends aspirin, take the amount directed each day. Make sure you take aspirin and not another kind of pain reliever, such as acetaminophen (Tylenol).   When should you call for help?   Call 911 if you have symptoms of a heart attack. These may include:    Chest pain or pressure, or a strange feeling in the chest.     Sweating.     Shortness of breath.     Pain, pressure, or a strange feeling in the back, neck, jaw, or upper belly or in one or both shoulders or arms.     Lightheadedness or sudden weakness.     A fast or irregular heartbeat.   After you call 911, the operator may tell you to chew 1 adult-strength or 2 to 4 low-dose aspirin. Wait for an ambulance. Do not try to drive yourself.  Watch closely for changes in your health, and be sure to contact your  doctor if you have any problems.  Where can you learn more?  Go to [employer] and enter F075 to learn more about "A Healthy Heart: Care Instructions."  Current as of: May 28, 2023  Content Version: 14.4   2024-2025 [place], [place].   Care instructions adapted under license by [facility]. If you have questions about a medical condition or this instruction, always ask your healthcare professional. Larene Beach, [facility], disclaims any warranty or liability for your use of this information.    Personalized Preventive Plan for LACHE DAGHER - 02/10/2024  Medicare offers a range of preventive health benefits. Some of the tests and screenings are paid in full while other may be subject to a deductible, co-insurance, and/or copay.  Some of these benefits include a comprehensive review of your medical history including lifestyle, illnesses that may run in your family, and various assessments and screenings as appropriate.  After reviewing your medical record and screening and assessments performed today your provider may have ordered immunizations, labs, imaging, and/or referrals for you.  A list of these orders (if applicable) as well as your Preventive Care list are included within your After Visit Summary for your review.

## 2024-02-10 NOTE — Progress Notes (Signed)
 AWV  Discussed post right TKA  Chart reviewed - no additional services needed  Left eye - seeing dr Reita May, retinologist, for eye concerns.    Discussed nocturia, patient holding fluids and discussed trial of diuretic daily as needed.    Negative for:     Worry / mood complaints  Headache  Dizziness  Visual Disturbance  Hearing Changes  Nasal / sinus Symptoms  Mouth / tooth symptom, pain  Throat pain  Difficulty swallowing  Neck pain  Chest discomfort  Cough  SOB  N/V/D/C  Pelvic area discomfort  Bladder / voiding discomfort  Bowel complaints  MSk complaints   Numbness/tingling/abnormal sensations   Edema / Leg swelling  Dizziness  Fatigue  Bleeding   Skin    Pertinent Pos: See HPI - above    Vitals:    02/10/24 1122   BP: 120/70       Alert and oriented to PPT  NAD    HEENT - neg  Neck - no bruits, no lymphadenopathy  Chest  HRRR w/o murmer  LCTAB no wheezes / rhonchi  Abdomen - soft, non-tender, BS  Extremities - 1-2+ PTE    Gait / Station - stable, no dysequilibrium, uniform pace, no assist device, cane.     Diagnosis Orders   1. Medicare annual wellness visit, subsequent        2. Edema, unspecified type  furosemide (LASIX) 20 MG tablet          Plan:  1.)  periodic usage of Lasix daily to assist in fluid management by 5 pm to reduce a degree of nocturia.  2.)  reck in 6 m  Medicare Annual Wellness Visit    DARRIAN GOODWILL is here for Medicare AWV    Assessment & Plan   Medicare annual wellness visit, subsequent  Edema, unspecified type  -     furosemide (LASIX) 20 MG tablet; Take 1 tablet by mouth daily, Disp-60 tablet, R-1Normal       Return in 3 months (on 05/11/2024) for fluid retention.     Subjective   See above    Patient's complete Health Risk Assessment and screening values have been reviewed and are found in Flowsheets. The following problems were reviewed today and where indicated follow up appointments were made and/or referrals ordered.    Positive Risk Factor Screenings with Interventions:           Controlled Medication Review:    Today's Pain Level: No data recorded   Opioid Risk: (Low risk score <55) Opioid risk score: 7    Patient is low risk for opioid use disorder or overdose.    Last PDMP Loraine Leriche as Reviewed:  Review User Review Instant Review Result   Wandalee Ferdinand 11/11/2023 10:31 AM     Reviewed PDMP [1]     Last Controlled Substance Monitoring Documentation      Flowsheet Row Office Visit from 11/11/2023 in [facility]   Periodic Controlled Substance Monitoring No signs of potential drug abuse or diversion identified. filed at 11/11/2023 1031               Inactivity:  On average, how many days per week do you engage in moderate to strenuous exercise (like a brisk walk)?: 0 days (!) Abnormal  On average, how many minutes do you engage in exercise at this level?: 0 min  Interventions:  See AVS for additional education material     Abnormal BMI (obese):  Body  mass index is 36.08 kg/m. (!) Abnormal  Interventions:  above           Safety:  Do you have working smoke detectors?: (!) No  Interventions:  Patient declined any further interventions or treatment                   Objective   Vitals:    02/10/24 1122   BP: 120/70   Weight: 110.5 kg (243 lb 9.6 oz)   Height: 1.75 m (5' 8.9")      Body mass index is 36.08 kg/m.                    Allergies   Allergen Reactions    Ciprofloxacin Itching     IV cipro only    Meperidine      Other reaction(s): Hallucinations    Meperidine Hcl      Other reaction(s): Unknown    Sulfacetamide     Tetanus Antitoxin      Other reaction(s): Other allergic reaction    Tetanus Toxoids      Prior to Visit Medications    Medication Sig Taking? Authorizing Provider   furosemide (LASIX) 20 MG tablet Take 1 tablet by mouth daily Yes Nolon Stalls, DO   levothyroxine (SYNTHROID) 150 MCG tablet TAKE 1 TABLET BY MOUTH DAILY Yes Nolon Stalls, DO   pravastatin (PRAVACHOL) 40 MG tablet TAKE 1 TABLET BY MOUTH EVERY EVENING Yes Nolon Stalls, DO   pregabalin (LYRICA) 150 MG capsule Take 1 capsule by mouth 2 times daily. Yes [provider]   traMADol (ULTRAM) 50 MG tablet Take 1 tablet by mouth every 8 hours as needed. Yes [provider]   acetaminophen (TYLENOL) 500 MG tablet Take 2 tablets by mouth every 6 hours as needed Yes [provider]   Semaglutide, 1 MG/DOSE, (OZEMPIC, 1 MG/DOSE,) 4 MG/3ML SOPN sc injection INJECT 1 MG INTO THE SKIN EVERY 7 DAYS Yes Nolon Stalls, DO   irbesartan (AVAPRO) 150 MG tablet Take 1 tablet by mouth nightly Yes Nolon Stalls, DO   Capsaicin 0.1 % CREA Apply 1 Application topically 3 times daily Yes [provider]   ibuprofen (ADVIL;MOTRIN) 800 MG tablet Take 1 tablet by mouth 3 times daily Yes [provider]   pantoprazole (PROTONIX) 40 MG tablet TAKE 1 TABLET BY MOUTH TWICE DAILY Yes Nolon Stalls, DO   dicyclomine (BENTYL) 10 MG capsule TAKE 1 CAPSULE BY MOUTH FOUR TIMES DAILY Yes Nolon Stalls, DO   Insulin Pen Needle (B-D ULTRAFINE III SHORT PEN) 31G X 8 MM MISC USE ONCE DAILY AS DIRECTED Yes Nolon Stalls, DO   aspirin 81 MG tablet Take 1 tablet by mouth daily Yes [provider]       CareTeam (Including outside providers/suppliers regularly involved in providing care):   Patient Care Team:  Nolon Stalls, DO as PCP - General (Family Medicine)  Nolon Stalls, DO as PCP - Empaneled Provider  Rothhaas, Ezra Sites, DO as Surgeon (Orthopedic Surgery)  Gena Fray, MD as Consulting Physician (Pulmonology)     Recommendations for Preventive Services Due: see orders and patient instructions/AVS.  Recommended screening schedule for the next 5-10 years is provided to the patient in written form: see Patient Instructions/AVS.     Reviewed and updated this visit:  Tobacco  Allergies  Meds  Sexual Hx

## 2024-04-08 ENCOUNTER — Encounter

## 2024-04-10 ENCOUNTER — Encounter

## 2024-04-10 NOTE — Telephone Encounter (Signed)
 Last Visit Date: 02/10/2024   Next Visit Date: 08/19/2024

## 2024-04-12 MED ORDER — FUROSEMIDE 20 MG PO TABS
20 | ORAL_TABLET | Freq: Every day | ORAL | 1 refills | 30.00000 days | Status: AC
Start: 2024-04-12 — End: ?

## 2024-04-12 MED ORDER — PRAVASTATIN SODIUM 40 MG PO TABS
40 | ORAL_TABLET | Freq: Every evening | ORAL | 3 refills | 90.00000 days | Status: AC
Start: 2024-04-12 — End: ?

## 2024-05-18 ENCOUNTER — Encounter

## 2024-05-18 NOTE — Telephone Encounter (Signed)
 Last Visit Date: 02/10/2024   Next Visit Date: 05/27/2024

## 2024-05-19 MED ORDER — OZEMPIC (1 MG/DOSE) 4 MG/3ML SC SOPN
4 | SUBCUTANEOUS | 5 refills | 28.00000 days | Status: DC
Start: 2024-05-19 — End: 2024-11-16

## 2024-05-27 ENCOUNTER — Ambulatory Visit
Admit: 2024-05-27 | Discharge: 2024-05-27 | Payer: Medicare (Managed Care) | Attending: Family Medicine | Primary: Family Medicine

## 2024-05-27 VITALS — BP 128/84 | HR 78 | Wt 250.0 lb

## 2024-05-27 DIAGNOSIS — E139 Other specified diabetes mellitus without complications: Principal | ICD-10-CM

## 2024-05-27 LAB — POCT GLYCOSYLATED HEMOGLOBIN (HGB A1C): Hemoglobin A1C: 6 %

## 2024-05-27 MED ORDER — LORAZEPAM 1 MG PO TABS
1 | ORAL_TABLET | Freq: Four times a day (QID) | ORAL | 1 refills | 30.00000 days | Status: DC | PRN
Start: 2024-05-27 — End: 2024-08-19

## 2024-05-27 MED ORDER — SERTRALINE HCL 50 MG PO TABS
50 | ORAL_TABLET | Freq: Every day | ORAL | 1 refills | 45.00000 days | Status: AC
Start: 2024-05-27 — End: ?

## 2024-05-27 MED ORDER — LEVOTHYROXINE SODIUM 150 MCG PO TABS
150 | ORAL_TABLET | Freq: Every day | ORAL | 3 refills | 90.00000 days | Status: AC
Start: 2024-05-27 — End: ?

## 2024-05-27 NOTE — Progress Notes (Signed)
 History of Present Illness    Refill-thyroid, (told by [facility] pharmacy she had no refills left on her thyroid).  Patient states she ran out of thyroid and was upset.  Chart review shows that she was prescribed 90 days with 3 refills on February 09, 2024.  As I pointed out to her, the refills had been available and I printed the prescription report so she can review this with her pharmacist at [facility].    Patient upset-myself running late to her encounter (25 minutes).  Patient starts crying.  States she is depressed and has trouble with telling people no.  States that she is being told by many friends that she is not the same person and she is hard to get along with.  She requested possibility of starting depression medication.  Also wants something for anxiety.  (Did PHQ 2 and GAD-7 on this encounter-see results)    Discussed possible hormonal imbalance.  Patient open to considering estrogen level assessment and thyroid status.      Negative for:       Headache  Dizziness  Visual Disturbance  Hearing Changes  Nasal / sinus Symptoms  Mouth / tooth symptom, pain  Throat pain  Difficulty swallowing  Neck pain  Chest discomfort  Cough  SOB  N/V/D/C  Pelvic area discomfort  Bladder / voiding discomfort  Bowel complaints  MSk complaints   Numbness/tingling/abnormal sensations   Edema / Leg swelling  Dizziness  Fatigue  Bleeding   Skin    Pertinent Pos: See HPI - Worry / mood complaints, mood disorder, explosive personality,    Vitals:    05/27/24 1215   BP: 128/84   Pulse: 78   SpO2: 98%       Alert, oriented to person, place and time, NAD  Physical Exam  Patient tearful  Long discussion-often counseling patient declined  Refill for lorazepam  offered-patient accepted.  Heart regular rate and rhythm  Lungs clear    Gait / Station - stable, no dysequilibrium, uniform pace, no assist device, cane.    Generalized Anxiety Disorder 7-item (GAD-7)   ShareThe Generalized Anxiety Disorder 7-item (GAD-7) is a easy to perform  initial screening tool for generalized anxiety disorder1.  Over the last 2 weeks, how often have you been bothered by the following problems?    Not at all    Several days    More than half the days    Nearly every day    1.Feeling nervous, anxious or on edge      0     +1     +2     +3  x  2.Not being able to stop or control worrying      0     +1     +2     +3 x  3.Worrying too much about different things      0     +1     +2     +3 x  4.Trouble relaxing      0     +1 x    +2     +3   5.Being so restless that it is hard to sit still      0 x    +1     +2     +3   6.Becoming easily annoyed or irritable      0     +1     +2     +3 x  7.Feeling  afraid as if something awful might happen      0     +1     +2     +3 x      Interpretation:      When screening for anxiety disorders, a score of 8 or greater represents a reasonable cut-point for identifying probable cases of generalized anxiety disorder; further diagnostic assessment is warranted to determine the presence and type of anxiety disorder. Using a cut-off of 8 the GAD-7 has a sensitivity of 92% and specificity of 76% for diagnosis generalized anxiety disorder. 2,3  The following cut-offs correlate with level of anxiety severity:  Score 0-4: Minimal Anxiety  Score 5-9: Mild Anxiety  Score 10-14: Moderate Anxiety  Score greater than 15: Severe Anxiety    Score - 16    Based on a recent meta-analysis, some experts have recommended considering using a cut-off of 8 in order to optimize sensitivity without compromising specificity2.      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 Depressed mood  sertraline  (ZOLOFT ) 50 MG tablet      3. Hypothyroidism, unspecified type  TSH    T4, Free    levothyroxine  (SYNTHROID ) 150 MCG tablet      4. Menopause  Estradiol      5. Anxiety  LORazepam  (ATIVAN ) 1 MG tablet       6. Palpitations  AFL (Epic) - Alo, Sinan, DO, Cardiology, [place]          Incidental complaint of palpitations at end of visit-patient open to a cardiology opinion and interpretation.    Recheck as needed per patient request-opened schedule to allow for routine interval scheduling, patient declined.    Patient was offered and has provided verbal consent for the use of DAX voice recognition chart documentation for this encounter.

## 2024-06-17 ENCOUNTER — Telehealth

## 2024-06-17 MED ORDER — SERTRALINE HCL 100 MG PO TABS
100 | ORAL_TABLET | Freq: Every day | ORAL | 5 refills | 90.00000 days | Status: DC
Start: 2024-06-17 — End: 2024-08-19

## 2024-06-17 NOTE — Telephone Encounter (Signed)
 Medication increased to 100 mg. Daily.    Next step consider second med such as Buspar .

## 2024-06-17 NOTE — Telephone Encounter (Signed)
 Patient called stating Zoloft  is not working, is requesting med to be increased.  She states she is losing her temper very easily and cannot deal with it.  She does not want to see anyone yet, she has a good friend that she talks to for support.    Please advise.

## 2024-06-23 NOTE — Telephone Encounter (Signed)
 Patient notified and verbalized understanding.

## 2024-07-20 ENCOUNTER — Ambulatory Visit: Admit: 2024-07-20 | Discharge: 2024-07-20 | Payer: Medicare (Managed Care) | Primary: Family Medicine

## 2024-07-20 ENCOUNTER — Telehealth

## 2024-07-20 ENCOUNTER — Ambulatory Visit
Admit: 2024-07-20 | Discharge: 2024-07-20 | Payer: Medicare (Managed Care) | Attending: Internal Medicine | Primary: Family Medicine

## 2024-07-20 VITALS — BP 132/78 | HR 63 | Resp 16 | Ht 66.0 in | Wt 248.3 lb

## 2024-07-20 DIAGNOSIS — R002 Palpitations: Principal | ICD-10-CM

## 2024-07-20 NOTE — Progress Notes (Signed)
 [facility]  Consultation/Follow Up.    Brandy [name]  01/18/1946  [id number]    Today: 07/20/24    CC: Patient is here for new consult for palpitations.     HPI:   Brandy [name] is here for new consult for palpitations.   Comes and goes.   Usually at rest.   Associated with chest tightness.   Feels her heart pounding.   Has SOB.   Has DOE even with 1 flight of stairs.   No LE edema.   No syncope.   Had cardiac catheterization in 2016, unfortunately not able to access, but per the patient no intervention was required.    Past Medical:  Past Medical History:   Diagnosis Date    Anxiety     Chronic back pain     Depression     DM (diabetes mellitus) (HCC)     Hay fever     Headache(784.0)     Hyperlipidemia     Hypertension     Irritable bowel syndrome     Neuropathy     Obesity     Osteoarthritis     Type 2 diabetes mellitus without complication (HCC)     Unspecified sleep apnea     Urinary incontinence          Past Surgical:  Past Surgical History:   Procedure Laterality Date    BREAST LUMPECTOMY Right     CARDIAC CATHETERIZATION  2005,2010    CARPAL TUNNEL RELEASE Right 10/28/1981    CYST REMOVAL Right     x2    EYE SURGERY  Macular hole repair    HERNIA REPAIR Left 10/28/1988    femoral    HYSTERECTOMY (CERVIX STATUS UNKNOWN)  10/28/1989    HYSTERECTOMY, VAGINAL  1991    JOINT REPLACEMENT      KNEE ARTHROPLASTY Left     KNEE CARTILAGE SURGERY Right 10/29/1971    NEUROMA SURGERY Left 10/28/1988    hernia site    TOTAL KNEE ARTHROPLASTY Left 10/29/2011         Family History:  Family History   Problem Relation Age of Onset    Osteoporosis Mother     Arthritis Mother     Stroke Mother     Cancer Father         Prostate       Social History:  Social History     Socioeconomic History    Marital status: Married     Spouse name: Not on file    Number of children: Not on file    Years of education: Not on file    Highest education level: Not on file   Occupational History    Not on file   Tobacco  Use    Smoking status: Never    Smokeless tobacco: Never   Substance and Sexual Activity    Alcohol use: Yes     Alcohol/week: 2.0 - 3.0 standard drinks of alcohol     Types: 2 - 3 Drinks containing 0.5 oz of alcohol per week     Comment: weekends    Drug use: No    Sexual activity: Not Currently     Partners: Male   Other Topics Concern    Not on file   Social History Narrative    Not on file     Social Drivers of Health     Financial Resource Strain: Low Risk  (05/06/2023)    Overall Financial Resource  Strain (CARDIA)     Difficulty of Paying Living Expenses: Not hard at all   Food Insecurity: No Food Insecurity (07/05/2024)    Received from [facility]    Hunger Screening     Within the past 12 months we worried whether our food would run out before we got money to buy more.: Never True     Within the past 12 months the food we bought just didn't last and we didn't have money to get more.: Never True   Transportation Needs: No Transportation Needs (02/10/2024)    PRAPARE - Therapist, art (Medical): No     Lack of Transportation (Non-Medical): No   Physical Activity: Inactive (02/10/2024)    Exercise Vital Sign     Days of Exercise per Week: 0 days     Minutes of Exercise per Session: 0 min   Stress: Not on file   Social Connections: Not on file   Intimate Partner Violence: Not on file   Housing Stability: Low Risk  (02/10/2024)    Housing Stability Vital Sign     Unable to Pay for Housing in the Last Year: No     Number of Times Moved in the Last Year: 0     Homeless in the Last Year: No        REVIEW OF SYSTEMS:    Constitutional: there has been no unanticipated weight loss. There's been No change in energy level, No change in activity level.     Eyes: No visual changes or diplopia. No scleral icterus.  ENT: No Headaches, hearing loss or vertigo. No mouth sores or sore throat.  Cardiovascular: AS HPI  Respiratory: AS HPI  Gastrointestinal: No abdominal pain, appetite loss, blood  in stools. No change in bowel or bladder habits.  Genitourinary: No dysuria, trouble voiding, or hematuria.  Musculoskeletal:  No gait disturbance, No weakness or joint complaints.  Integumentary: No rash or pruritis.  Neurological: No headache, diplopia, change in muscle strength, numbness or tingling. No change in gait, balance, coordination, mood, affect, memory, mentation, behavior.  Psychiatric: No new anxiety or depression.  Endocrine: No temperature intolerance. No excessive thirst, fluid intake, or urination. No tremor.  Hematologic/Lymphatic: No abnormal bruising or bleeding, blood clots or swollen lymph nodes.  Allergic/Immunologic: No nasal congestion or hives.    Medications:    Current Outpatient Medications:     sertraline  (ZOLOFT ) 100 MG tablet, Take 1 tablet by mouth daily, Disp: 30 tablet, Rfl: 5    gabapentin  (NEURONTIN ) 800 MG tablet, Take 1 tablet by mouth 2 times daily., Disp: , Rfl:     levothyroxine  (SYNTHROID ) 150 MCG tablet, Take 1 tablet by mouth daily, Disp: 90 tablet, Rfl: 3    OZEMPIC , 1 MG/DOSE, 4 MG/3ML SOPN sc injection, INJECT 1 MG INTO THE SKIN EVERY 7 DAYS, Disp: 3 mL, Rfl: 5    pravastatin  (PRAVACHOL ) 40 MG tablet, Take 1 tablet by mouth every evening, Disp: 90 tablet, Rfl: 3    acetaminophen (TYLENOL) 500 MG tablet, Take 2 tablets by mouth every 6 hours as needed, Disp: , Rfl:     irbesartan  (AVAPRO ) 150 MG tablet, Take 1 tablet by mouth nightly, Disp: 90 tablet, Rfl: 3    Capsaicin 0.1 % CREA, Apply 1 Application topically 3 times daily, Disp: , Rfl:     pantoprazole  (PROTONIX ) 40 MG tablet, TAKE 1 TABLET BY MOUTH TWICE DAILY, Disp: 180 tablet, Rfl: 3    dicyclomine  (BENTYL )  10 MG capsule, TAKE 1 CAPSULE BY MOUTH FOUR TIMES DAILY, Disp: 120 capsule, Rfl: 2    Insulin Pen Needle (B-D ULTRAFINE III SHORT PEN) 31G X 8 MM MISC, USE ONCE DAILY AS DIRECTED, Disp: 100 each, Rfl: 3    aspirin 81 MG tablet, Take 1 tablet by mouth daily, Disp: , Rfl:     furosemide  (LASIX ) 20 MG tablet,  TAKE 1 TABLET BY MOUTH DAILY (Patient not taking: Reported on 07/20/2024), Disp: 90 tablet, Rfl: 1    pregabalin (LYRICA) 150 MG capsule, Take 1 capsule by mouth 2 times daily. (Patient not taking: Reported on 07/20/2024), Disp: , Rfl:      Physical Exam:   Vitals: BP 132/78   Pulse 63   Resp 16   Ht 1.676 m (5' 6)   Wt 112.6 kg (248 lb 4.8 oz)   SpO2 97%   BMI 40.08 kg/m   General appearance: alert and cooperative with exam  HEENT: Head: Normocephalic, no lesions, without obvious abnormality.  Neck: no carotid bruit, no JVD  Lungs: clear to auscultation bilaterally  Heart:  regular rate and rhythm, S1, S2 normal, no Murmur  Abdomen: soft, non-tender; bowel sounds normal; no masses,  no organomegaly  Extremities: no site injection hematoma, extremities normal, atraumatic, no cyanosis. no edema  Neurologic: Mental status: Alert, oriented, thought content appropriate    Labs:  Lab Results   Component Value Date    CHOL 215 08/28/2015    TRIG 256 08/28/2015    HDL 29 (A) 05/12/2023    LDL 69 05/12/2023    VLDL 51 08/28/2015    CHOLHDLRATIO 6.1 08/28/2015        Lab Results   Component Value Date    NA 139 05/12/2023    K 4.7 05/12/2023    CL 102 05/12/2023    CO2 29 05/12/2023    BUN 14 05/12/2023    CREATININE 0.83 05/12/2023    GLUCOSE 176 05/12/2023    CALCIUM 9.1 05/12/2023    BILITOT 0.5 02/24/2015    ALKPHOS 100 02/24/2015    AST 19 02/24/2015    ALT 19 02/24/2015    LABGLOM 73 05/12/2023    GFRAA >60 04/20/2016    GLOB NOT REPORTED 07/15/2012       EKG:   Normal sinus, sinus arrhythmia, left axis, low voltage, poor R-wave progression, can not rule out old anterior infarction    Vascular on 2/25:  Conclusions: Abdominal aortic aneurysm; the infrarenal aneurysm is small but has a possible saccular appearance with maximum diameter of 3.4 cm.  Consider cross-sectional imaging for better characterization.No evidence of iliac artery aneurysm     Past Medical and Surgical History, Problem List, Allergies,  Medications, Labs, Imaging, all reviewed extensively in EMR and with the patient.    Assessment:  Chest pain, concerning for angina given risk factors   Shortness of breath and dyspnea worse with exertion also concerning for angina   Palpitations, concerning for tachyarrhythmia   Hypertension -controlled   Dyslipidemia - on statin   Diabetes mellitus  Obesity   History of cardiac catheterization in 2016-unfortunately I do not have access to this, per the patient no intervention was required    Plan:  Check 2D echo to evaluate for significant structural heart disease   Check Lexiscan stress test further risk stratify, can not walk on a treadmill due to shortness of breath and dyspnea worse with exertion, even with 1 flight of stairs   Check 7 day  MCT monitor  Continue irbesartan    Continue Pravachol    On Ozempic  per primary    The patient was consulted on signs and symptoms of CVD/CHF/ACS and notified when to call office. The patient is to continue heart healthy diet, weight loss and exercise as tolerated. Patient's medications and side effects were discussed. Medication refills were provided if needed. Follow up appointment timing was discussed. All questions and concerns were addressed to patient's satisfaction.     Follow up in 6 months or sooner if necessary.     Thank you for allowing me to participate in the care of this patient, please do not hesitate to call if you have any questions.    Blondell Gourd, DO, FACC, RPVI, FASE, [facility]  [facility]  [email or web address]  [phone]    This note was created with the assistance of a speech-recognition program.  Although the intention is to generate a document that actually reflects the content of the visit, no guarantees can be provided that every mistake has been identified and corrected by editing.

## 2024-07-21 NOTE — Telephone Encounter (Signed)
 Patient assigned a DR400. Expected back 07/27/24

## 2024-07-26 ENCOUNTER — Encounter

## 2024-07-26 MED ORDER — DICYCLOMINE HCL 10 MG PO CAPS
10 | ORAL_CAPSULE | ORAL | 2 refills | 18.50000 days | Status: AC
Start: 2024-07-26 — End: ?

## 2024-07-26 NOTE — Telephone Encounter (Signed)
 Last Visit Date: 05/27/2024   Next Visit Date: 08/19/2024

## 2024-07-28 NOTE — Telephone Encounter (Signed)
 Pt returned monitor and cable in fair condition. No charges

## 2024-07-29 LAB — EXTENDED CARDIAC HOLTER MONITOR: Body Surface Area: 2.29 m2

## 2024-07-29 NOTE — Telephone Encounter (Signed)
 Monitor has been returned and is downloaded

## 2024-07-30 NOTE — Telephone Encounter (Signed)
"  Dr. Luwana- Please review final holter results and advise. Pt has an echo and stress test scheduled on 08/12/24. Thank you.  "

## 2024-07-30 NOTE — Telephone Encounter (Signed)
"  Holter   "

## 2024-07-30 NOTE — Telephone Encounter (Signed)
"  Await final holter results.  Upcoming echo and stress on 08/12/24.  "

## 2024-08-01 NOTE — Telephone Encounter (Signed)
"  Sinus, SB, ST, rare PAC/PVC, routine follow up  "

## 2024-08-02 NOTE — Telephone Encounter (Signed)
"  Left a message on pt's voicemail per HIPAA with holter results and Dr. Annie orders. Advised pt to contact the office PRN and reminded pt of echo/stress appt scheduled on 08/12/24.  "

## 2024-08-04 NOTE — Telephone Encounter (Signed)
"  PB LOCATION - MISSED PRECERT!!!!  10/16 stress & echo   [employer]   "

## 2024-08-04 NOTE — Telephone Encounter (Signed)
"  Echo and stress valid until 12/6  "

## 2024-08-12 ENCOUNTER — Encounter: Payer: Medicare (Managed Care) | Attending: Family Medicine | Primary: Family Medicine

## 2024-08-12 ENCOUNTER — Ambulatory Visit: Admit: 2024-08-12 | Discharge: 2024-08-12 | Payer: Medicare (Managed Care) | Primary: Family Medicine

## 2024-08-12 VITALS — Ht 66.0 in | Wt 248.0 lb

## 2024-08-12 DIAGNOSIS — R002 Palpitations: Principal | ICD-10-CM

## 2024-08-12 LAB — NM STRESS TEST WITH MYOCARDIAL PERFUSION
Baseline Diastolic BP: 72 mmHg
Baseline HR: 65 {beats}/min
Baseline Systolic BP: 149 mmHg
Body Surface Area: 2.29 m2
Nuc Stress EF: 67 %
Recovery Stage 1 Duration: 1 min:sec
Recovery Stage 1 HR: 80 {beats}/min
Recovery Stage 2 Duration: 3 min:sec
Recovery Stage 2 HR: 73 {beats}/min
Recovery Stage 3 Duration: 5 min:sec
Recovery Stage 3 HR: 74 {beats}/min
Stress ST Depression: 0 mm
Stress Stage 1 Duration: 1 min:sec
Stress Stage 1 HR: 82 {beats}/min
Stress Target HR: 144 {beats}/min
TID: 0.95

## 2024-08-12 LAB — ECHO (TTE) COMPLETE (PRN CONTRAST/BUBBLE/STRAIN/3D)
AV Area by Peak Velocity: 2.1 cm2
AV Area by VTI: 2.2 cm2
AV Mean Gradient: 6 mmHg
AV Mean Velocity: 1.2 m/s
AV Peak Gradient: 9 mmHg
AV Peak Velocity: 1.5 m/s
AV VTI: 38.4 cm
AV Velocity Ratio: 0.53
AVA/BSA Peak Velocity: 1 cm2/m2
AVA/BSA VTI: 1 cm2/m2
Ao Root Index: 1.42 cm/m2
Aortic Root: 3.1 cm
Ascending Aorta Index: 1.74 cm/m2
Ascending Aorta: 3.8 cm
Body Surface Area: 2.29 m2
E/E' Lateral: 16.46
E/E' Ratio (Averaged): 16.29
E/E' Septal: 16.12
EF BP: 58 % (ref 55–100)
EF Physician: 58 %
Est. RA Pressure: 8 mmHg
Fractional Shortening 2D: 28 % (ref 28–44)
Global Longitudinal Strain: -16.9 %
IVC Proxmal: 2.3 cm
IVSd: 1.1 cm — AB (ref 0.6–0.9)
LA Area 2C: 20.5 cm2
LA Area 4C: 18.8 cm2
LA Diameter: 4.4 cm
LA Major Axis: 5.1 cm
LA Minor Axis: 6.1 cm
LA Size Index: 2.01 cm/m2
LA Volume BP: 61 mL — AB (ref 22–52)
LA Volume Index BP: 28 mL/m2 (ref 16–34)
LA Volume Index MOD A2C: 26 mL/m2 (ref 16–34)
LA Volume Index MOD A4C: 26 mL/m2 (ref 16–34)
LA Volume MOD A2C: 57 mL — AB (ref 22–52)
LA Volume MOD A4C: 57 mL — AB (ref 22–52)
LA/AO Root Ratio: 1.42
LV E' Lateral Velocity: 4.74 cm/s
LV E' Septal Velocity: 4.84 cm/s
LV EDV A2C: 95 mL
LV EDV A4C: 95 mL
LV EDV Index A2C: 43 mL/m2
LV EDV Index A4C: 43 mL/m2
LV ESV A2C: 39 mL
LV ESV A4C: 40 mL
LV ESV Index A2C: 18 mL/m2
LV ESV Index A4C: 18 mL/m2
LV Ejection Fraction A2C: 60 %
LV Ejection Fraction A4C: 58 %
LV Mass 2D Index: 94.6 g/m2 (ref 43–95)
LV Mass 2D: 207.1 g — AB (ref 67–162)
LV RWT Ratio: 0.44
LVIDd Index: 2.28 cm/m2
LVIDd: 5 cm (ref 3.9–5.3)
LVIDs Index: 1.64 cm/m2
LVIDs: 3.6 cm
LVOT Area: 3.8 cm2
LVOT Diameter: 2.2 cm
LVOT Mean Gradient: 1 mmHg
LVOT Peak Gradient: 3 mmHg
LVOT Peak Velocity: 0.8 m/s
LVOT SV: 83.6 mL
LVOT Stroke Volume Index: 38.2 mL/m2
LVOT VTI: 22 cm
LVOT:AV VTI Index: 0.57
LVPWd: 1.1 cm — AB (ref 0.6–0.9)
MV A Velocity: 1.03 m/s
MV Area by VTI: 3 cm2
MV E Velocity: 0.78 m/s
MV E Wave Deceleration Time: 252 ms
MV E/A: 0.76
MV Max Velocity: 1.1 m/s
MV Mean Gradient: 2 mmHg
MV Mean Velocity: 0.6 m/s
MV Peak Gradient: 5 mmHg
MV VTI: 28.1 cm
MV:LVOT VTI Index: 1.28
PV Max Velocity: 1.1 m/s
PV Peak Gradient: 5 mmHg
RA Area 4C: 16.7 cm2
RA Volume Index A4C: 21 mL/m2
RA Volume: 46 mL
RV Free Wall Peak S': 11.8 cm/s
RVIDd: 2.8 cm
RVSP: 33 mmHg
TAPSE: 2 cm (ref 1.7–?)
TR Max Velocity: 2.48 m/s
TR Peak Gradient: 25 mmHg

## 2024-08-12 MED ORDER — REGADENOSON 0.4 MG/5ML IV SOLN
0.4 | Freq: Once | INTRAVENOUS | Status: AC | PRN
Start: 2024-08-12 — End: 2024-08-12
  Administered 2024-08-12: 15:00:00 0.4 mg via INTRAVENOUS

## 2024-08-12 MED ORDER — TECHNETIUM TC 99M SESTAMIBI IV KIT
Freq: Once | INTRAVENOUS | Status: AC | PRN
Start: 2024-08-12 — End: 2024-08-12
  Administered 2024-08-12: 13:00:00 10.3 via INTRAVENOUS

## 2024-08-12 MED ORDER — TECHNETIUM TC 99M SESTAMIBI IV KIT
Freq: Once | INTRAVENOUS | Status: AC | PRN
Start: 2024-08-12 — End: 2024-08-12
  Administered 2024-08-12: 15:00:00 33.3 via INTRAVENOUS

## 2024-08-12 MED ORDER — SODIUM CHLORIDE 0.9 % IV BOLUS
0.9 | Freq: Once | INTRAVENOUS | Status: AC
Start: 2024-08-12 — End: 2024-08-12
  Administered 2024-08-12: 13:00:00 30 mL via INTRAVENOUS

## 2024-08-12 MED ORDER — AMINOPHYLLINE 25 MG/ML IV SOLN
25 | Freq: Once | INTRAVENOUS | Status: AC
Start: 2024-08-12 — End: 2024-08-12
  Administered 2024-08-12: 15:00:00 50 mg via INTRAVENOUS

## 2024-08-12 NOTE — Telephone Encounter (Signed)
"  Await final stress test and echo results.  "

## 2024-08-12 NOTE — Telephone Encounter (Signed)
"  Notified pt of test results and Dr. Annie orders. Pt v/u and will contact the office PRN.  "

## 2024-08-12 NOTE — Telephone Encounter (Signed)
 Dr. Alta Jersey- please review final echo and stress test results and advise. Thank you

## 2024-08-12 NOTE — Telephone Encounter (Signed)
"  Stress negative  Echo- nml LVEF, mild AI, Mild MR  "

## 2024-08-12 NOTE — Telephone Encounter (Signed)
"  Nuclear stress completed on 08/12/24 at [facility]. Echo also will be completed  "

## 2024-08-19 ENCOUNTER — Ambulatory Visit
Admit: 2024-08-19 | Discharge: 2024-08-19 | Payer: Medicare (Managed Care) | Attending: Family Medicine | Primary: Family Medicine

## 2024-08-19 VITALS — BP 128/82 | HR 74 | Ht 66.0 in | Wt 252.2 lb

## 2024-08-19 DIAGNOSIS — R4589 Other symptoms and signs involving emotional state: Principal | ICD-10-CM

## 2024-08-19 MED ORDER — SERTRALINE HCL 100 MG PO TABS
100 | ORAL_TABLET | Freq: Every day | ORAL | 3 refills | 90.00000 days | Status: AC
Start: 2024-08-19 — End: ?

## 2024-08-19 MED ORDER — LORAZEPAM 1 MG PO TABS
1 | ORAL_TABLET | Freq: Four times a day (QID) | ORAL | 1 refills | 30.00000 days | Status: AC | PRN
Start: 2024-08-19 — End: 2024-08-27

## 2024-08-19 NOTE — Progress Notes (Signed)
"  History of Present Illness    Ck up, last seen May 27, 2024  Dr Volanda - ophthalmology, follows with him for retinal care  Stress - reduced, mothers property now transferred to her son  Dr Luwana - cardiology details reviewed - negative echo and stress.  Doing much better with life issues, periodic lorazepam  working well, not misusing meds.  No alcohol use.      Negative for:     Worry / mood complaints  Headache  Dizziness  Visual Disturbance  Hearing Changes  Nasal / sinus Symptoms  Mouth / tooth symptom, pain  Throat pain  Difficulty swallowing  Neck pain  Chest discomfort  Cough  SOB  N/V/D/C  Pelvic area discomfort  Bladder / voiding discomfort  Bowel complaints  MSk complaints   Numbness/tingling/abnormal sensations   Edema / Leg swelling  Dizziness  Fatigue  Bleeding   Skin    Pertinent Pos: See HPI -as above    Vitals:    08/19/24 1121   BP: 128/82   Pulse: 74   SpO2: 99%       Alert, oriented to person, place and time, NAD  Physical Exam      Gait / Station - stable, no dysequilibrium, uniform pace, no assist device, cane.    Mood much better today, talkative, relaxed, smiling, appearing to have joy in her world.  Still continues to have stress with her husband's chronic illness.  Heart regular rate and rhythm without murmur  Lungs clear to auscultation bilateral    Assessment & Plan       Diagnosis Orders   1. Depressed mood  sertraline  (ZOLOFT ) 100 MG tablet      2. Anxiety  sertraline  (ZOLOFT ) 100 MG tablet    LORazepam  (ATIVAN ) 1 MG tablet        Refill-lorazepam , short fill with 1 extra refill.  Renew sertraline   Discontinue Mobic  as per Ortho  Recheck in 3 to 4 months    OARRS reviewed today - chart documented (no signs of abuse, misuse or multiple prescribers).     Patient was offered and has provided verbal consent for the use of DAX voice recognition chart documentation for this encounter.   "

## 2024-09-14 ENCOUNTER — Encounter

## 2024-09-14 MED ORDER — IRBESARTAN 150 MG PO TABS
150 | ORAL_TABLET | Freq: Every evening | ORAL | 3 refills | 90.00000 days | Status: AC
Start: 2024-09-14 — End: 2024-10-11

## 2024-09-14 NOTE — Telephone Encounter (Signed)
"  Last Visit Date: 08/19/2024   Next Visit Date: 11/23/2024     "

## 2024-10-04 ENCOUNTER — Encounter

## 2024-10-06 ENCOUNTER — Encounter

## 2024-10-06 NOTE — Progress Notes (Signed)
 Brandy [name] had concerns including Macular hole of left eye.  HPI    EP/ME   Patient is here for a six month follow up. Patient states no vision changes since last visit OU. She has longstanding floaters OU. Denies flashes. She does not use any eye drops.  Last edited by Bianca Keriko on 10/06/2024  2:26 PM.        ROS    Negative for: Constitutional, Gastrointestinal, Neurological, Skin, Genitourinary, Musculoskeletal, HENT, Endocrine, Cardiovascular, Eyes, Respiratory, Psychiatric, Allergic/Imm, Heme/Lymph  Last edited by Renda Donning on 10/06/2024  2:26 PM.        No current outpatient medications on file as of 10/06/2024. (Ophthalmic Drugs)     No current facility-administered medications on file as of 10/06/2024. (Ophthalmic Drugs)     Outpatient Medications as of 10/06/2024 (Other)   Medication Sig    acetaminophen (TYLENOL EXTRA STRENGTH) 500 mg tablet Take 1 tablet (500 mg total) by mouth every 6 (six) hours as needed for pain.    aspirin 81 mg Take 1 tablet (81 mg total) by mouth in the morning.    cyanocobalamin (vitamin B-12) 1000 MCG tablet Take 1 tablet (1,000 mcg total) by mouth in the morning.    diclofenac sodium (VOLTAREN ARTHRITIS PAIN) 1 % gel Apply 2 g topically in the morning and 2 g at noon and 2 g in the evening and 2 g before bedtime.    dicyclomine  (BENTYL ) 10 mg capsule Take 1 capsule (10 mg total) by mouth as needed.    gabapentin  (NEURONTIN ) 400 MG tablet Take 1 tablet (400 mg total) by mouth in the morning and 1 tablet (400 mg total) before bedtime.    irbesartan  (AVAPRO ) 150 mg tablet Take 1 tablet (150 mg total) by mouth nightly.    levothyroxine  (SYNTHROID , LEVOTHROID) 150 MCG tablet Take 1 tablet (150 mcg total) by mouth daily Indications: a condition with low thyroid hormone levels.    LORazepam  (ATIVAN ) 1 mg tablet Take 1 tablet (1 mg total) by mouth as needed for anxiety.    meloxicam  (MOBIC ) 15 mg tablet TAKE 1 TABLET BY MOUTH EVERY MORNING    OZEMPIC  1 mg/dose  (4 mg/3 mL) pen injector Inject 1 mg under the skin once a week Indications: type 2 diabetes mellitus.    pantoprazole  (PROTONIX ) 40 mg EC tablet Take 1 tablet (40 mg total) by mouth 2 (two) times a day.    pravastatin  (PRAVACHOL ) 40 mg tablet Take 1 tablet (40 mg total) by mouth daily Indications: excessive fat in the blood.    primidone (MYSOLINE) 50 mg tablet TAKE 1 TABLET BY MOUTH TWICE DAILY    sertraline  (ZOLOFT ) 100 mg tablet Take 1 tablet (100 mg total) by mouth in the morning.    meloxicam  (MOBIC ) 15 mg tablet Take 1 tablet (15 mg total) by mouth in the morning. (Patient not taking: Reported on 10/06/2024)    oxyCODONE-acetaminophen (PERCOCET) 5-325 mg per tablet One or two tablets every 6 hours as needed for pain (Patient not taking: Reported on 10/06/2024)    traMADoL (ULTRAM) 50 mg tablet Take 1 tablet (50 mg total) by mouth every 4 (four) hours as needed for pain for up to 30 doses. (Patient not taking: Reported on 10/06/2024)     No current facility-administered medications on file as of 10/06/2024. (Other)      Family History   Problem Relation Age of Onset    Stroke Mother     Atrial fibrillation Mother  COPD Father     Diabetes Father     Heart disease Father     Prostate cancer Father     Alcohol abuse Sister         accidental    Other Problem (accidental) Sister     Alcohol abuse Daughter     COPD Son     Colon cancer Maternal Uncle     Diabetes Paternal Grandmother     Alcohol abuse Sister     Bleeding Disorder Neg Hx     Clotting disorder Neg Hx     Anesthesia problems Neg Hx     Breast cancer Neg Hx     Ovarian cancer Neg Hx       Social History     Socioeconomic History    Marital status: Married     Spouse name: Not on file    Number of children: Not on file    Years of education: Not on file    Highest education level: Not on file   Occupational History    Not on file   Tobacco Use    Smoking status: Never    Smokeless tobacco: Never   Vaping Use     Vaping status: Never Used   Substance and Sexual Activity    Alcohol use: Yes     Alcohol/week: 4.0 standard drinks of alcohol     Types: 4 Drinks containing 0.5 oz of alcohol per week     Comment: 1 drink at dinner on Monday and Friday    Drug use: No    Sexual activity: Not Currently     Partners: Male   Other Topics Concern    Not on file   Social History Narrative    Not on file     Social Drivers of Health     Financial Resource Strain: Low Risk  (05/06/2023)    Received from [facility]    Overall Financial Resource Strain (CARDIA)     Difficulty of Paying Living Expenses: Not hard at all   Food Insecurity: No Food Insecurity (08/19/2024)    Received from [facility]    Hunger Vital Sign     Within the past 12 months, you worried that your food would run out before you got the money to buy more.: Never true     Within the past 12 months, the food you bought just didn't last and you didn't have money to get more.: Never true   Transportation Needs: No Transportation Needs (08/19/2024)    Received from [facility]    PRAPARE - Transportation     In the past 12 months, has lack of transportation kept you from medical appointments or from getting medications?: No     In the past 12 months, has lack of transportation kept you from meetings, work, or from getting things needed for daily living?: No   Physical Activity: Inactive (02/10/2024)    Received from [facility]    Exercise Vital Sign     On average, how many days per week do you engage in moderate to strenuous exercise (like a brisk walk)?: 0 days     On average, how many minutes do you engage in exercise at this level?: 0 min   Stress: Not on file   Social Connections: Not on file   Interpersonal Safety: Not on file   Housing Instability: Low Risk  (08/19/2024)  Received from [facility]    Housing Stability Vital Sign     In the last 12  months, was there a time when you were not able to pay the mortgage or rent on time?: No     In the past 12 months, how many times have you moved where you were living?: 0     At any time in the past 12 months, were you homeless or living in a shelter (including now)?: No         Ms. Odeh  has a past medical history of AAA (abdominal aortic aneurysm), Acid reflux, Allergic, Anxiety, Arthritis, Back pain, Breast disorder, Cataract, Dental disease, Depression, Diabetes mellitus type 2, controlled (CMS-HCC), DOE (dyspnea on exertion), Encephalitis (2014), Glaucoma, High cholesterol, Hypertension, Hypothyroidism, IBS (irritable bowel syndrome), Neuropathy, Neuropathy (12/04/2023), Obesity, Peripheral neuropathy, Plantar fasciitis, Pneumonia, Primary osteoarthritis of right knee (11/2023), Sleep apnea, Urinary urgency, Varicose vein of leg, Venous insufficiency, and Visual impairment. She  has a past surgical history that includes Carpal tunnel release (Right, 1983); Joint replacement (Left, 2013); Knee arthroscopy (Bilateral, 2007,  2010); Hernia repair (Left); Hysterectomy; Foot surgery (Right, 2004); Cataract extraction; Manipulation (Left, 03/17/2018); Breast surgery; Breast biopsy; Cardiac catheterization (2010); Colonoscopy; Eye surgery (April 24, 2021); Spine surgery (2019); Total knee arthroplasty (Right, 12/15/2023); Fracture surgery; Inguinal hernia repair; and Cosmetic surgery.    Base Eye Exam       Visual Acuity (Snellen - Linear)         Right Left    Dist sc 20/20 20/800    Dist ph sc  NI              Tonometry (Tonopen, 2:30 PM)         Right Left    Pressure 16 16              Pupils         Dark Light Shape APD    Right 4 3 Round     Left 4 5 Round +              Visual Fields (Counting fingers)         Right Left     Full     Restrictions  Partial outer superior nasal, inferior nasal deficiencies              Extraocular Movement         Right Left     Full Full              Neuro/Psych        Oriented x3: Yes    Mood/Affect: Normal              Dilation       Both eyes: 1.0% Tropicamide, 2.5% Phenylephrine Hydrochloride @ 2:30 PM                  Slit Lamp and Fundus Exam       External Exam         Right Left    External Normal, no proptosis Normal, no proptosis              Slit Lamp Exam         Right Left    Lids/Lashes Normal Normal    Conjunctiva/Sclera White and quiet White and quiet    Cornea Clear Clear    Anterior Chamber Deep and clear Deep and clear    Iris Round  and dilated Round and dilated    Lens Posterior chamber intraocular lens Posterior chamber intraocular lens              Fundus Exam         Right Left    Vitreous Posterior vitreous detachment S/p vitrectomy    Disc Normal Normal    C/D Ratio 0.35 0.4    Macula Normal Chronic large hole, Mild edema    Vessels Normal Normal    Periphery Normal, no retinal tear, no retinal hole, no retinal detachment Pigmentary changes nasally and temproally, no retinal tear, no retinal hole, no retinal detachment                  Diagnosis      1. Posterior vitreous detachment of right eye    2. Macular hole of left eye    3. Macular edema    4. History of vitrectomy    5. Amblyopia of left eye    6. Pseudophakia of both eyes    7. Type 2 diabetes mellitus without complication, without long-term current use of insulin (CMS-HCC)              1. Posterior vitreous detachment of right eye (Primary)  -Clinical examination confirmed the presence of a posterior vitreous detachment (PVD)   -Meticulous examination of the peripheral retina revealed no evidence of associated retinal tears, holes, or detachment  -Detailed counseling was provided on the symptoms indicative of a potential retinal tear or detachment (e.g., new flashes of light, a sudden shower of floaters, or the perception of a veil or curtain obscuring a portion of the visual field)  -Patient conveyed understanding and was explicitly instructed to contact the office immediately should any new  symptoms or acute vision changes develop     2. Macular hole of left eye  3. Macular edema  4. 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 Gweneth  -Testing performed today reviewed with patient   -Guarded visual prognosis; detailed discussion had regarding the diagnosis, its potential impact on central vision, and the risks, benefits, and alternatives of surgical repair versus observation  -After thorough consideration, patient elected to follow a course of watchful observation  -Extensive counseling provided on the critical importance of monocular precautions to safeguard the unaffected eye, given the monocular impairment  -Patient verbalized understanding of visual prognosis and safety recommendations   -Patient was instructed to diligently monitor their vision with an Amsler grid for any changes in metamorphopsia or scotoma  -Patient conveyed understanding and was explicitly instructed to contact the office immediately should any new visual changes or increased visual distortion occur     5. Amblyopia of left eye  -Established amblyopia diagnosis reported by patient  -Detailed discussion had with the patient regarding the inherent limitations in visual potential due to this neurodevelopmental disorder  -Patient understands visual potential is limited  -Extensive counseling provided on the critical importance of monocular precautions to safeguard the unaffected eye, given the monocular impairment  -Patient verbalized understanding of visual prognosis and safety recommendations     6. Pseudophakia of both eyes  -Clinical examination confirmed the presence of well-positioned posterior chamber intraocular lens implant(s), consistent with prior reported cataract surgery  -No significant post-surgical complications (e.g., lens subluxation,  opacification requiring immediate intervention) were noted at this visit  -Patient was  advised to continue regular follow-up with their primary eye care physician for routine post-surgical surveillance and general ocular health     7. Type 2 diabetes mellitus without complication, without long-term current use of insulin (CMS-HCC)  -No signs of diabetic retinopathy noted on exam/imaging  -Last A1C 6.2 taken 12/05/23  -Importance of blood sugar and blood pressure control stressed  -Recommended regular follow ups with PCP and/or endocrinologist   -Discussed possibility of developing diabetic retinopathy  -Letter sent to PCP and/or endocrinologist yearly       Patient Education: Questions were encouraged to stated satisfaction from the patient.  Discussed with patient that failure to follow up as recommended (appointment time, onset of new ocular symptoms) can lead to permanent loss of vision and/or blindness. Patient understands and agrees.    Return Visit: 6 month DFE/OCT/FP   Physician: Maire CHRISTELLA Legions, MD   Technician: Thersia Finder, COA  Scribed for and in the presence of Mandar CHRISTELLA Legions, MD by Thersia Finder, COA     Patient accompanied by Self.

## 2024-10-11 ENCOUNTER — Encounter

## 2024-10-11 MED ORDER — IRBESARTAN 150 MG PO TABS
150 | ORAL_TABLET | Freq: Every evening | ORAL | 3 refills | Status: AC
Start: 2024-10-11 — End: ?

## 2024-10-11 NOTE — Telephone Encounter (Signed)
"  Last Visit Date: 08/19/2024   Next Visit Date: 11/23/2024   Pharmacy: [facility] [place]    "

## 2024-10-19 ENCOUNTER — Encounter

## 2024-10-22 NOTE — Telephone Encounter (Signed)
"  Message from patient -    Nipple discharge - right sided.        Diagnosis Orders   1. Nipple discharge in female  MAM DIGITAL DIAGNOSTIC W OR WO CAD BILATERAL    US  BREAST COMPLETE RIGHT    [facility] [place], [place], [place], General Surgery, [place]        Orders placed. Needs imaging and clinical correlation.  Notified staff for [facility].  "

## 2024-11-08 ENCOUNTER — Ambulatory Visit: Admit: 2024-11-08 | Discharge: 2024-11-08 | Payer: Medicare (Managed Care) | Primary: Family Medicine

## 2024-11-08 NOTE — Progress Notes (Signed)
 Patient's Name/Date of Birth: Brandy [name] / August 07, 1946 (79 y.o.)    Date: November 08, 2024     HPI: Pt is a 79 y.o. female who presents to [facility] for evaluation of right breast nipple discharge. The discharge is clear and has been happening intermittently for one month. She has noted a small spot on her bra. The discharge is spontaneous. Pt has imaging ordered and it is scheduled to be done on 11/22/24 at [facility]. Pt has no family history of breast cancer.  On Saturday pt felt a severe pain in the right breast 12:00 position like being stabbed with needles. Pt had a benign concordant stereotactic biopsy of the right breast 12:00 position two years ago. Prior to that she had another stereotactic biopsy with an entry site at the 9:00 position, a clip was left and no surgery was recommended.     Pt has pending imaging.  Pain is piercing.      Past Medical History:   Diagnosis Date    Anxiety     Chronic back pain     Depression     DM (diabetes mellitus) (HCC)     Hay fever     Headache(784.0)     Hyperlipidemia     Hypertension     Irritable bowel syndrome     Neuropathy     Obesity     Osteoarthritis     Type 2 diabetes mellitus without complication     Unspecified sleep apnea     Urinary incontinence        Past Surgical History:   Procedure Laterality Date    BREAST LUMPECTOMY Right     CARDIAC CATHETERIZATION  2005,2010    CARPAL TUNNEL RELEASE Right 10/28/1981    CYST REMOVAL Right     x2    EYE SURGERY  Macular hole repair    HERNIA REPAIR Left 10/28/1988    femoral    HYSTERECTOMY (CERVIX STATUS UNKNOWN)  10/28/1989    HYSTERECTOMY, VAGINAL  1991    JOINT REPLACEMENT      KNEE ARTHROPLASTY Left     KNEE CARTILAGE SURGERY Right 10/29/1971    NEUROMA SURGERY Left 10/28/1988    hernia site    TOTAL KNEE ARTHROPLASTY Left 10/29/2011       Current Outpatient Medications   Medication Sig Dispense Refill    meloxicam  (MOBIC ) 15 MG tablet Take 1 tablet by mouth daily      irbesartan   (AVAPRO ) 150 MG tablet Take 1 tablet by mouth nightly 90 tablet 3    primidone (MYSOLINE) 50 MG tablet Take 1 tablet by mouth 2 times daily      sertraline  (ZOLOFT ) 100 MG tablet Take 1 tablet by mouth daily 90 tablet 3    dicyclomine  (BENTYL ) 10 MG capsule TAKE 1 CAPSULE BY MOUTH FOUR TIMES DAILY 120 capsule 2    gabapentin  (NEURONTIN ) 800 MG tablet Take 1 tablet by mouth 2 times daily.      levothyroxine  (SYNTHROID ) 150 MCG tablet Take 1 tablet by mouth daily 90 tablet 3    OZEMPIC , 1 MG/DOSE, 4 MG/3ML SOPN sc injection INJECT 1 MG INTO THE SKIN EVERY 7 DAYS 3 mL 5    pravastatin  (PRAVACHOL ) 40 MG tablet Take 1 tablet by mouth every evening 90 tablet 3    acetaminophen (TYLENOL) 500 MG tablet Take 2 tablets by mouth every 6 hours as needed      pantoprazole  (PROTONIX ) 40 MG tablet TAKE 1  TABLET BY MOUTH TWICE DAILY 180 tablet 3    Insulin Pen Needle (B-D ULTRAFINE III SHORT PEN) 31G X 8 MM MISC USE ONCE DAILY AS DIRECTED 100 each 3    aspirin 81 MG tablet Take 1 tablet by mouth daily      furosemide  (LASIX ) 20 MG tablet TAKE 1 TABLET BY MOUTH DAILY (Patient not taking: Reported on 11/08/2024) 90 tablet 1    pregabalin (LYRICA) 150 MG capsule Take 1 capsule by mouth 2 times daily. (Patient not taking: Reported on 11/08/2024)      Capsaicin 0.1 % CREA Apply 1 Application topically 3 times daily (Patient not taking: Reported on 11/08/2024)       No current facility-administered medications for this visit.       Allergies   Allergen Reactions    Ciprofloxacin  Itching     IV cipro  only    Meperidine      Other reaction(s): Hallucinations    Meperidine Hcl      Other reaction(s): Unknown    Sulfacetamide     Tetanus Antitoxin      Other reaction(s): Other allergic reaction    Tetanus Toxoid-Containing Vaccines        Family History   Problem Relation Age of Onset    Osteoporosis Mother     Arthritis Mother     Stroke Mother     Cancer Father         Prostate         Review of Systems:   Constitutional:  Negative for  chills, fatigue, fever and unexpected weight change.   Eyes:  Negative for visual disturbance.   Respiratory:  Negative for cough, chest tightness, shortness of breath and wheezing.    Cardiovascular:  Negative for chest pain, palpitations and leg swelling.   Gastrointestinal:  Negative for abdominal distention, abdominal pain, blood in stool, constipation, diarrhea, nausea and vomiting.   Genitourinary:  Negative for dysuria, hematuria and urgency.   Musculoskeletal:  Negative for back pain, neck pain and neck stiffness.   Skin:  Negative for rash and wound.   Neurological:  Negative for syncope, weakness, light-headedness and headaches.   Hematological:  Negative for adenopathy. Does not bruise/bleed easily.   Psychiatric/Behavioral:  Negative for suicidal ideas. The patient is not nervous/anxious.      Physical Exam:  Vitals:    11/08/24 1324   BP: (!) 142/70   Pulse:      General:A & O x3  HEENT:  NCAT, no mass palpated on neck exam    BREAST: The breasts are symmetric.   There are no surgical scars.  Breast Mass: no  There is normal tissue throughout.  There is no nipple inversion. There is pain, but no nipple discharge with gentle pressure of the right nipple.  There is no retraction.  No cervical, supraclavicular or axillary lymphadenopathy noted.    Heart: S1+S2, no mumurs, RRR  Lungs: clear to auscultation without wheezes or rales  Abdomen: soft, nontender, no guarding, no rebound, no masses, no hernias  Extremity: Normal, without deformities, edema, or skin discoloration. No arm lymphedema.  SKIN: Skin color, texture, turgor normal. No rashes or lesions.    IMAGING:  Mammogram Result (most recent):  MAM DIGITAL DIAGNOSTIC W OR WO CAD RIGHT 10/09/2022    Narrative  MAMM POST BX DIAG UNI RT    UNILATERAL RIGHT DIGITAL DIAGNOSTIC MAMMOGRAM POST-NEEDLE BIOPSY:  10/09/2022  CLINICAL: Post Biopsy Mammogram.    Comparison is made to exams  dated: 09/27/2022 mammogram - [facility], 09/06/2022 mammogram -  [facility],  03/30/2020 mammogram, and 10/09/2022 stereotactic biopsy - [facility].        IMPRESSION: POST PROCEDURE MAMMOGRAMS FOR MARKER PLACEMENT  SEE SEPARATE STEREOTACTIC BIOPSY REPORT.    Grayce Dux M.D.  rs/:10/09/2022 14:17:27      letter sent: Post BX Mammo Biopsy Clip  Mammogram BI-RADS: Post Procedure Mammograms for Marker Placement     Ultrasound Result (most recent):  US  DUP LOWER EXTREMITIES BILATERAL VENOUS 05/11/2018    Narrative  Previous: Previous lower extremity venous insufficiency exam performed 05/12/2017: Bilateral great saphenous, superficial vein reflux.  Right: Limited visualization of peroneal veins in the calf due to body habitus. Remaining visualized venous segments are compressible with spontaneous phasic spectral Doppler waveforms and good augmentation. Venous reflux time is less than 1000 msec in  all evaluated deep veins. Multilevel great saphenous vein reflux time >500 msec. Lateral accessory saphenous vein with 601 msec reflux time in the proximal medial thigh and diameter of 3.3 mm.  Left: Non-visualization of peroneal veins in the calf due to body habitus. Remaining visualized venous segments are compressible with spontaneous phasic spectral Doppler waveforms and good augmentation. Venous reflux time is less than 1000 msec in all  evaluated deep veins. Great saphenous vein with 850 msec reflux time in the mid anteromedial calf . Lateral accessory saphenous vein with 641 msec reflux time in the proximal anterolateral thigh and diameter of 5.0 mm. Superficial accessory saphenous  varicosities noted in the proximal anterolateral thigh .  Conclusions: RIGHT:   Limited visualization of lower extremity venous segments.   No evidence of deep or superficial vein thrombosis of the lower extremity.   No evidence of deep venous reflux.   Great saphenous, superficial vein reflux.   Accessory  great saphenous, superficial vein reflux.   LEFT:    Non-visualization of lower extremity venous segments.   No evidence of deep or superficial vein thrombosis of the lower extremity.   No evidence of deep venous reflux.   Great saphenous, superficial  vein reflux.   Accessory great saphenous, superficial vein reflux.  Recommendations: Any questions prior to finalization, please call the reading physician during normal business hours at the phone number beside their name.       Assessment/Plan:       ICD-10-CM    1. Discharge from right nipple  N64.52       2. Breast pain, right  N64.4          Brandy [name] is a 79 y.o. female that is seen today for clear right nipple discharge with history of two stereotactic biopsies and pain in the right breast.    Pt will undergo scheduled imaging and then return to see me after the imaging has been completed and interpreted.    Imaging was reviewed with the patient, all questions were answered and pt agreed to proceed with the plan as described.     Return in about 24 days (around 12/02/2024) for Nipple discharge (check for mammogram and us ).     Sherrilyn Alderman, MD  11/08/2024 1:31 PM

## 2024-11-16 ENCOUNTER — Encounter

## 2024-11-16 MED ORDER — OZEMPIC (1 MG/DOSE) 4 MG/3ML SC SOPN
4 | SUBCUTANEOUS | 5 refills | 28.00000 days | Status: AC
Start: 2024-11-16 — End: ?

## 2024-11-16 NOTE — Telephone Encounter (Signed)
 Patient called this morning stating that the pharmacy informed her the provider had refused the Ozempic  prescription. Writer did not see any recent refill request in the chart.    Writer informed patient that a refill request would be initiated and sent to her provider for approval.

## 2024-11-16 NOTE — Telephone Encounter (Signed)
"  Last Visit Date: 08/19/2024   Next Visit Date: 11/23/2024     "

## 2024-11-16 NOTE — Telephone Encounter (Signed)
 Patient notified

## 2024-11-22 ENCOUNTER — Encounter

## 2024-11-22 ENCOUNTER — Ambulatory Visit

## 2024-11-22 ENCOUNTER — Inpatient Hospital Stay: Admit: 2024-11-22 | Payer: Medicare (Managed Care) | Attending: Family Medicine | Primary: Family Medicine

## 2024-11-22 VITALS — Ht 66.0 in | Wt 253.0 lb

## 2024-11-22 DIAGNOSIS — N6452 Nipple discharge: Secondary | ICD-10-CM

## 2024-11-22 MED ORDER — PANTOPRAZOLE SODIUM 40 MG PO TBEC
40 | ORAL_TABLET | Freq: Two times a day (BID) | ORAL | 3 refills | Status: AC
Start: 2024-11-22 — End: ?

## 2024-11-22 NOTE — Progress Notes (Signed)
 Called to discussed mammogram / ultrasound results.    IMPRESSION:  1.  Small hypoechoic mass in the right breast 6 o'clock axis retroareolar  region measuring 0.5 cm likely intraductal in etiology.  Recommend  ultrasound-guided core needle biopsy for further evaluation.     2.  No mammographic evidence of malignancy in the left breast.     BI-RADS 4     BIRADS:  BIRADS - CATEGORY 4     Suspicious Abnormality. Biopsy should be considered at this time.       Brandy [name] confirmed that she has a Feb. 5 appt set up for biopsy.  Under care with Dr Catheline.    Brandy [name] requests refill for pantoprazole . States that her pharmacy said she had no refills remaining. Medlist confirms original order for pantoprazole  placed on 07-02-2025 90d RF3 was on file. Reorder placed for same.    Electronically signed by Mabel KANDICE Agreste, DO on 11/22/2024 at 5:06 PM

## 2024-11-22 NOTE — Progress Notes (Signed)
 [name]  [name] [name], [name]  Brandy [name] is scheduled for a right breast ultrasound biopsy on 12/02/24. Please place order in EPIC for us  to proceed. Thank you.    [id number]    ________________________________________     Diagnosis Orders   1. Discharge from right nipple  US  BREAST BIOPSY W LOC DEVICE 1ST LESION RIGHT      2. Breast pain, right  US  BREAST BIOPSY W LOC DEVICE 1ST LESION RIGHT      3. Breast lesion  US  BREAST BIOPSY W LOC DEVICE 1ST LESION RIGHT

## 2024-11-23 ENCOUNTER — Ambulatory Visit: Payer: Medicare (Managed Care) | Attending: Family Medicine | Primary: Family Medicine

## 2024-12-02 ENCOUNTER — Encounter: Payer: Medicare (Managed Care) | Primary: Family Medicine

## 2024-12-02 ENCOUNTER — Encounter

## 2024-12-02 ENCOUNTER — Inpatient Hospital Stay: Admit: 2024-12-02 | Payer: Medicare (Managed Care) | Primary: Family Medicine

## 2024-12-02 DIAGNOSIS — N6452 Nipple discharge: Principal | ICD-10-CM

## 2024-12-02 DIAGNOSIS — Z9889 Other specified postprocedural states: Principal | ICD-10-CM

## 2024-12-02 MED FILL — SODIUM BICARBONATE 8.4 % IV SOLN: 8.4 % | INTRAVENOUS | Qty: 2.1

## 2024-12-02 MED FILL — XYLOCAINE/EPINEPHRINE 2 %-1:100000 IJ SOLN: 2 %-1:100000 | INTRAMUSCULAR | Qty: 20

## 2024-12-02 MED FILL — XYLOCAINE 2 % IJ SOLN: 2 % | INTRAMUSCULAR | Qty: 20

## 2024-12-16 ENCOUNTER — Encounter: Payer: Medicare (Managed Care) | Primary: Family Medicine
